# Patient Record
Sex: Female | Born: 1935 | Race: White | Hispanic: No | Marital: Married | Smoking: Former smoker
Health system: Southern US, Community
[De-identification: ages and names within clinical notes are randomized; demographics above are authoritative.]

## PROBLEM LIST (undated history)

## (undated) DIAGNOSIS — J9610 Chronic respiratory failure, unspecified whether with hypoxia or hypercapnia: Secondary | ICD-10-CM

## (undated) DIAGNOSIS — H539 Unspecified visual disturbance: Secondary | ICD-10-CM

## (undated) DIAGNOSIS — J449 Chronic obstructive pulmonary disease, unspecified: Secondary | ICD-10-CM

## (undated) DIAGNOSIS — I739 Peripheral vascular disease, unspecified: Secondary | ICD-10-CM

## (undated) DIAGNOSIS — C439 Malignant melanoma of skin, unspecified: Secondary | ICD-10-CM

## (undated) DIAGNOSIS — I10 Essential (primary) hypertension: Secondary | ICD-10-CM

## (undated) DIAGNOSIS — M19079 Primary osteoarthritis, unspecified ankle and foot: Secondary | ICD-10-CM

## (undated) DIAGNOSIS — G500 Trigeminal neuralgia: Secondary | ICD-10-CM

## (undated) HISTORY — PX: APPENDECTOMY: SHX54

## (undated) HISTORY — PX: MASTECTOMY: SHX3

## (undated) HISTORY — PX: CHOLECYSTECTOMY: SHX55

## (undated) HISTORY — DX: Essential (primary) hypertension: I10

## (undated) HISTORY — DX: Chronic obstructive pulmonary disease, unspecified: J44.9

## (undated) HISTORY — PX: TOTAL ABDOMINAL HYSTERECTOMY: SHX209

## (undated) HISTORY — DX: Unspecified visual disturbance: H53.9

## (undated) HISTORY — DX: Malignant melanoma of skin, unspecified: C43.9

## (undated) HISTORY — DX: Primary osteoarthritis, unspecified ankle and foot: M19.079

---

## 1999-06-11 ENCOUNTER — Ambulatory Visit (HOSPITAL_COMMUNITY): Admission: RE | Admit: 1999-06-11 | Discharge: 1999-06-11 | Payer: Self-pay | Admitting: Specialist

## 2004-12-04 ENCOUNTER — Ambulatory Visit: Payer: Self-pay | Admitting: Critical Care Medicine

## 2005-01-23 ENCOUNTER — Ambulatory Visit: Payer: Self-pay | Admitting: Critical Care Medicine

## 2005-01-28 ENCOUNTER — Ambulatory Visit: Payer: Self-pay | Admitting: Critical Care Medicine

## 2005-05-14 ENCOUNTER — Ambulatory Visit: Payer: Self-pay | Admitting: Critical Care Medicine

## 2005-05-28 ENCOUNTER — Ambulatory Visit: Payer: Self-pay | Admitting: Cardiology

## 2005-06-12 ENCOUNTER — Ambulatory Visit: Payer: Self-pay | Admitting: Critical Care Medicine

## 2005-09-30 ENCOUNTER — Ambulatory Visit: Payer: Self-pay | Admitting: Critical Care Medicine

## 2006-02-19 ENCOUNTER — Ambulatory Visit: Payer: Self-pay | Admitting: Critical Care Medicine

## 2006-04-09 ENCOUNTER — Ambulatory Visit: Payer: Self-pay | Admitting: Critical Care Medicine

## 2006-05-08 ENCOUNTER — Ambulatory Visit: Payer: Self-pay | Admitting: Critical Care Medicine

## 2006-05-23 ENCOUNTER — Ambulatory Visit: Payer: Self-pay | Admitting: Critical Care Medicine

## 2006-05-23 ENCOUNTER — Ambulatory Visit: Payer: Self-pay | Admitting: Internal Medicine

## 2006-06-03 ENCOUNTER — Ambulatory Visit: Payer: Self-pay | Admitting: Critical Care Medicine

## 2006-06-23 ENCOUNTER — Ambulatory Visit: Payer: Self-pay | Admitting: Critical Care Medicine

## 2006-08-08 ENCOUNTER — Ambulatory Visit: Payer: Self-pay | Admitting: Critical Care Medicine

## 2006-09-30 ENCOUNTER — Ambulatory Visit: Payer: Self-pay | Admitting: Critical Care Medicine

## 2007-05-13 DIAGNOSIS — J440 Chronic obstructive pulmonary disease with acute lower respiratory infection: Secondary | ICD-10-CM

## 2007-05-13 DIAGNOSIS — J209 Acute bronchitis, unspecified: Secondary | ICD-10-CM | POA: Insufficient documentation

## 2007-09-17 ENCOUNTER — Ambulatory Visit: Payer: Self-pay | Admitting: Critical Care Medicine

## 2007-09-17 DIAGNOSIS — I10 Essential (primary) hypertension: Secondary | ICD-10-CM

## 2007-09-17 DIAGNOSIS — C439 Malignant melanoma of skin, unspecified: Secondary | ICD-10-CM | POA: Insufficient documentation

## 2007-09-17 DIAGNOSIS — J309 Allergic rhinitis, unspecified: Secondary | ICD-10-CM | POA: Insufficient documentation

## 2007-09-19 ENCOUNTER — Encounter: Payer: Self-pay | Admitting: Critical Care Medicine

## 2007-11-02 ENCOUNTER — Encounter: Payer: Self-pay | Admitting: Critical Care Medicine

## 2007-11-02 ENCOUNTER — Ambulatory Visit: Payer: Self-pay | Admitting: Critical Care Medicine

## 2008-03-24 ENCOUNTER — Ambulatory Visit: Payer: Self-pay | Admitting: Critical Care Medicine

## 2008-03-24 DIAGNOSIS — R5381 Other malaise: Secondary | ICD-10-CM | POA: Insufficient documentation

## 2008-03-28 ENCOUNTER — Encounter: Payer: Self-pay | Admitting: Critical Care Medicine

## 2008-04-11 ENCOUNTER — Encounter: Payer: Self-pay | Admitting: Critical Care Medicine

## 2008-05-16 ENCOUNTER — Ambulatory Visit: Payer: Self-pay | Admitting: Critical Care Medicine

## 2008-08-15 ENCOUNTER — Ambulatory Visit: Payer: Self-pay | Admitting: Critical Care Medicine

## 2008-09-01 ENCOUNTER — Encounter: Payer: Self-pay | Admitting: Critical Care Medicine

## 2008-11-17 ENCOUNTER — Ambulatory Visit: Payer: Self-pay | Admitting: Critical Care Medicine

## 2009-01-18 ENCOUNTER — Encounter: Payer: Self-pay | Admitting: Critical Care Medicine

## 2009-01-19 ENCOUNTER — Encounter: Payer: Self-pay | Admitting: Critical Care Medicine

## 2009-03-02 ENCOUNTER — Ambulatory Visit: Payer: Self-pay | Admitting: Critical Care Medicine

## 2009-06-01 ENCOUNTER — Ambulatory Visit: Payer: Self-pay | Admitting: Critical Care Medicine

## 2009-10-12 ENCOUNTER — Ambulatory Visit: Payer: Self-pay | Admitting: Critical Care Medicine

## 2009-10-12 ENCOUNTER — Ambulatory Visit: Payer: Self-pay | Admitting: Radiology

## 2009-10-12 ENCOUNTER — Ambulatory Visit (HOSPITAL_BASED_OUTPATIENT_CLINIC_OR_DEPARTMENT_OTHER): Admission: RE | Admit: 2009-10-12 | Discharge: 2009-10-12 | Payer: Self-pay | Admitting: Critical Care Medicine

## 2009-10-12 DIAGNOSIS — R519 Headache, unspecified: Secondary | ICD-10-CM | POA: Insufficient documentation

## 2009-10-12 DIAGNOSIS — R51 Headache: Secondary | ICD-10-CM

## 2009-12-26 ENCOUNTER — Ambulatory Visit: Payer: Self-pay | Admitting: Critical Care Medicine

## 2010-01-02 ENCOUNTER — Ambulatory Visit: Payer: Self-pay | Admitting: Critical Care Medicine

## 2010-01-15 ENCOUNTER — Telehealth (INDEPENDENT_AMBULATORY_CARE_PROVIDER_SITE_OTHER): Payer: Self-pay | Admitting: *Deleted

## 2010-01-25 ENCOUNTER — Ambulatory Visit: Payer: Self-pay | Admitting: Critical Care Medicine

## 2010-04-18 ENCOUNTER — Telehealth (INDEPENDENT_AMBULATORY_CARE_PROVIDER_SITE_OTHER): Payer: Self-pay | Admitting: *Deleted

## 2010-05-10 ENCOUNTER — Ambulatory Visit: Payer: Self-pay | Admitting: Critical Care Medicine

## 2010-05-10 DIAGNOSIS — G500 Trigeminal neuralgia: Secondary | ICD-10-CM | POA: Insufficient documentation

## 2010-07-02 ENCOUNTER — Ambulatory Visit: Payer: Self-pay | Admitting: Pulmonary Disease

## 2010-07-02 ENCOUNTER — Telehealth (INDEPENDENT_AMBULATORY_CARE_PROVIDER_SITE_OTHER): Payer: Self-pay | Admitting: *Deleted

## 2010-07-02 DIAGNOSIS — J441 Chronic obstructive pulmonary disease with (acute) exacerbation: Secondary | ICD-10-CM

## 2010-07-04 LAB — CONVERTED CEMR LAB
BUN: 20 mg/dL (ref 6–23)
CO2: 29 meq/L (ref 19–32)
Calcium: 9.4 mg/dL (ref 8.4–10.5)
Chloride: 96 meq/L (ref 96–112)
Creatinine, Ser: 0.7 mg/dL (ref 0.4–1.2)
GFR calc non Af Amer: 85.37 mL/min (ref >60.00)
Glucose, Bld: 106 mg/dL — ABNORMAL HIGH (ref 70–99)
Potassium: 4.2 meq/L (ref 3.5–5.1)
Sodium: 135 meq/L (ref 135–145)

## 2010-07-17 ENCOUNTER — Ambulatory Visit: Payer: Self-pay | Admitting: Pulmonary Disease

## 2010-08-30 NOTE — Assessment & Plan Note (Signed)
Summary: 2 wk follow up in [place] / [facility]   Visit Type:  NP follow up Primary Otho Michalik/Referring Kashawna Manzer:  Ronald Lobo in [place]  CC:  2 week NP follow up. Pt c/o wheezing, chest tightness, sore throat, SOB and right foot swelling. Needs refills on ProAir, and and Nasonex .  History of Present Illness: 77 -year-old, white female with history of chronic obstructive lung disease with asthmatic bronchitis and emphysematous component.    Gold Stage III  October 12, 2009 12:21 PM since last ov .  doing ok,  fell over christmas and busted head open.  had a coughing virus.  had to take two rounds of abx  zpak then a fluoroquinolone.   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  No fever.   Was dx with trigeminal neuralgia.   Tx w/ avelox and steroid taper.   July 02, 2010 5:13 PM  - Acute OV head cold x 3 ds with cough, dry, wheezing, increased dyspnea, feels dizzy Bp low , taking extra potassium, h/o falll in the past CXR no infx, reviewed - allergic to PCN & doxy  July 17, 2010--Returns today for follow up. Recent COPD flare , tx zithromax and steroid taper 2 weeks ago. CXR was w/ chronic changes, BMET unrevealing. Today she is feeling better not back to baseline yet. Has some post nasal drip at times w/ am sore throat. B/P had been on low side, b/p meds were held for 3 days, b/p improved and she restarted her meds without trouble.  Denies chest pain,  orthopnea, hemoptysis, fever, n/v/d, edema, headache.   Current Medications (verified): 1)  Nexium 40 Mg  Cpdr (Esomeprazole Magnesium) .... One By Mouth Once Daily 2)  Requip 2 Mg  Tabs (Ropinirole Hcl) .... One By Mouth At Bedtime 3)  Klor-Con 10 10 Meq  Tbcr (Potassium Chloride) .... 2 Tabs Once Daily 4)  Spiriva Handihaler 18 Mcg  Caps (Tiotropium Bromide Monohydrate) .... One Capsule in  Handihaler Once Daily 5)  Symbicort 160-4.5 Mcg/act  Aero (Budesonide-Formoterol Fumarate) .... Two Puffs Twice Daily 6)  Nasonex 50 Mcg/act  Susp (Mometasone Furoate) .... Two Puff Ea Nostril Once Daily 7)  Foltx 2.5-25-2 Mg  Tabs (Fa-Pyridoxine-Cyancobalamin) .... Once Daily 8)  Vivelle-Dot 0.1 Mg/24hr  Pttw (Estradiol) .... Apply Twice A Week 9)  Lipitor 10 Mg Tabs (Atorvastatin Calcium) .... Take 1 Tablet By Mouth Once A Day 10)  Lasix 20 Mg  Tabs (Furosemide) .... Take 1 Tablet By Mouth Once A Day 11)  Losartan Potassium-Hctz 100-25 Mg Tabs (Losartan Potassium-Hctz) .... One By Mouth Daily 12)  Hyoscyamine Sulfate 0.125 Mg Tabs (Hyoscyamine Sulfate) .... Take 1 Tab By Mouth Once Daily As Needed For Abdominal Cramps 13)  Meclizine Hcl 25 Mg Tabs (Meclizine Hcl) .... As Needed 14)  Lidoderm 5 % Ptch (Lidocaine) .... Use As Needed 15)  Oxycodone-Acetaminophen 5-325 Mg Tabs (Oxycodone-Acetaminophen) .... As Needed  Allergies (verified): 1)  ! Penicillin 2)  ! Doxycycline  Past History:  Past Medical History: Last updated: 03/24/2008 Current Problems:  MALIGNANT MELANOMA, SKIN (ICD-172.9) no recurrence on leg  HYPERTENSION (ICD-401.9) COPD (ICD-496)   -FeV1 51% 4/09 Allergic Rhinitis  Past Surgical History: Last updated: 09/17/2007 Appendectomy Breast Surgery: bilateral mastectomy 1980s Cholecystectomy Total Abdominal Hysterectomy  Family History: Last updated: 09/17/2007 Diabetes  Alzheimers disease  Social History: Last updated: 09/17/2007 Patient states former smoker.   Risk Factors: Smoking Status: quit > 6 months (07/02/2010)  Review of Systems      See HPI  Vital Signs:  Patient profile:   77 year old female Height:      62 inches Weight:      123 pounds BMI:     4.22 O2 Sat:      99 % on Room air Temp:     98.4 degrees F oral Pulse rate:   98 / minute BP sitting:   110 / 70  (left arm) Cuff size:   regular  Vitals Entered By: Zackery Barefoot CMA  (July 17, 2010 11:15 AM)  O2 Flow:  Room air CC: 2 week NP follow up. Pt c/o wheezing, chest tightness, sore throat, SOB and right foot swelling. Needs refills on ProAir, and Nasonex  Comments Medications reviewed with patient Verified contact number and pharmacy with patient Zackery Barefoot CMA  July 17, 2010 11:15 AM    Physical Exam  Additional Exam:  Gen: Pleasant, well nourished, in no distress ENT: no lesions,pale nasal mucosa, post pharynx clear.  Neck: No JVD, no TMG, no carotid bruits Lungs: no use of accessory muscles, no dullness to percussion, diminshed BS in bases  Cardiovascular: RRR, heart sounds normal, no murmurs or gallops, no peripheral edema Musculoskeletal: No deformities, no cyanosis or clubbing     Impression & Recommendations:  Problem # 1:  C O P D WITH ACUTE EXACERBATION (ICD-491.21)  Recent flare now resolved.  cont on same meds.   Orders: Est. Patient Level II (60454)  Problem # 2:  HYPERTENSION (ICD-401.9)  controlled on rx.  Her updated medication list for this problem includes:    Lasix 20 Mg Tabs (Furosemide) .Marland Kitchen... Take 1 tablet by mouth once a day    Losartan Potassium-hctz 100-25 Mg Tabs (Losartan potassium-hctz) ..... One by mouth daily  BP today: 110/70 Prior BP: 90/70 (07/02/2010)  Labs Reviewed: K+: 4.2 (07/02/2010) Creat: : 0.7 (07/02/2010)     Orders: Est. Patient Level II (09811)  Medications Added to Medication List This Visit: 1)  Proair Hfa 108 (90 Base) Mcg/act Aers (Albuterol sulfate) .... 2 puffs every 4-6 hrs as needed wheezing  Complete Medication List: 1)  Nexium 40 Mg Cpdr (Esomeprazole magnesium) .... One by mouth once daily 2)  Requip 2 Mg Tabs (Ropinirole hcl) .... One by mouth at bedtime 3)  Klor-con 10 10 Meq Tbcr (Potassium chloride) .... 2 tabs once daily 4)  Spiriva Handihaler 18 Mcg Caps (Tiotropium bromide monohydrate) .... One capsule in handihaler once daily 5)  Symbicort 160-4.5 Mcg/act  Aero (Budesonide-formoterol fumarate) .... Two puffs twice daily 6)  Nasonex 50 Mcg/act Susp (Mometasone furoate) .... Two puff ea nostril once daily 7)  Foltx 2.5-25-2 Mg Tabs (Fa-pyridoxine-cyancobalamin) .... Once daily 8)  Vivelle-dot 0.1 Mg/24hr Pttw (Estradiol) .... Apply twice a week 9)  Lipitor 10 Mg Tabs (Atorvastatin calcium) .... Take 1 tablet by mouth once a day 10)  Lasix 20 Mg Tabs (Furosemide) .... Take 1 tablet by mouth once a day 11)  Losartan Potassium-hctz 100-25 Mg Tabs (Losartan potassium-hctz) .... One by mouth daily 12)  Hyoscyamine Sulfate 0.125 Mg Tabs (Hyoscyamine sulfate) .... Take 1 tab by mouth once daily as needed for abdominal cramps 13)  Meclizine Hcl 25 Mg Tabs (Meclizine hcl) .... As needed 14)  Lidoderm 5 % Ptch (Lidocaine) .... Use as needed 15)  Oxycodone-acetaminophen 5-325 Mg  Tabs (Oxycodone-acetaminophen) .... As needed 16)  Proair Hfa 108 (90 Base) Mcg/act Aers (Albuterol sulfate) .... 2 puffs every 4-6 hrs as needed wheezing  Patient Instructions: 1)  Continue on same meds.  2)  follow up Dr. Vassie Loll in 3 months  and as needed -in [facility]  Prescriptions: PROAIR HFA 108 (90 BASE) MCG/ACT AERS (ALBUTEROL SULFATE) 2 puffs every 4-6 hrs as needed wheezing  #3 x 1   Entered and Authorized by:   Rubye Oaks NP   Signed by:   Rubye Oaks NP on 07/17/2010   Method used:   Electronically to        [facility] [phone]* (retail)       [address] [place], [place]  [address]       Ph: [phone] or [phone]       Fax: [phone]   RxID:   [id number] NASONEX 50 MCG/ACT  SUSP (MOMETASONE FUROATE) two puff ea nostril once daily  #3 x 3   Entered and Authorized by:   Rubye Oaks NP   Signed by:   Rubye Oaks NP on 07/17/2010   Method used:   Electronically to        [facility] [address] (retail)       [address] [place], [place]  [address]       Ph: [phone] or [phone]       Fax: [phone]   RxID:    [id number]

## 2010-08-30 NOTE — Assessment & Plan Note (Signed)
Summary: NP follow up - bronchitis   Primary Provider/Referring Provider:  Ronald Lobo in [place]  CC:  1 week follow up - states is better but still having nasal/sinus congestion and prod cough with light yellow mucus.  History of Present Illness: 77 -year-old, white female with history of chronic obstructive lung disease with asthmatic bronchitis and emphysematous component.     October 12, 2009 12:21 PM since last ov .  doing ok,  fell over christmas and busted head open.  had a coughing virus.  had to take two rounds of abx  zpak then a fluoroquinolone.   Ivin Booty was the surgeon  had a large laceration on the forehead,   passed out and hit the stool not much cough now.  no real wheeze,  no congestion now has pain in facial in R maxillary bone   Dec 26, 2009 11:11 AM This pt is more dyspneic for one week.  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   Now notes more cough, and not able to move, in bed for 3 days Note pt is on an  ace inhibitor,  notes yellow and clear mucus.  No fever.   Was dx with trigeminal neuralgia.    January 02, 2010--Presents for 1 week follow up - states is better but still having nasal/sinus congestion, prod cough with light yellow mucus.  Last vist ace inhibitor stopped. Tx w/ avelox and steroid taper. She is feeling much better but has not fully recoverd yet. Changed to losartan 50mg  -tolerating well w/ good b/p control. Denies chest pain, dyspnea, orthopnea, hemoptysis, fever, n/v/d, edema, headache. October 12, 2009 12:21 PM since last ov .  doing ok,  fell over christmas and busted head open.  had a coughing virus.  had to take two rounds of abx  zpak then a fluoroquinolone.   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  no real wheeze,  no congestion now  has pain in facial in R maxillary bone    Medications Prior to  Update: 1)  Nexium 40 Mg  Cpdr (Esomeprazole Magnesium) .... One By Mouth Once Daily 2)  Requip 2 Mg  Tabs (Ropinirole Hcl) .... One By Mouth At Bedtime 3)  Klor-Con 10 10 Meq  Tbcr (Potassium Chloride) .... 2 Tabs Once Daily 4)  Spiriva Handihaler 18 Mcg  Caps (Tiotropium Bromide Monohydrate) .... One Capsule in Handihaler Once Daily 5)  Symbicort 160-4.5 Mcg/act  Aero (Budesonide-Formoterol Fumarate) .... Two Puffs Twice Daily 6)  Nasonex 50 Mcg/act  Susp (Mometasone Furoate) .... Two Puff Ea Nostril Once Daily 7)  Meloxicam 7.5 Mg  Tabs (Meloxicam) .... Daily As Needed 8)  Foltx 2.5-25-2 Mg  Tabs (Fa-Pyridoxine-Cyancobalamin) .... Once Daily 9)  Vivelle-Dot 0.1 Mg/24hr  Pttw (Estradiol) .... Apply Twice A Week 10)  Lipitor 10 Mg Tabs (Atorvastatin Calcium) .... Take 1 Tablet By Mouth Once A Day 11)  Hydrochlorothiazide 25 Mg  Tabs (Hydrochlorothiazide) .... Once Daily 12)  Meclizine Hcl 25 Mg Tabs (Meclizine Hcl) .... As Needed 13)  Losartan Potassium 50 Mg Tabs (Losartan Potassium) .... One By Mouth Daily 14)  Oxycodone-Acetaminophen 5-325 Mg Tabs (Oxycodone-Acetaminophen) .... As Needed 15)  Lidoderm 5 % Ptch (Lidocaine) .... Use As Needed 16)  Prednisone 10 Mg  Tabs (Prednisone) .... Take As Directed 4 Each  Am X 4 Days, 3 X 4 Days, 2 X 4 Days, 1 X 4 Days Then Stop  Current Medications (verified): 1)  Nexium 40 Mg  Cpdr (Esomeprazole Magnesium) .... One By Mouth Once Daily 2)  Requip 2 Mg  Tabs (Ropinirole Hcl) .... One By Mouth At Bedtime 3)  Klor-Con 10 10 Meq  Tbcr (Potassium Chloride) .... 2 Tabs Once Daily 4)  Spiriva Handihaler 18 Mcg  Caps (Tiotropium Bromide Monohydrate) .... One Capsule in Handihaler Once Daily 5)  Symbicort 160-4.5 Mcg/act  Aero (Budesonide-Formoterol Fumarate) .... Two Puffs Twice Daily 6)  Nasonex 50 Mcg/act  Susp (Mometasone Furoate) .... Two Puff Ea Nostril Once Daily 7)  Meloxicam 7.5 Mg  Tabs (Meloxicam) .... Daily As Needed 8)  Foltx 2.5-25-2 Mg  Tabs  (Fa-Pyridoxine-Cyancobalamin) .... Once Daily 9)  Vivelle-Dot 0.1 Mg/24hr  Pttw (Estradiol) .... Apply Twice A Week 10)  Lipitor 10 Mg Tabs (Atorvastatin Calcium) .... Take 1 Tablet By Mouth Once A Day 11)  Hydrochlorothiazide 25 Mg  Tabs (Hydrochlorothiazide) .... Once Daily 12)  Meclizine Hcl 25 Mg Tabs (Meclizine Hcl) .... As Needed 13)  Losartan Potassium 50 Mg Tabs (Losartan Potassium) .... One By Mouth Daily 14)  Oxycodone-Acetaminophen 5-325 Mg Tabs (Oxycodone-Acetaminophen) .... As Needed 15)  Lidoderm 5 % Ptch (Lidocaine) .... Use As Needed 16)  Prednisone 10 Mg  Tabs (Prednisone) .... Take As Directed 4 Each Am X 4 Days, 3 X 4 Days, 2 X 4 Days, 1 X 4 Days Then Stop  Allergies (verified): 1)  ! Penicillin 2)  ! Doxycycline  Past History:  Past Medical History: Last updated: 03/24/2008 Current Problems:  MALIGNANT MELANOMA, SKIN (ICD-172.9) no recurrence on leg  HYPERTENSION (ICD-401.9) COPD (ICD-496)   -FeV1 51% 4/09 Allergic Rhinitis  Past Surgical History: Last updated: 09/17/2007 Appendectomy Breast Surgery: bilateral mastectomy 1980s Cholecystectomy Total Abdominal Hysterectomy  Family History: Last updated: 09/17/2007 Diabetes Alzheimers disease  Social History: Last updated: 09/17/2007 Patient states former smoker.   Risk Factors: Smoking Status: quit > 6 months (12/26/2009)  Review of Systems      See HPI  Vital Signs:  Patient profile:   77 year old female Height:      62 inches Weight:      119 pounds BMI:     21.84 O2 Sat:      95 % on Room air Temp:     97.0 degrees F oral Pulse rate:   69 / minute BP sitting:   122 / 74  (left arm) Cuff size:   regular  Vitals Entered By: Boone Master CNA/MA (January 02, 2010 10:04 AM)  O2 Flow:  Room air CC: 1 week follow up - states is better but still having nasal/sinus congestion, prod cough with light yellow mucus Is Patient Diabetic? No Comments Medications reviewed with patient Daytime contact  number verified with patient. Boone Master CNA/MA  January 02, 2010 10:04 AM    Physical Exam  Additional Exam:  Gen: Pleasant, well nourished, in no distress ENT: no lesions, no postnasal drip tender R lateral nares Neck: No JVD, no TMG, no carotid bruits Lungs: no use of accessory muscles, no dullness to percussion, no wheezing  Cardiovascular: RRR, heart sounds normal, no murmurs or gallops, no peripheral edema Musculoskeletal: No deformities, no cyanosis or clubbing     Impression & Recommendations:  Problem # 1:  COPD (ICD-496)  resolving exacerbation  REC:  Drinks lots of fluids.  Finsih prednisone taper  Mucinex DM  two times a day as needed cough/congestion  Saline nasal rinses as needed  Nasonex 2 puffs two times a day  Please contact office for sooner follow up if symptoms do not improve or worsen   Her updated medication list for this problem includes:    Spiriva Handihaler 18 Mcg Caps (Tiotropium bromide monohydrate) ..... One capsule in handihaler once daily    Symbicort 160-4.5 Mcg/act Aero (Budesonide-formoterol fumarate) .Marland Kitchen..Marland Kitchen Two puffs twice daily  Orders: Est. Patient Level II (16109)  Problem # 2:  HYPERTENSION (ICD-401.9)  controllled on new meds-losartan  would avoid ace inhibitors in future.  Her updated medication list for this problem includes:    Hydrochlorothiazide 25 Mg Tabs (Hydrochlorothiazide) ..... Once daily    Losartan Potassium 50 Mg Tabs (Losartan potassium) ..... One by mouth daily  Orders: Est. Patient Level II (60454)  Complete Medication List: 1)  Nexium 40 Mg Cpdr (Esomeprazole magnesium) .... One by mouth once daily 2)  Requip 2 Mg Tabs (Ropinirole hcl) .... One by mouth at bedtime 3)  Klor-con 10 10 Meq Tbcr (Potassium chloride) .... 2 tabs once daily 4)  Spiriva Handihaler 18 Mcg Caps (Tiotropium bromide monohydrate) .... One capsule in handihaler once daily 5)  Symbicort 160-4.5 Mcg/act Aero (Budesonide-formoterol fumarate)  .... Two puffs twice daily 6)  Nasonex 50 Mcg/act Susp (Mometasone furoate) .... Two puff ea nostril once daily 7)  Meloxicam 7.5 Mg Tabs (Meloxicam) .... Daily as needed 8)  Foltx 2.5-25-2 Mg Tabs (Fa-pyridoxine-cyancobalamin) .... Once daily 9)  Vivelle-dot 0.1 Mg/24hr Pttw (Estradiol) .... Apply twice a week 10)  Lipitor 10 Mg Tabs (Atorvastatin calcium) .... Take 1 tablet by mouth once a day 11)  Hydrochlorothiazide 25 Mg Tabs (Hydrochlorothiazide) .... Once daily 12)  Meclizine Hcl 25 Mg Tabs (Meclizine hcl) .... As needed 13)  Losartan Potassium 50 Mg Tabs (Losartan potassium) .... One by mouth daily 14)  Oxycodone-acetaminophen 5-325 Mg Tabs (Oxycodone-acetaminophen) .... As needed 15)  Lidoderm 5 % Ptch (Lidocaine) .... Use as needed 16)  Prednisone 10 Mg Tabs (Prednisone) .... Take as directed 4 each am x 4 days, 3 x 4 days, 2 x 4 days, 1 x 4 days then stop  Patient Instructions: 1)  Drinks lots of fluids.  2)  Finsih prednisone taper  3)  Mucinex DM two times a day as needed cough/congestion  4)  Saline nasal rinses as needed  5)  Nasonex 2 puffs two times a day  6)  Please contact office for sooner follow up if symptoms do not improve or worsen  Prescriptions: SYMBICORT 160-4.5 MCG/ACT  AERO (BUDESONIDE-FORMOTEROL FUMARATE) Two puffs twice daily  #3 x 3   Entered and Authorized by:   Rubye Oaks NP   Signed by:   Sally Reimers NP on 01/02/2010   Method used:   Print then Give to Patient   RxID:   [id number]

## 2010-08-30 NOTE — Assessment & Plan Note (Signed)
Summary: Pulmonary OV   Primary Provider/Referring Provider:  Ronald Lobo in [place]  CC:  COPD. The patient states she is having no breathing problems..  History of Present Illness: Pulmonary OV  This is a 77 year old, white female with history of chronic obstructive lung disease with asthmatic bronchitis and emphysematous component.     October 12, 2009 12:21 PM since last ov .  doing ok,  fell over christmas and busted head open.  had a coughing virus.  had to take two rounds of abx  zpak then a fluoroquinolone.   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  no real wheeze,  no congestion now  has pain in facial in R maxillary bone  October 12, 2009 12:21 PM since last ov .  doing ok,  fell over christmas and busted head open.  had a coughing virus.  had to take two rounds of abx  zpak then a fluoroquinolone.   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  no real wheeze,  no congestion now  has pain in facial in R maxillary bone    Preventive Screening-Counseling & Management  Alcohol-Tobacco     Smoking Status: quit > 6 months  Current Medications (verified): 1)  Nexium 40 Mg  Cpdr (Esomeprazole Magnesium) .... One By Mouth Once Daily 2)  Requip 2 Mg  Tabs (Ropinirole Hcl) .... One By Mouth At Bedtime 3)  Klor-Con 10 10 Meq  Tbcr (Potassium Chloride) .... 3 By Mouth Once Daily 4)  Spiriva Handihaler 18 Mcg  Caps (Tiotropium Bromide Monohydrate) .... One Capsule in Handihaler Once Daily 5)  Qvar 80 Mcg/act  Aers (Beclomethasone Dipropionate) .... Three Puff Two Times A Day 6)  Nasonex 50 Mcg/act  Susp (Mometasone Furoate) .... Two Puff Ea Nostril Once Daily 7)  Meloxicam 7.5 Mg  Tabs (Meloxicam) .... Daily As Needed 8)  Foltx 2.5-25-2 Mg  Tabs (Fa-Pyridoxine-Cyancobalamin) .... Once Daily 9)  Vivelle-Dot 0.1  Mg/24hr  Pttw (Estradiol) .... Apply Twice A Week 10)  Lipitor 10 Mg Tabs (Atorvastatin Calcium) .... Take 1 Tablet By Mouth Once A Day 11)  Hydrochlorothiazide 25 Mg  Tabs (Hydrochlorothiazide) .... Once Daily 12)  Meclizine Hcl 25 Mg Tabs (Meclizine Hcl) .... As Needed 13)  Quinapril Hcl 10 Mg Tabs (Quinapril Hcl) .... Take 1 Tablet By Mouth Once A Day 14)  Oxycodone-Acetaminophen 5-325 Mg Tabs (Oxycodone-Acetaminophen) .... As Needed 15)  Tramadol Hcl 50 Mg Tabs (Tramadol Hcl) .... Take One By Mouth Qid As Needed  Allergies (verified): 1)  ! Penicillin 2)  ! Doxycycline  Past History:  Past medical, surgical, family and social histories (including risk factors) reviewed, and no changes noted (except as noted below).  Past Medical History: Reviewed history from 03/24/2008 and no changes required. Current Problems:  MALIGNANT MELANOMA, SKIN (ICD-172.9) no recurrence on leg  HYPERTENSION (ICD-401.9) COPD (ICD-496)   -FeV1 51% 4/09 Allergic Rhinitis  Past Surgical History: Reviewed history from 09/17/2007 and no changes required. Appendectomy Breast Surgery: bilateral mastectomy 1980s Cholecystectomy Total Abdominal Hysterectomy  Family History: Reviewed history from 09/17/2007 and no changes required. Diabetes Alzheimers disease  Social History: Reviewed history from 09/17/2007 and no changes required. Patient states former smoker.   Review of Systems       The patient  complains of shortness of breath with activity.  The patient denies shortness of breath at rest, productive cough, non-productive cough, coughing up blood, chest pain, irregular heartbeats, acid heartburn, indigestion, loss of appetite, weight change, abdominal pain, difficulty swallowing, sore throat, tooth/dental problems, headaches, nasal congestion/difficulty breathing through nose, sneezing, itching, ear ache, anxiety, depression, hand/feet swelling, joint stiffness or pain, rash, change in color of  mucus, and fever.    Vital Signs:  Patient profile:   77 year old female Height:      62 inches (157.48 cm) Weight:      124 pounds (56.36 kg) BMI:     22.76 O2 Sat:      94 % on Room air Temp:     97.6 degrees F (36.44 degrees C) oral Pulse rate:   82 / minute BP standing:   120 / 80  (left arm) Cuff size:   regular  Vitals Entered By: Michel Bickers CMA (October 12, 2009 12:00 PM)  O2 Sat at Rest %:  94 O2 Flow:  Room air  Physical Exam  Additional Exam:  Gen: Pleasant, well nourished, in no distress ENT: no lesions, no postnasal drip tender R lateral nares Neck: No JVD, no TMG, no carotid bruits Lungs: no use of accessory muscles, no dullness to percussion, prolonged exp phase  Cardiovascular: RRR, heart sounds normal, no murmurs or gallops, no peripheral edema Musculoskeletal: No deformities, no cyanosis or clubbing     CT Scan  Procedure date:  10/12/2009  Findings:      Findings: No fracture.  Specifically, no fracture is seen along the second division of the right fifth cranial nerve. Vascular calcifications.  Visualized intracranial structures otherwise unremarkable.   IMPRESSION: No fracture  .Exam Type: CT of maxillary/facial bones    Impression & Recommendations:  Problem # 1:  FACIAL PAIN (ICD-784.0) Assessment Unchanged Poss fx of R maxillary bone was ruled out wiht CT of maxillofacial bones today plan as needed nsaids/tylenol The following medications were removed from the medication list:    Tramadol Hcl 50 Mg Tabs (Tramadol hcl) .Marland Kitchen... Take one by mouth qid as needed Her updated medication list for this problem includes:    Meloxicam 7.5 Mg Tabs (Meloxicam) .Marland Kitchen... Daily as needed    Oxycodone-acetaminophen 5-325 Mg Tabs (Oxycodone-acetaminophen) .Marland Kitchen... As needed  Orders: Est. Patient Level III (45409) Radiology Referral (Radiology)  Problem # 2:  COPD (ICD-496) Assessment: Unchanged   chronic obstructive lung disease with primary emphysematous  component stable at this time. Golds stage II  plan  maintain inhaled medications at current dose level.  Medications Added to Medication List This Visit: 1)  Lidoderm 5 % Ptch (Lidocaine) .... Use as needed  Complete Medication List: 1)  Nexium 40 Mg Cpdr (Esomeprazole magnesium) .... One by mouth once daily 2)  Requip 2 Mg Tabs (Ropinirole hcl) .... One by mouth at bedtime 3)  Klor-con 10 10 Meq Tbcr (Potassium chloride) .... 3 by mouth once daily 4)  Spiriva Handihaler 18 Mcg Caps (Tiotropium bromide monohydrate) .... One capsule in handihaler once daily 5)  Qvar 80 Mcg/act Aers (Beclomethasone dipropionate) .... Three puff two times a day 6)  Nasonex 50 Mcg/act Susp (Mometasone furoate) .... Two puff ea nostril once daily 7)  Meloxicam 7.5 Mg Tabs (Meloxicam) .... Daily as needed 8)  Foltx 2.5-25-2 Mg Tabs (Fa-pyridoxine-cyancobalamin) .... Once daily 9)  Vivelle-dot 0.1 Mg/24hr Pttw (Estradiol) .... Apply twice a week 10)  Lipitor 10 Mg Tabs (Atorvastatin calcium) .... Take 1  tablet by mouth once a day 11)  Hydrochlorothiazide 25 Mg Tabs (Hydrochlorothiazide) .... Once daily 12)  Meclizine Hcl 25 Mg Tabs (Meclizine hcl) .... As needed 13)  Quinapril Hcl 10 Mg Tabs (Quinapril hcl) .... Take 1 tablet by mouth once a day 14)  Oxycodone-acetaminophen 5-325 Mg Tabs (Oxycodone-acetaminophen) .... As needed 15)  Lidoderm 5 % Ptch (Lidocaine) .... Use as needed  Patient Instructions: 1)  No change in medications 2)  A CT scan of the face will be obtained   Appended Document: Pulmonary OV fax [facility]

## 2010-08-30 NOTE — Assessment & Plan Note (Signed)
Summary: Pulmonary OV   Primary Provider/Referring Provider:  Ronald Lobo in [place]  CC:  4 month followup, sob in am when waking up, and exertion, denies coughing, and pt had flu vaccine 2 weeks ago at [facility] in [place].  History of Present Illness: 77 -year-old, white female with history of chronic obstructive lung disease with asthmatic bronchitis and emphysematous component.    Golds Stage III   October 12, 2009 12:21 PM since last ov .  doing ok,  fell over christmas and busted head open.  had a coughing virus.  had to take two rounds of abx  zpak then a fluoroquinolone.   Ivin Booty was the surgeon  had a large laceration on the forehead,   passed out and hit the stool not much cough now.  no real wheeze,  no congestion now has pain in facial in R maxillary bone   Dec 26, 2009 11:11 AM This pt is more dyspneic for one week.  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   Now notes more cough, and not able to move, in bed for 3 days Note pt is on an  ace inhibitor,  notes yellow and clear mucus.  No fever.   Was dx with trigeminal neuralgia.    January 02, 2010--Presents for 1 week follow up - states is better but still having nasal/sinus congestion, prod cough with light yellow mucus.  Last vist ace inhibitor stopped. Tx w/ avelox and steroid taper. She is feeling much better but has not fully recoverd yet. Changed to losartan 50mg  -tolerating well w/ good b/p control. Denies chest pain, dyspnea, orthopnea, hemoptysis, fever, n/v/d, edema, headache. January 25, 2010 10:47 AM Better vs last ov.   No sinus now.  Now cough.  Better with dyspnea.   off meloxicam. had nausea and vomiting with meloxicam Pt denies any significant sore throat, nasal congestion or excess secretions, fever, chills, sweats, unintended weight loss, pleurtic or exertional chest pain, orthopnea PND, or leg swelling Pt denies any increase in rescue therapy over baseline,  denies waking up needing it or having any early am or nocturnal exacerbations of coughing/wheezing/or dyspnea.   May 10, 2010 12:20 PM Overall doing well.   Pt denies any significant sore throat, nasal congestion or excess secretions, fever, chills, sweats, unintended weight loss, pleurtic or exertional chest pain, orthopnea PND, or leg swelling Pt denies any increase in rescue therapy over baseline, denies waking up needing it or having any early am or nocturnal exacerbations of coughing/wheezing/or dyspnea. Pt also denies any obvious fluctuation in symptoms wiht weather or environmental change or other alleviating or aggravating factors   Preventive Screening-Counseling & Management  Alcohol-Tobacco     Smoking Status: quit > 6 months  Pulmonary Function Test Date: 05/10/2010 Height (in.): 62 Gender: Female  Pre-Spirometry FVC    Value: 2.13 L/min   Pred: 2.50 L/min     % Pred: 85 % FEV1    Value: 1.04 L     Pred: 1.74 L     % Pred: 59 % FEV1/FVC  Value: 49 %     Pred: 71 %     % Pred: 69 % FEF 25-75  Value: 0.42 L/min   Pred: 2.05 L/min     % Pred: 20 %  Current Medications (verified): 1)  Nexium 40 Mg  Cpdr (Esomeprazole Magnesium) .... One By Mouth Once Daily 2)  Requip 2 Mg  Tabs (Ropinirole Hcl) .... One By Mouth At Bedtime 3)  Klor-Con 10 10 Meq  Tbcr (Potassium Chloride) .... 2 Tabs Once Daily 4)  Spiriva Handihaler 18 Mcg  Caps (Tiotropium Bromide Monohydrate) .... One Capsule in Handihaler Once Daily 5)  Symbicort 160-4.5 Mcg/act  Aero (Budesonide-Formoterol Fumarate) .... Two Puffs Twice Daily 6)  Nasonex 50 Mcg/act  Susp (Mometasone Furoate) .... Two Puff Ea Nostril Once Daily 7)  Foltx 2.5-25-2 Mg  Tabs (Fa-Pyridoxine-Cyancobalamin) .... Once Daily 8)  Vivelle-Dot 0.1 Mg/24hr  Pttw (Estradiol) .... Apply Twice A Week 9)  Lipitor 10 Mg Tabs (Atorvastatin Calcium) .... Take 1 Tablet By Mouth Once A Day 10)  Meclizine Hcl 25 Mg Tabs (Meclizine Hcl) .... As  Needed 11)  Oxycodone-Acetaminophen 5-325 Mg Tabs (Oxycodone-Acetaminophen) .... As Needed 12)  Lidoderm 5 % Ptch (Lidocaine) .... Use As Needed 13)  Lasix 20 Mg  Tabs (Furosemide) .... By Mouth 14)  Losartan Potassium-Hctz 100-25 Mg Tabs (Losartan Potassium-Hctz) .... One By Mouth Daily  Allergies: 1)  ! Penicillin 2)  ! Doxycycline  Past History:  Past medical, surgical, family and social histories (including risk factors) reviewed, and no changes noted (except as noted below).  Past Medical History: Reviewed history from 03/24/2008 and no changes required. Current Problems:  MALIGNANT MELANOMA, SKIN (ICD-172.9) no recurrence on leg  HYPERTENSION (ICD-401.9) COPD (ICD-496)   -FeV1 51% 4/09 Allergic Rhinitis  Past Surgical History: Reviewed history from 09/17/2007 and no changes required. Appendectomy Breast Surgery: bilateral mastectomy 1980s Cholecystectomy Total Abdominal Hysterectomy  Family History: Reviewed history from 09/17/2007 and no changes required. Diabetes Alzheimers disease  Social History: Reviewed history from 09/17/2007 and no changes required. Patient states former smoker.   Review of Systems       The patient complains of shortness of breath with activity.  The patient denies shortness of breath at rest, productive cough, non-productive cough, coughing up blood, chest pain, irregular heartbeats, acid heartburn, indigestion, loss of appetite, weight change, abdominal pain, difficulty swallowing, sore throat, tooth/dental problems, headaches, nasal congestion/difficulty breathing through nose, sneezing, itching, ear ache, anxiety, depression, hand/feet swelling, joint stiffness or pain, rash, change in color of mucus, and fever.    Vital Signs:  Patient profile:   77 year old female Height:      62 inches Weight:      122 pounds BMI:     22.39 O2 Sat:      99 % on Room air Temp:     98.5 degrees F oral Pulse rate:   76 / minute BP sitting:   142  / 78  (left arm) Cuff size:   regular  Vitals Entered By: Kandice Hams CMA (May 10, 2010 12:01 PM)  O2 Flow:  Room air  Clinical Reports Reviewed:  PFT's:  11/02/2007: FEF 25/75 %Predicted:  13 FEV1 %Predicted:  51 FEV1/FVC %Predicted:  79 FVC %Predicted:  97  09/19/2007: FEF 25/75 %Predicted:  17 FEV1 %Predicted:  62 FEV1/FVC %Predicted:  67 FVC %Predicted:  91   Immunization History:  Influenza Immunization History:    Influenza:  fluvax 3+ (04/30/2010)  CC: 4 month followup, sob in am when waking up, and exertion, denies coughing, pt [facility] [place] reviewed with patient pharmacy verified with patient   Physical Exam  Additional Exam:  Gen: Pleasant, well nourished, in no distress ENT: no lesions, no postnasal drip tender R lateral nares Neck: No JVD, no TMG, no carotid bruits Lungs:  no use of accessory muscles, no dullness to percussion, no wheezing  Cardiovascular: RRR, heart sounds normal, no murmurs or gallops, no peripheral edema Musculoskeletal: No deformities, no cyanosis or clubbing     Pulmonary Function Test Date: 05/10/2010 Height (in.): 62 Gender: Female  Pre-Spirometry FVC    Value: 2.13 L/min   Pred: 2.50 L/min     % Pred: 85 % FEV1    Value: 1.04 L     Pred: 1.74 L     % Pred: 59 % FEV1/FVC  Value: 49 %     Pred: 71 %     % Pred: 69 % FEF 25-75  Value: 0.42 L/min   Pred: 2.05 L/min     % Pred: 20 %  Impression & Recommendations:  Problem # 1:  COPD (ICD-496) Assessment Improved Golds stage II Copd with improve FeV1 since 2009,  now up to 59% predicted plan No change in inhaled medications.   Maintain treatment program as currently prescribed.  Medications Added to Medication List This Visit: 1)  Hyoscyamine Sulfate 0.125 Mg Tabs (Hyoscyamine sulfate) .... Take 1 tab by mouth once daily as needed for abdominal cramps  Complete Medication List: 1)  Nexium 40 Mg Cpdr  (Esomeprazole magnesium) .... One by mouth once daily 2)  Requip 2 Mg Tabs (Ropinirole hcl) .... One by mouth at bedtime 3)  Klor-con 10 10 Meq Tbcr (Potassium chloride) .... 2 tabs once daily 4)  Spiriva Handihaler 18 Mcg Caps (Tiotropium bromide monohydrate) .... One capsule in handihaler once daily 5)  Symbicort 160-4.5 Mcg/act Aero (Budesonide-formoterol fumarate) .... Two puffs twice daily 6)  Nasonex 50 Mcg/act Susp (Mometasone furoate) .... Two puff ea nostril once daily 7)  Foltx 2.5-25-2 Mg Tabs (Fa-pyridoxine-cyancobalamin) .... Once daily 8)  Vivelle-dot 0.1 Mg/24hr Pttw (Estradiol) .... Apply twice a week 9)  Lipitor 10 Mg Tabs (Atorvastatin calcium) .... Take 1 tablet by mouth once a day 10)  Lasix 20 Mg Tabs (Furosemide) .... By mouth 11)  Losartan Potassium-hctz 100-25 Mg Tabs (Losartan potassium-hctz) .... One by mouth daily 12)  Hyoscyamine Sulfate 0.125 Mg Tabs (Hyoscyamine sulfate) .... Take 1 tab by mouth once daily as needed for abdominal cramps 13)  Meclizine Hcl 25 Mg Tabs (Meclizine hcl) .... As needed 14)  Lidoderm 5 % Ptch (Lidocaine) .... Use as needed 15)  Oxycodone-acetaminophen 5-325 Mg Tabs (Oxycodone-acetaminophen) .... As needed  Other Orders: Est. Patient Level III (91478) Spirometry w/Graph (94010)  Patient Instructions: 1)  No change in medications 2)  Return in      4    months  Prevention & Chronic Care Immunizations   Influenza vaccine: Fluvax 3+  (04/30/2010)    Tetanus booster: Not documented    Pneumococcal vaccine: Not documented    H. zoster vaccine: Not documented  Colorectal Screening   Hemoccult: Not documented    Colonoscopy: Not documented  Other Screening   Pap smear: Not documented    Mammogram: Not documented    DXA bone density scan: Not documented   Smoking status: quit > 6 months  (05/10/2010)  Lipids   Total Cholesterol: Not documented   LDL: Not documented   LDL Direct: Not documented   HDL: Not documented    Triglycerides: Not documented  Hypertension   Last Blood Pressure: 142 / 78  (05/10/2010)   Serum creatinine: Not documented   Serum potassium Not documented  Self-Management Support :    Hypertension self-management support: Not documented  Appended Document: Pulmonary OV fax  [employer]

## 2010-08-30 NOTE — Assessment & Plan Note (Signed)
Summary: Pulmonary OV   Primary Provider/Referring Provider:  Ronald Lobo in [place]  CC:  1 month follow up.  Pt states breathing is "ok."  States she is having chest tightness "right much."  States sinus/nasal congestion and cough have resolved.  Marland Kitchen  History of Present Illness: 77 -year-old, white female with history of chronic obstructive lung disease with asthmatic bronchitis and emphysematous component.     October 12, 2009 12:21 PM since last ov .  doing ok,  fell over christmas and busted head open.  had a coughing virus.  had to take two rounds of abx  zpak then a fluoroquinolone.   Ivin Booty was the surgeon  had a large laceration on the forehead,   passed out and hit the stool not much cough now.  no real wheeze,  no congestion now has pain in facial in R maxillary bone   Dec 26, 2009 11:11 AM This pt is more dyspneic for one week.  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   Now notes more cough, and not able to move, in bed for 3 days Note pt is on an  ace inhibitor,  notes yellow and clear mucus.  No fever.   Was dx with trigeminal neuralgia.    January 02, 2010--Presents for 1 week follow up - states is better but still having nasal/sinus congestion, prod cough with light yellow mucus.  Last vist ace inhibitor stopped. Tx w/ avelox and steroid taper. She is feeling much better but has not fully recoverd yet. Changed to losartan 50mg  -tolerating well w/ good b/p control. Denies chest pain, dyspnea, orthopnea, hemoptysis, fever, n/v/d, edema, headache. January 25, 2010 10:47 AM Better vs last ov.   No sinus now.  Now cough.  Better with dyspnea.   off meloxicam. had nausea and vomiting with meloxicam Pt denies any significant sore throat, nasal congestion or excess secretions, fever, chills, sweats, unintended weight loss, pleurtic or exertional chest pain, orthopnea PND, or leg swelling Pt denies any increase in rescue therapy over  baseline, denies waking up needing it or having any early am or nocturnal exacerbations of coughing/wheezing/or dyspnea. October 12, 2009 12:21 PM since last ov .  doing ok,  fell over christmas and busted head open.  had a coughing virus.  had to take two rounds of abx  zpak then a fluoroquinolone.   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  no real wheeze,  no congestion now  has pain in facial in R maxillary bone    Preventive Screening-Counseling & Management  Alcohol-Tobacco     Smoking Status: quit > 6 months     Year Quit: 1996     Pack years: 65  Current Medications (verified): 1)  Nexium 40 Mg  Cpdr (Esomeprazole Magnesium) .... One By Mouth Once Daily 2)  Requip 2 Mg  Tabs (Ropinirole Hcl) .... One By Mouth At Bedtime 3)  Klor-Con 10 10 Meq  Tbcr (Potassium Chloride) .... 2 Tabs Once Daily 4)  Spiriva Handihaler 18 Mcg  Caps (Tiotropium Bromide Monohydrate) .... One Capsule in Handihaler Once Daily 5)  Symbicort 160-4.5 Mcg/act  Aero (Budesonide-Formoterol Fumarate) .... Two Puffs Twice Daily 6)  Nasonex 50 Mcg/act  Susp (Mometasone Furoate) .... Two Puff Ea Nostril Once Daily 7)  Foltx 2.5-25-2 Mg  Tabs (Fa-Pyridoxine-Cyancobalamin) .... Once Daily 8)  Vivelle-Dot 0.1 Mg/24hr  Pttw (Estradiol) .... Apply Twice A Week 9)  Lipitor 10 Mg Tabs (Atorvastatin Calcium) .... Take 1 Tablet By Mouth Once A Day 10)  Hydrochlorothiazide 25 Mg  Tabs (Hydrochlorothiazide) .... Once Daily 11)  Meclizine Hcl 25 Mg Tabs (Meclizine Hcl) .... As Needed 12)  Oxycodone-Acetaminophen 5-325 Mg Tabs (Oxycodone-Acetaminophen) .... As Needed 13)  Lidoderm 5 % Ptch (Lidocaine) .... Use As Needed  Allergies (verified): 1)  ! Penicillin 2)  ! Doxycycline  Past History:  Past medical, surgical, family and social histories (including risk factors) reviewed, and no changes noted (except as noted below).  Past  Medical History: Reviewed history from 03/24/2008 and no changes required. Current Problems:  MALIGNANT MELANOMA, SKIN (ICD-172.9) no recurrence on leg  HYPERTENSION (ICD-401.9) COPD (ICD-496)   -FeV1 51% 4/09 Allergic Rhinitis  Past Surgical History: Reviewed history from 09/17/2007 and no changes required. Appendectomy Breast Surgery: bilateral mastectomy 1980s Cholecystectomy Total Abdominal Hysterectomy  Family History: Reviewed history from 09/17/2007 and no changes required. Diabetes Alzheimers disease  Social History: Reviewed history from 09/17/2007 and no changes required. Patient states former smoker.   Review of Systems       The patient complains of shortness of breath with activity.  The patient denies shortness of breath at rest, productive cough, non-productive cough, coughing up blood, chest pain, irregular heartbeats, acid heartburn, indigestion, loss of appetite, weight change, abdominal pain, difficulty swallowing, sore throat, tooth/dental problems, headaches, nasal congestion/difficulty breathing through nose, sneezing, itching, ear ache, anxiety, depression, hand/feet swelling, joint stiffness or pain, rash, change in color of mucus, and fever.    Vital Signs:  Patient profile:   77 year old female Height:      62 inches Weight:      119 pounds BMI:     21.84 O2 Sat:      93 % on Room air Temp:     97.8 degrees F oral Pulse rate:   78 / minute BP sitting:   144 / 80  (left arm) Cuff size:   regular  Vitals Entered By: Gweneth Dimitri RN (January 25, 2010 10:36 AM)  O2 Flow:  Room air CC: 1 month follow up.  Pt states breathing is "ok."  States she is having chest tightness "right much."  States sinus/nasal congestion and cough have resolved.   Comments Medications reviewed with patient Daytime contact number verified with patient. Gweneth Dimitri RN  January 25, 2010 10:36 AM    Physical Exam  Additional Exam:  Gen: Pleasant, well nourished, in no  distress ENT: no lesions, no postnasal drip tender R lateral nares Neck: No JVD, no TMG, no carotid bruits Lungs: no use of accessory muscles, no dullness to percussion, no wheezing  Cardiovascular: RRR, heart sounds normal, no murmurs or gallops, no peripheral edema Musculoskeletal: No deformities, no cyanosis or clubbing     Impression & Recommendations:  Problem # 1:  COPD (ICD-496) Assessment Improved stable copd golds stage III plan No change in inhaled medications.   Maintain treatment program as currently prescribed.  Problem # 2:  HYPERTENSION (ICD-401.9) Assessment: Unchanged pt desires refill on ARB.  cannot afford diovan plan change diovan to losartan 100mg  and put HCTZ in the same tab 25mg  and d/c hctz stand alone rx  The following medications were removed from the medication list:    Hydrochlorothiazide 25 Mg Tabs (Hydrochlorothiazide) ..... Once daily    Losartan Potassium 50 Mg Tabs (Losartan potassium) .Marland KitchenMarland KitchenMarland KitchenMarland Kitchen  One by mouth daily Her updated medication list for this problem includes:    Lasix 20 Mg Tabs (Furosemide) ..... By mouth    Losartan Potassium-hctz 100-25 Mg Tabs (Losartan potassium-hctz) ..... One by mouth daily  Orders: Est. Patient Level III (84696)  Medications Added to Medication List This Visit: 1)  Lasix 20 Mg Tabs (Furosemide) .... By mouth 2)  Losartan Potassium-hctz 100-25 Mg Tabs (Losartan potassium-hctz) .... One by mouth daily  Complete Medication List: 1)  Nexium 40 Mg Cpdr (Esomeprazole magnesium) .... One by mouth once daily 2)  Requip 2 Mg Tabs (Ropinirole hcl) .... One by mouth at bedtime 3)  Klor-con 10 10 Meq Tbcr (Potassium chloride) .... 2 tabs once daily 4)  Spiriva Handihaler 18 Mcg Caps (Tiotropium bromide monohydrate) .... One capsule in handihaler once daily 5)  Symbicort 160-4.5 Mcg/act Aero (Budesonide-formoterol fumarate) .... Two puffs twice daily 6)  Nasonex 50 Mcg/act Susp (Mometasone furoate) .... Two puff ea nostril  once daily 7)  Foltx 2.5-25-2 Mg Tabs (Fa-pyridoxine-cyancobalamin) .... Once daily 8)  Vivelle-dot 0.1 Mg/24hr Pttw (Estradiol) .... Apply twice a week 9)  Lipitor 10 Mg Tabs (Atorvastatin calcium) .... Take 1 tablet by mouth once a day 10)  Meclizine Hcl 25 Mg Tabs (Meclizine hcl) .... As needed 11)  Oxycodone-acetaminophen 5-325 Mg Tabs (Oxycodone-acetaminophen) .... As needed 12)  Lidoderm 5 % Ptch (Lidocaine) .... Use as needed 13)  Lasix 20 Mg Tabs (Furosemide) .... By mouth 14)  Losartan Potassium-hctz 100-25 Mg Tabs (Losartan potassium-hctz) .... One by mouth daily  Patient Instructions: 1)  When you refill losartan, it will be losartan 100mg  and 25mg  HCTZ combined , so stop HCTZ when you start new losartan. 2)  No other medication changes 3)  Return 4 months [place] Prescriptions: LOSARTAN POTASSIUM-HCTZ 100-25 MG TABS (LOSARTAN POTASSIUM-HCTZ) one by mouth daily  #90 x 6   Entered and Authorized by:   Storm Frisk MD   Signed by:   Storm Frisk MD on 01/25/2010   Method used:   [facility]        [facility] [phone][facility] (retail)       [address] [place], [place]  [address]       Ph: [phone] or [phone]       Fax: [phone]   RxID:   [id number]   Appended Document: Pulmonary OV fax Ronald Lobo

## 2010-08-30 NOTE — Miscellaneous (Signed)
Summary: PFTs 2009  Clinical Lists Changes FEF 25/75 %Predicted:  17 FEV1 %Predicted:  62 FEV1/FVC %Predicted:  67 FVC %Predicted:  91

## 2010-08-30 NOTE — Assessment & Plan Note (Signed)
Summary: Pulmonary OV   Visit Type:  Sick visit Primary Anitria Andon/Referring Filomena Pokorney:  Ronald Lobo in [place]  CC:  Dr. Lynelle Doctor patient. Increased SOB starting Saturday, wheezing starting yesterday, chest tightness, hot flashes and chills, and nausea. Pt wants to discuss side effects of Nasonex .  History of Present Illness: 77 -year-old, white female with history of chronic obstructive lung disease with asthmatic bronchitis and emphysematous component.    Gold Stage III  October 12, 2009 12:21 PM since last ov .  doing ok,  fell over christmas and busted head open.  had a coughing virus.  had to take two rounds of abx  zpak then a fluoroquinolone.   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  No fever.   Was dx with trigeminal neuralgia.   Tx w/ avelox and steroid taper.   July 02, 2010 5:13 PM  - Acute OV head cold x 3 ds with cough, dry, wheezing, increased dyspnea, feels dizzy Bp low , taking extra potassium, h/o falll in the past CXR no infx, reviewed - allergic to PCN & doxy   Preventive Screening-Counseling & Management  Alcohol-Tobacco     Smoking Status: quit > 6 months     Year Quit: 1996     Pack years: 49  Current Medications (verified): 1)  Nexium 40 Mg  Cpdr (Esomeprazole Magnesium) .... One By Mouth Once Daily 2)  Requip 2 Mg  Tabs (Ropinirole Hcl) .... One By Mouth At Bedtime 3)  Klor-Con 10 10 Meq  Tbcr (Potassium Chloride) .... 2 Tabs Once Daily 4)  Spiriva Handihaler 18 Mcg  Caps (Tiotropium Bromide Monohydrate) .... One Capsule in Handihaler Once Daily 5)  Symbicort 160-4.5 Mcg/act  Aero (Budesonide-Formoterol Fumarate) .... Two Puffs Twice Daily 6)  Nasonex 50 Mcg/act  Susp (Mometasone Furoate) .... Two Puff Ea Nostril Once Daily 7)  Foltx 2.5-25-2 Mg  Tabs (Fa-Pyridoxine-Cyancobalamin) .... Once Daily 8)   Vivelle-Dot 0.1 Mg/24hr  Pttw (Estradiol) .... Apply Twice A Week 9)  Lipitor 10 Mg Tabs (Atorvastatin Calcium) .... Take 1 Tablet By Mouth Once A Day 10)  Lasix 20 Mg  Tabs (Furosemide) .... Take 1 Tablet By Mouth Once A Day 11)  Losartan Potassium-Hctz 100-25 Mg Tabs (Losartan Potassium-Hctz) .... One By Mouth Daily 12)  Hyoscyamine Sulfate 0.125 Mg Tabs (Hyoscyamine Sulfate) .... Take 1 Tab By Mouth Once Daily As Needed For Abdominal Cramps 13)  Meclizine Hcl 25 Mg Tabs (Meclizine Hcl) .... As Needed 14)  Lidoderm 5 % Ptch (Lidocaine) .... Use As Needed 15)  Oxycodone-Acetaminophen 5-325 Mg Tabs (Oxycodone-Acetaminophen) .... As Needed  Allergies (verified): 1)  ! Penicillin 2)  ! Doxycycline  Past History:  Past Medical History: Last updated: 03/24/2008 Current Problems:  MALIGNANT MELANOMA, SKIN (ICD-172.9) no recurrence on leg  HYPERTENSION (ICD-401.9) COPD (ICD-496)   -FeV1 51% 4/09 Allergic Rhinitis  Social History: Last updated: 09/17/2007 Patient states former smoker.   Review of Systems       The patient complains of dyspnea on exertion.  The patient denies anorexia, fever, weight loss, weight gain, vision loss, decreased hearing, hoarseness, chest pain, syncope, peripheral edema, prolonged cough, headaches, hemoptysis, abdominal pain, melena, hematochezia, severe indigestion/heartburn, hematuria, muscle weakness, suspicious skin lesions, difficulty walking, depression, unusual weight change, abnormal bleeding, enlarged lymph nodes, and angioedema.  Vital Signs:  Patient profile:   77 year old female Height:      62 inches Weight:      118.2 pounds BMI:     21.70 O2 Sat:      93 % on Room air Temp:     98.4 degrees F oral Pulse rate:   109 / minute BP sitting:   90 / 70  (left arm) Cuff size:   regular  Vitals Entered By: Zackery Barefoot CMA (July 02, 2010 4:20 PM)  O2 Flow:  Room air CC: Dr. Lynelle Doctor patient. Increased SOB starting Saturday, wheezing  starting yesterday, chest tightness, hot flashes and chills, nausea. Pt wants to discuss side effects of Nasonex  Comments Medications reviewed with patient Verified contact number and pharmacy with patient Zackery Barefoot CMA  July 02, 2010 4:21 PM    Physical Exam  Additional Exam:  Gen: Pleasant, well nourished, in no distress ENT: no lesions, no postnasal drip tender R lateral nares Neck: No JVD, no TMG, no carotid bruits Lungs: no use of accessory muscles, no dullness to percussion, faint exp rhonchi Cardiovascular: RRR, heart sounds normal, no murmurs or gallops, no peripheral edema Musculoskeletal: No deformities, no cyanosis or clubbing     Impression & Recommendations:  Problem # 1:  C O P D WITH ACUTE EXACERBATION (ICD-491.21) azithro x 5 ds Pred taper x 2 weeks, start at 40 mg CXR no infx Orders: Est. Patient Level IV (16109) Prescription Created Electronically (U0454) TLB-BMP (Basic Metabolic Panel-BMET) (80048-METABOL) T-2 View CXR (71020TC)  Problem # 2:  HYPERTENSION (ICD-401.9) Low Bp today, symptomatic Hold losartan & lasix x 3 days ,chk daily & restart if BP > 140 Chk BMET Her updated medication list for this problem includes:    Lasix 20 Mg Tabs (Furosemide) .Marland Kitchen... Take 1 tablet by mouth once a day    Losartan Potassium-hctz 100-25 Mg Tabs (Losartan potassium-hctz) ..... One by mouth daily  Medications Added to Medication List This Visit: 1)  Lasix 20 Mg Tabs (Furosemide) .... Take 1 tablet by mouth once a day 2)  Azithromycin 500 Mg Tabs (Azithromycin) .... Once daily 3)  Prednisone 10 Mg Tabs (Prednisone) .... Take 4 tabs  daily with food x 4 days, then 3 tabs daily x 4 days, then 2 tabs daily x 4 days, then 1 tab daily x4 days then stop. #40  Patient Instructions: 1)  Copy sent to: Dr Delford Field 2)  Please schedule a follow-up appointment in 2 weeks with TP 3)  A chest x-ray has been recommended.  Your imaging study may require preauthorization.  4)   Prednisone & ABx Rx will be sent 5)  Check potassium level 6)  DO NOT TAKE -lasix & BP pill x 3 days, chk BP daily , can start taking once BP 140 or higher Prescriptions: PREDNISONE 10 MG TABS (PREDNISONE) Take 4 tabs  daily with food x 4 days, then 3 tabs daily x 4 days, then 2 tabs daily x 4 days, then 1 tab daily x4 days then stop. #40  #40 x 0   Entered and Authorized by:   Comer Locket Vassie Loll MD   Signed by:   Comer Locket Vassie Loll MD on 07/02/2010   Method used:   [facility] to        [facility] [phone]* (retail)       [address] [place], [place]  [address]  Ph: [phone] or [phone]       Fax: [phone]   RxID:   [id number] AZITHROMYCIN 500 MG TABS (AZITHROMYCIN) once daily  #5 x 0   Entered and Authorized by:   Comer Locket Vassie Loll MD   Signed by:   Comer Locket Vassie Loll MD on 07/02/2010   Method used:   Electronically to        [facility] [address] (retail)       [address] [place], [place]  [address]       Ph: [phone] or [phone]       Fax: [phone]   RxID:   [id number]

## 2010-08-30 NOTE — Progress Notes (Signed)
Summary: increased SOB//jwr  Phone Note Call from Patient Call back at Home Phone [phone] Call back at [phone] cell   Caller: Patient Call For: [facility] Reason for Call: Talk to Nurse Summary of Call: Pt c/o increased SOB, chest tightness all the time, runny nose and eyes starting Saturday. Ok to leave a message on either phone rather it be on home phone. [facility] Initial call taken by: Zackery Barefoot CMA,  July 02, 2010 11:32 AM  Follow-up for Phone Call        Appt Scheduled Today Follow-up by: Vernie Murders,  July 02, 2010 11:37 AM

## 2010-08-30 NOTE — Assessment & Plan Note (Signed)
Summary: Pulmonary OV   Primary Provider/Referring Provider:  Ronald Lobo in [place]  CC:  Acute Visit.  c/o increased SOB with any activity, chest tightness, some wheezing, and prod cough with yellow mucus x 1wk.  Denies fever.  Marland Kitchen  History of Present Illness: Pulmonary OV  This is a 77 year old, white female with history of chronic obstructive lung disease with asthmatic bronchitis and emphysematous component.     October 12, 2009 12:21 PM since last ov .  doing ok,  fell over christmas and busted head open.  had a coughing virus.  had to take two rounds of abx  zpak then a fluoroquinolone.   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  no real wheeze,  no congestion now  has pain in facial in R maxillary bone    Dec 26, 2009 11:11 AM This pt is more dyspneic for one week.  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   Now notes more cough, and not able to move, in bed for 3 days Note pt is on an  ace inhibitor,  notes yellow and clear mucus.  No fever.   Was dx with trigeminal neuralgia.  October 12, 2009 12:21 PM since last ov .  doing ok,  fell over christmas and busted head open.  had a coughing virus.  had to take two rounds of abx  zpak then a fluoroquinolone.   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  no real wheeze,  no congestion now  has pain in facial in R maxillary bone    Preventive Screening-Counseling & Management  Alcohol-Tobacco     Smoking Status: quit > 6 months  Current Medications (verified): 1)  Nexium 40 Mg  Cpdr (Esomeprazole Magnesium) .... One By Mouth Once Daily 2)  Requip 2 Mg  Tabs (Ropinirole Hcl) .... One By Mouth At Bedtime 3)  Klor-Con 10 10 Meq  Tbcr (Potassium Chloride) .... 2 Tabs Once Daily 4)  Spiriva Handihaler 18 Mcg  Caps  (Tiotropium Bromide Monohydrate) .... One Capsule in Handihaler Once Daily 5)  Qvar 80 Mcg/act  Aers (Beclomethasone Dipropionate) .... Three Puff Two Times A Day 6)  Nasonex 50 Mcg/act  Susp (Mometasone Furoate) .... Two Puff Ea Nostril Once Daily 7)  Meloxicam 7.5 Mg  Tabs (Meloxicam) .... Daily As Needed 8)  Foltx 2.5-25-2 Mg  Tabs (Fa-Pyridoxine-Cyancobalamin) .... Once Daily 9)  Vivelle-Dot 0.1 Mg/24hr  Pttw (Estradiol) .... Apply Twice A Week 10)  Lipitor 10 Mg Tabs (Atorvastatin Calcium) .... Take 1 Tablet By Mouth Once A Day 11)  Hydrochlorothiazide 25 Mg  Tabs (Hydrochlorothiazide) .... Once Daily 12)  Meclizine Hcl 25 Mg Tabs (Meclizine Hcl) .... As Needed 13)  Quinapril Hcl 10 Mg Tabs (Quinapril Hcl) .... Take 1 Tablet By Mouth Once A Day 14)  Oxycodone-Acetaminophen 5-325 Mg Tabs (Oxycodone-Acetaminophen) .... As Needed 15)  Lidoderm 5 % Ptch (Lidocaine) .... Use As Needed  Allergies (verified): 1)  ! Penicillin 2)  ! Doxycycline  Past History:  Past medical, surgical, family and social histories (including risk factors) reviewed, and no changes noted (except as noted below).  Past Medical History: Reviewed history from 03/24/2008 and no changes required. Current Problems:  MALIGNANT MELANOMA, SKIN (ICD-172.9) no recurrence on leg  HYPERTENSION (ICD-401.9) COPD (ICD-496)   -FeV1 51% 4/09 Allergic Rhinitis  Past Surgical History: Reviewed history from 09/17/2007 and no changes required. Appendectomy Breast Surgery: bilateral mastectomy 1980s Cholecystectomy Total Abdominal Hysterectomy  Family History: Reviewed history from 09/17/2007 and no changes required. Diabetes Alzheimers disease  Social History: Reviewed history from 09/17/2007 and no changes required. Patient states former smoker.   Review of Systems       The patient complains of shortness of breath with activity, shortness of breath at rest, productive cough, non-productive cough, and loss of  appetite.  The patient denies coughing up blood, chest pain, irregular heartbeats, acid heartburn, indigestion, weight change, abdominal pain, difficulty swallowing, sore throat, tooth/dental problems, headaches, nasal congestion/difficulty breathing through nose, sneezing, itching, ear ache, anxiety, depression, hand/feet swelling, joint stiffness or pain, rash, change in color of mucus, and fever.         Notes nausea and is not eating  Vital Signs:  Patient profile:   77 year old female Height:      62 inches Weight:      116 pounds BMI:     21.29 O2 Sat:      97 % on Room air Temp:     98.1 degrees F oral Pulse rate:   85 / minute BP sitting:   152 / 80  (right arm) Cuff size:   regular  Vitals Entered By: Gweneth Dimitri RN (Dec 26, 2009 10:58 AM)  O2 Flow:  Room air CC: Acute Visit.  c/o increased SOB with any activity, chest tightness, some wheezing, prod cough with yellow mucus x 1wk.  Denies fever.   Comments Medications reviewed with patient Daytime contact number verified with patient. Gweneth Dimitri RN  Dec 26, 2009 10:58 AM    Physical Exam  Additional Exam:  Gen: Pleasant, well nourished, in no distress ENT: no lesions, no postnasal drip tender R lateral nares Neck: No JVD, no TMG, no carotid bruits Lungs: no use of accessory muscles, no dullness to percussion, prolonged exp phase , insp /exp wheezes Cardiovascular: RRR, heart sounds normal, no murmurs or gallops, no peripheral edema Musculoskeletal: No deformities, no cyanosis or clubbing     Impression & Recommendations:  Problem # 1:  ACUTE BRONCHITIS (ICD-466.0) Assessment Deteriorated acute tracheobronchitis with flare. Copd exacerbation  plan avelox x 5 days d/c ace inhibitor start ARB  pulse prednisone stop qvar start symbicort cont spiriva note CXR today 5/31: neg for acute process Her updated medication list for this problem includes:    Spiriva Handihaler 18 Mcg Caps (Tiotropium bromide  monohydrate) ..... One capsule in handihaler once daily    Symbicort 160-4.5 Mcg/act Aero (Budesonide-formoterol fumarate) .Marland Kitchen..Marland Kitchen Two puffs twice daily    Avelox 400 Mg Tabs (Moxifloxacin hcl) ..... By mouth daily  Orders: Est. Patient Level V (16109) Prescription Created Electronically [phone]) T-2 View CXR (71020TC)  Medications Added to Medication List This Visit: 1)  Klor-con 10 10 Meq Tbcr (Potassium chloride) .... 2 tabs once daily 2)  Symbicort 160-4.5 Mcg/act Aero (Budesonide-formoterol fumarate) .... Two puffs twice daily 3)  Losartan Potassium 50 Mg Tabs (Losartan potassium) .... One by mouth daily 4)  Prednisone 10 Mg Tabs (Prednisone) .... Take as directed 4 each am x 4 days, 3 x 4 days, 2 x 4 days, 1 x 4 days then stop 5)  Avelox 400 Mg Tabs (Moxifloxacin hcl) .... By mouth daily  Complete Medication List: 1)  Nexium 40  Mg Cpdr (Esomeprazole magnesium) .... One by mouth once daily 2)  Requip 2 Mg Tabs (Ropinirole hcl) .... One by mouth at bedtime 3)  Klor-con 10 10 Meq Tbcr (Potassium chloride) .... 2 tabs once daily 4)  Spiriva Handihaler 18 Mcg Caps (Tiotropium bromide monohydrate) .... One capsule in handihaler once daily 5)  Symbicort 160-4.5 Mcg/act Aero (Budesonide-formoterol fumarate) .... Two puffs twice daily 6)  Nasonex 50 Mcg/act Susp (Mometasone furoate) .... Two puff ea nostril once daily 7)  Meloxicam 7.5 Mg Tabs (Meloxicam) .... Daily as needed 8)  Foltx 2.5-25-2 Mg Tabs (Fa-pyridoxine-cyancobalamin) .... Once daily 9)  Vivelle-dot 0.1 Mg/24hr Pttw (Estradiol) .... Apply twice a week 10)  Lipitor 10 Mg Tabs (Atorvastatin calcium) .... Take 1 tablet by mouth once a day 11)  Hydrochlorothiazide 25 Mg Tabs (Hydrochlorothiazide) .... Once daily 12)  Meclizine Hcl 25 Mg Tabs (Meclizine hcl) .... As needed 13)  Losartan Potassium 50 Mg Tabs (Losartan potassium) .... One by mouth daily 14)  Oxycodone-acetaminophen 5-325 Mg Tabs (Oxycodone-acetaminophen) .... As  needed 15)  Lidoderm 5 % Ptch (Lidocaine) .... Use as needed 16)  Prednisone 10 Mg Tabs (Prednisone) .... Take as directed 4 each am x 4 days, 3 x 4 days, 2 x 4 days, 1 x 4 days then stop 17)  Avelox 400 Mg Tabs (Moxifloxacin hcl) .... By mouth daily  Patient Instructions: 1)  Stop qunipril 2)  Start Losartan one daily 50mg  3)  Prednisone 10mg  4 each am x 4 days, 3 x 4 days, 2 x 4 days, 1 x 4 days then stop 4)  Avelox one daily for 5 days 5)  Stop Qvar 6)  Start Symbicort two puff twice daily 7)  Stay on Spiriva  8)  Return one week for recheck with Tammy Parrett NP 9)  Chest xray today on way out 10)  Return [place] one month with Dr Delford Field Prescriptions: AVELOX 400 MG  TABS (MOXIFLOXACIN HCL) By mouth daily Brand medically necessary #4 x 0   Entered and Authorized by:   Storm Frisk MD   Signed by:   Storm Frisk MD on 12/26/2009   Method used:   Electronically to        [facility] [phone]* (retail)       [address] [place], [place]  [address]       Ph: [phone] or [phone]       Fax: [phone]   RxID:   [id number] LOSARTAN POTASSIUM 50 MG TABS (LOSARTAN POTASSIUM) One by mouth daily  #30 x 6   Entered and Authorized by:   Storm Frisk MD   Signed by:   Storm Frisk MD on 12/26/2009   Method used:   Electronically to        [facility] [address]* (retail)       [address] [place], [place]  [address]       Ph: [phone] or [phone]       Fax: [phone]   RxID:   [id number] PREDNISONE 10 MG  TABS (PREDNISONE) Take as directed 4 each am x 4 days, 3 x 4 days, 2 x 4 days, 1 x 4 days then stop  #40 x 0   Entered and Authorized by:   Storm Frisk MD   Signed by:   Storm Frisk MD on 12/26/2009   Method used:  Electronically to        [facility] [phone]* (retail)       [address] [place], [place]  [address]       Ph: [phone] or [phone]       Fax: [phone]   RxID:   [id number] SYMBICORT  160-4.5 MCG/ACT  AERO (BUDESONIDE-FORMOTEROL FUMARATE) Two puffs twice daily  #1 x 6   Entered and Authorized by:   Storm Frisk MD   Signed by:   Storm Frisk MD on 12/26/2009   Method used:   Electronically to        [facility] [phone]* (retail)       [address] [place], [place]  [address]       Ph: [phone] or [phone]       Fax: [phone]   RxID:   [id number]   Appended Document: Pulmonary OV fax Ronald Lobo

## 2010-08-30 NOTE — Progress Notes (Signed)
Summary: med change  Phone Note Call from Patient   Caller: Patient Call For: wright Summary of Call: pt have questions about medication change Initial call taken by: Rickard Patience,  April 18, 2010 1:17 PM  Follow-up for Phone Call        LMTCB-need to know what medication pt is referring to.Reynaldo Minium CMA  April 18, 2010 3:30 PM   pt states she saw dr Delford Field in june and was given losarten potassium-hctz 100/25 1 by mouth once daily, pt was already on klor con 2 once daily, pt is now questioning if she should be on all of this, she has been taking both since june but wants to know if that is what you wanted--pls advise Follow-up by: Philipp Deputy CMA,  April 19, 2010 3:49 PM  Additional Follow-up for Phone Call Additional follow up Details #1::        the potassium in the losartan is not therapeuic   no changes Additional Follow-up by: Storm Frisk MD,  April 19, 2010 4:34 PM    Additional Follow-up for Phone Call Additional follow up Details #2::    Called, spoke with pt.  Informed of her above per PW.  She verbalized understanding and is aware no change -- cont Klor con and losartan potassium-hctz.  Gweneth Dimitri RN  April 19, 2010 4:46 PM  Follow-up by: Gweneth Dimitri RN,  April 19, 2010 4:46 PM

## 2010-08-30 NOTE — Progress Notes (Signed)
Summary: meds  Phone Note Call from Patient Call back at Home Phone [phone] Call back at cell [phone]   Caller: Patient Call For: wright Reason for Call: Talk to Nurse, Talk to Doctor, Lab or Test Results Summary of Call: has been using Brovana, you have recently made changes to pt's meds. So, pt wants to know if she is to continue on the [place]? Initial call taken by: Eugene Gavia,  January 15, 2010 9:48 AM  Follow-up for Phone Call        Spoke with pt.  She states that she took her self off of brovana "a long time ago" and started back on this med about 3 months ago. She states "I can't tell if it's really helping, but I think it might be"- she takes med two times a day.  Not currently having any problems with breathing, just wants to know of PW wants her to stay on this med.  Please advise, thanks! Follow-up by: Vernie Murders,  January 15, 2010 2:05 PM  Additional Follow-up for Phone Call Additional follow up Details #1::        she should not be on brovana and symbicort she only should be on symbicort stop the brovana Additional Follow-up by: Storm Frisk MD,  January 15, 2010 5:42 PM    Additional Follow-up for Phone Call Additional follow up Details #2::    pt aware and will stop brovana at this point and see pw next week Follow-up by: Philipp Deputy CMA,  January 16, 2010 8:33 AM

## 2010-09-06 ENCOUNTER — Ambulatory Visit (INDEPENDENT_AMBULATORY_CARE_PROVIDER_SITE_OTHER): Payer: Medicare Other | Admitting: Critical Care Medicine

## 2010-09-06 ENCOUNTER — Encounter: Payer: Self-pay | Admitting: Critical Care Medicine

## 2010-09-06 DIAGNOSIS — J449 Chronic obstructive pulmonary disease, unspecified: Secondary | ICD-10-CM

## 2010-09-19 NOTE — Assessment & Plan Note (Signed)
Summary: Pulmonary OV   Primary Provider/Referring Provider:  Ronald Lobo in [place]  CC:  followup; notie sob when lying down and a little cough no production.  History of Present Illness: 77 -year-old, white female with history of chronic obstructive lung disease with asthmatic bronchitis and emphysematous component.    Gold Stage III  September 06, 2010 12:04 PM Pt will get dyspneic at times.  no cough ,  no real wheeze no chest pain, no edema in the feet.   overall stable at this time Pt denies any significant sore throat, nasal congestion or excess secretions, fever, chills, sweats, unintended weight loss, pleurtic or exertional chest pain, orthopnea PND, or leg swelling Pt also denies any obvious fluctuation in symptoms wiht weather or environmental change or other alleviating or aggravating factors Pt denies any increase in rescue therapy over baseline, denies waking up needing it or having any early am or nocturnal exacerbations of coughing/wheezing/or dyspnea.    Current Medications (verified): 1)  Nexium 40 Mg  Cpdr (Esomeprazole Magnesium) .... One By Mouth Once Daily 2)  Requip 2 Mg  Tabs (Ropinirole Hcl) .... One By Mouth At Bedtime 3)  Klor-Con 10 10 Meq  Tbcr (Potassium Chloride) .... 2 Tabs Once Daily 4)  Spiriva Handihaler 18 Mcg  Caps (Tiotropium Bromide Monohydrate) .... One Capsule in Handihaler Once Daily 5)  Symbicort 160-4.5 Mcg/act  Aero (Budesonide-Formoterol Fumarate) .... Two Puffs Twice Daily 6)  Nasonex 50 Mcg/act  Susp (Mometasone Furoate) .... Two Puff Ea Nostril Once Daily 7)  Foltx 2.5-25-2 Mg  Tabs (Fa-Pyridoxine-Cyancobalamin) .... Once Daily 8)  Vivelle-Dot 0.1 Mg/24hr  Pttw (Estradiol) .... Apply Twice A Week 9)  Lipitor 10 Mg Tabs (Atorvastatin Calcium) .... Take 1 Tablet By Mouth Once A Day 10)  Lasix 20 Mg  Tabs (Furosemide) .... Take 1 Tablet By Mouth Once A Day 11)  Losartan Potassium-Hctz 100-25 Mg Tabs (Losartan Potassium-Hctz) .... One By  Mouth Daily 12)  Hyoscyamine Sulfate 0.125 Mg Tabs (Hyoscyamine Sulfate) .... Take 1 Tab By Mouth Once Daily As Needed For Abdominal Cramps 13)  Meclizine Hcl 25 Mg Tabs (Meclizine Hcl) .... As Needed 14)  Lidoderm 5 % Ptch (Lidocaine) .... Use As Needed 15)  Oxycodone-Acetaminophen 5-325 Mg Tabs (Oxycodone-Acetaminophen) .... As Needed 16)  Proair Hfa 108 (90 Base) Mcg/act Aers (Albuterol Sulfate) .... 2 Puffs Every 4-6 Hrs As Needed Wheezing  Allergies: 1)  ! Penicillin 2)  ! Doxycycline  Past History:  Past medical, surgical, family and social histories (including risk factors) reviewed, and no changes noted (except as noted below).  Past Medical History: Reviewed history from 03/24/2008 and no changes required. Current Problems:  MALIGNANT MELANOMA, SKIN (ICD-172.9) no recurrence on leg  HYPERTENSION (ICD-401.9) COPD (ICD-496)   -FeV1 51% 4/09 Allergic Rhinitis  Past Surgical History: Reviewed history from 09/17/2007 and no changes required. Appendectomy Breast Surgery: bilateral mastectomy 1980s Cholecystectomy Total Abdominal Hysterectomy  Family History: Reviewed history from 09/17/2007 and no changes required. Diabetes Alzheimers disease  Social History: Reviewed history from 09/17/2007 and no changes required. Patient states former smoker.   Review of Systems       The patient complains of shortness of breath with activity and non-productive cough.  The patient denies shortness of breath at rest, productive cough, coughing up blood, chest pain, irregular heartbeats, acid heartburn, indigestion, loss of appetite, weight change, abdominal pain, difficulty swallowing, sore throat, tooth/dental problems, headaches, nasal congestion/difficulty breathing through nose, sneezing, itching, ear ache, anxiety, depression, hand/feet swelling,  joint stiffness or pain, rash, change in color of mucus, and fever.    Vital Signs:  Patient profile:   77 year old female Height:       62 inches Weight:      123 pounds BMI:     22.58 O2 Sat:      90 % on Room air Temp:     97.8 degrees F oral Pulse rate:   78 / minute BP sitting:   130 / 70  (right arm) Cuff size:   regular  Vitals Entered By: Kandice Hams CMA (September 06, 2010 12:04 PM)  O2 Flow:  Room air  Physical Exam  Additional Exam:  Gen: Pleasant, well nourished, in no distress ENT: no lesions,pale nasal mucosa, post pharynx clear.  Neck: No JVD, no TMG, no carotid bruits Lungs: no use of accessory muscles, no dullness to percussion, diminshed BS in bases  Cardiovascular: RRR, heart sounds normal, no murmurs or gallops, no peripheral edema Musculoskeletal: No deformities, no cyanosis or clubbing     Impression & Recommendations:  Problem # 1:  COPD (ICD-496) Assessment Unchanged  Golds stage III Copd  plan No change in inhaled medications.   Maintain treatment program as currently prescribed.  Medications Added to Medication List This Visit: 1)  Oxycodone-acetaminophen 5-325 Mg Tabs (Oxycodone-acetaminophen) .... As needed  Complete Medication List: 1)  Nexium 40 Mg Cpdr (Esomeprazole magnesium) .... One by mouth once daily 2)  Requip 2 Mg Tabs (Ropinirole hcl) .... One by mouth at bedtime 3)  Klor-con 10 10 Meq Tbcr (Potassium chloride) .... 2 tabs once daily 4)  Spiriva Handihaler 18 Mcg Caps (Tiotropium bromide monohydrate) .... One capsule in handihaler once daily 5)  Symbicort 160-4.5 Mcg/act Aero (Budesonide-formoterol fumarate) .... Two puffs twice daily 6)  Nasonex 50 Mcg/act Susp (Mometasone furoate) .... Two puff ea nostril once daily 7)  Foltx 2.5-25-2 Mg Tabs (Fa-pyridoxine-cyancobalamin) .... Once daily 8)  Vivelle-dot 0.1 Mg/24hr Pttw (Estradiol) .... Apply twice a week 9)  Lipitor 10 Mg Tabs (Atorvastatin calcium) .... Take 1 tablet by mouth once a day 10)  Lasix 20 Mg Tabs (Furosemide) .... Take 1 tablet by mouth once a day 11)  Losartan Potassium-hctz 100-25 Mg Tabs  (Losartan potassium-hctz) .... One by mouth daily 12)  Hyoscyamine Sulfate 0.125 Mg Tabs (Hyoscyamine sulfate) .... Take 1 tab by mouth once daily as needed for abdominal cramps 13)  Meclizine Hcl 25 Mg Tabs (Meclizine hcl) .... As needed 14)  Lidoderm 5 % Ptch (Lidocaine) .... Use as needed 15)  Oxycodone-acetaminophen 5-325 Mg Tabs (Oxycodone-acetaminophen) .... As needed 16)  Proair Hfa 108 (90 Base) Mcg/act Aers (Albuterol sulfate) .... 2 puffs every 4-6 hrs as needed wheezing  Other Orders: Est. Patient Level III (16109)  Patient Instructions: 1)  No change in medications 2)  Return in       4   months Prescriptions: PROAIR HFA 108 (90 BASE) MCG/ACT AERS (ALBUTEROL SULFATE) 2 puffs every 4-6 hrs as needed wheezing  #3 x 4   Entered and Authorized by:   Storm Frisk MD   Signed by:   Storm Frisk MD on 09/06/2010   Method used:   Print then Give to Patient   RxID:   [id number] NASONEX 50 MCG/ACT  SUSP (MOMETASONE FUROATE) two puff ea nostril once daily  #3 x 4   Entered and Authorized by:   Storm Frisk MD   Signed by:   Storm Frisk MD on  09/06/2010   Method used:   Print then Give to Patient   RxID:   [id number] SYMBICORT 160-4.5 MCG/ACT  AERO (BUDESONIDE-FORMOTEROL FUMARATE) Two puffs twice daily  #3 x 4   Entered and Authorized by:   Storm Frisk MD   Signed by:   Storm Frisk MD on 09/06/2010   Method used:   Print then Give to Patient   RxID:   [id number] NEXIUM 40 MG  CPDR (ESOMEPRAZOLE MAGNESIUM) one by mouth once daily  #90 x 4   Entered and Authorized by:   Storm Frisk MD   Signed by:   Storm Frisk MD on 09/06/2010   Method used:   Print then Give to Patient   RxID:   [id number] SPIRIVA HANDIHALER 18 MCG  CAPS (TIOTROPIUM BROMIDE MONOHYDRATE) one capsule in handihaler once daily  #90 x 4   Entered and Authorized by:   Storm Frisk MD   Signed by:   Storm Frisk MD on 09/06/2010   Method used:   Print  then Give to Patient   RxID:   [id number]

## 2010-11-05 ENCOUNTER — Telehealth: Payer: Self-pay | Admitting: Critical Care Medicine

## 2010-11-05 MED ORDER — TIOTROPIUM BROMIDE MONOHYDRATE 18 MCG IN CAPS: ORAL_CAPSULE | RESPIRATORY_TRACT | Status: DC

## 2010-11-05 MED ORDER — BUDESONIDE-FORMOTEROL FUMARATE 160-4.5 MCG/ACT IN AERO: INHALATION_SPRAY | RESPIRATORY_TRACT | Status: DC

## 2010-11-05 NOTE — Telephone Encounter (Signed)
Spoke w/ pt and she states she will not received her spiriva nor her symbicort until 2 weeks. Pt has ran out of her meds and needed something called in locally until her meds here from [facility]. Advised pt rx was sent

## 2010-12-14 NOTE — Assessment & Plan Note (Signed)
[facility]                               PULMONARY OFFICE NOTE   NAME:Tina [name], Tina [name]                         MRN:          [id number]  DATE:05/08/2006                            DOB:          16-Oct-1933    Tina [name] returns today in followup.  She is a 77 year old white female with  advanced chronic obstructive lung disease still quite sure __________with  any activity, has a dry cough, is maintained on Advair 500/50 one spray  b.i.d., Spiriva daily, Prozac 20 mg daily, Glucosamine b.i.d., folic acid  daily, Toprol-XL 25 mg daily, potassium 10 mEq daily, Requip 2 mg daily,  Nexium 40 mg daily.   PHYSICAL EXAMINATION:  VITAL SIGNS:  Temp 97, blood pressure 130/70, pulse  74, saturation 97% on room air.  CHEST:  Diminished breath sounds with prolonged expiratory phase.  No wheeze  or rhonchi noted.  CARDIAC:  Regular rate and rhythm without S3, normal S1 and S2.  ABDOMEN:  Soft and nontender.  EXTREMITIES:  No edema or clubbing.  SKIN:  Clear.   IMPRESSION:  Chronic obstructive lung disease with primary emphysematous  component and failure to respond to Advair therapy.   PLAN:  To discontinue the Advair and to begin Brovana by nebulization  b.i.d., continue Spiriva daily, discontinue further prednisone, begin Qvar  80 mcg strength 3 sprays b.i.d., and return the patient in followup in 2  months.  The patient did receive a flu vaccine today.       Charlcie Cradle Delford Field, MD, FCCP      PEW/MedQ  DD:  05/08/2006  DT:  05/10/2006  Job #:  [id number]

## 2010-12-14 NOTE — Assessment & Plan Note (Signed)
[facility]                               PULMONARY OFFICE NOTE   NAME:[name], Tina RUNK                         MRN:          [id number]  DATE:06/03/2006                            DOB:          11-13-1933    Tina [name] returns today in followup and has severe chronic obstructive lung  disease with obstructive defect and chronic airflow trapping.  She is back  for followup. She did not tolerate the Brovana well because of cramping and  tachycardia.  She is maintained on the Brovana twice daily.  She is also on  the QVAR  3 sprays b.i.d. 80 mcg strength, Spiriva daily, Nexium 40 mg  daily.   EXAMINATION:  Temperature 97.  Blood pressure 130/80.  Pulse 64.  Saturation  98% room air.  CHEST:  Showed distant breath sounds with prolonged expiratory phase.  No  wheeze or rhonchi.  CARDIAC:  Showed a regular rate and rhythm without S3.  Normal S1, S2.  ABDOMEN:  Was soft, nontender.  EXTREMITIES:  Show no edema or clubbing.  SKIN:  Was clear.   IMPRESSION:  Is that of chronic obstructive lung disease with chronic  airflow obstruction.  Note is made of recent pulmonary function showing an  FEV1 now down to 58% of predicted, FVC of 100% predicted. Total lung  capacity at 108% predicted, diffusion capacity low at 60% predicted.  These  values are largely unchanged compared to March 2006 with the exception that  the FEV1 is slightly down.   PLAN:  1. To reduce Brovana to 1/2 ampule b.i.d. to see if she will tolerate the      medicine better, maintain QVAR  3 sprays b.i.d. and maintain Spiriva      daily.  2. She will return to this office for recheck in 6 weeks.     Charlcie Cradle Delford Field, MD, [facility]  Electronically Signed    PEW/MedQ  DD: 06/03/2006  DT: 06/04/2006  Job #: [id number]   cc:   Karin Golden, M.D. Chales Abrahams

## 2010-12-14 NOTE — Assessment & Plan Note (Signed)
[facility]                             PULMONARY OFFICE NOTE   NAME:Tina [name], Tina [name]                         MRN:          [id number]  DATE:08/08/2006                            DOB:          January 05, 1934    [name] is a 77 year old white female with primary emphysema and  asthmatic bronchitis.   She is doing markedly better on:  1. Qvar 3 sprays b.i.d. 80 mcg strength  2. 1/2 Brovana by nebulization b.i.d.  3. Spiriva daily.   She is no longer having cramping in the hands or difficulty with  tachycardia on the 1/2 Brovana dosing.   She maintains:  1. Nexium 40 mg daily.   She does complain of headaches and sinus pressure.   PHYSICAL EXAMINATION:  VITAL SIGNS: Temperature 97, blood pressure  120/70, pulse 63, saturation 98% on room air.  HEENT: Reveals nares to be quite dry with caked mucus seen in the sinus  areas. There is evidence for findings compatible with sinusitis.  CHEST: Distant breath sounds without evidence of wheeze, rales, or  rhonchi.  CARDIAC: Regular rate and rhythm without S3, normal S1, S2.  ABDOMEN: Soft, nontender.  EXTREMITIES: No edema or clubbing.  SKIN: Clear.  NEUROLOGIC: Intact.  HEENT: Showed no jugular venous distension, no lymphadenopathy, oral  pharynx clear.  NECK: Supple.   IMPRESSION:  Chronic obstructive lung disease with primary emphysematous  component; positive response to bronchodilator therapy.   PLAN:  Maintain inhaled medicines as currently dosed. She will receive  Avelox 400 mg for 5 days and Nasonex 2 sprays each nostril b.i.d. for  sinusitis. She will continue saline lavage. We will see this patient  back in return followup in 1 month.     Charlcie Cradle Delford Field, MD, [facility]  Electronically Signed    PEW/MedQ  DD: 08/09/2006  DT: 08/10/2006  Job #: [id number]

## 2010-12-14 NOTE — Assessment & Plan Note (Signed)
[facility]                             PULMONARY OFFICE NOTE   NAME:[name], Tina JARED                         MRN:          [id number]  DATE:09/30/2006                            DOB:          08/08/1933    Ms. Show is a 77 year old white female with a history of chronic  obstructive lung disease, primary emphysema, asthmatic bronchitis.  The  patient is noting increased chest tightness and dyspnea recently.  She  is now back to 1 Brovana twice daily and is not having any hand cramping  or tachycardia that she had before.  She maintains:  1. Qvar 3 sprays b.i.d.  2. Nasonex 2 sprays b.i.d. each nostril.  3. Nexium 40 mg daily.  4. Spiriva daily.  5. She had been using the Nexium after meals and is noting increased      reflux symptoms.   EXAM:  Temperature 97.8, blood pressure 130/74, pulse 60, saturation 94%  on room air.  CHEST:  Distant breath sounds with prolonged expiratory phase.  No  wheeze or rhonchi noted.  CARDIAC:  A regular rate and rhythm without S3.  Normal S1, S2.  ABDOMEN:  Soft and nontender.  EXTREMITIES:  No edema or clubbing.  SKIN:  Clear.   IMPRESSION:  Chronic obstructive lung disease with primary emphysematous  component.   PLAN:  This patient is to reduce Nasonex to 2 sprays daily, utilize  Nexium 1/2 hour before meals, then have a meal.  The patient is to  increase Brovana to 1 full ampule twice daily, and we will see the  patient back in return followup in 2 months.     Charlcie Cradle Delford Field, MD, [facility]  Electronically Signed    PEW/MedQ  DD: 09/30/2006  DT: 09/30/2006  Job #: [id number]   cc:   Dr. Constance Haw

## 2010-12-14 NOTE — Assessment & Plan Note (Signed)
[facility]                               PULMONARY OFFICE NOTE   NAME:Beutler, FARHA DANO                         MRN:          [id number]  DATE:04/09/2006                            DOB:          10-18-1933    Ms. Gabbard is a 77 year old white female with a history of chronic  obstructive lung disease, asthmatic bronchitis, primary emphysematous  component, still having quite a bit of difficulty with breathing, having  cough productive of thick, dark green mucus, feeling some tightness in the  chest.  Maintains Advair 500/50, 1 spray b.i.d..  Spiriva daily.  She is on  no systemic steroids.  No recent antibiotics.  Maintains Nexium 40 mg daily.   PHYSICAL EXAM:  VITAL SIGNS:  Temperature 98, blood pressure 106/68, pulse  70, saturation 97% on room air.  CHEST:  Shows diminished breath sounds with prolonged expiratory phase.  No  wheeze or rhonchi noted.  CARDIAC:  Exam shows a regular rate and rhythm without S3.  Normal S1 and  S2.  ABDOMEN:  Soft and nontender.  EXTREMITIES:  Showed no edema or clubbing.  SKIN:  Clear.  NEUROLOGIC:  Exam was intact.  HEENT:  Exam showed no jugular venous distension, lymphadenopathy.  Oropharynx clear.  NECK:  Supple.   IMPRESSION:  Chronic obstructive lung disease with acute asthmatic  bronchitic component.   PLAN:  The plan is for the patient to receive prednisone 40 mg a day  tapering down by 10 mg every three days until off.  She will also receive  Avalox 400 mg a day for a five day course.  Mucinex 2 b.i.d. times 40  tablets.  She is to return to this office in follow-up in one month.                                   Charlcie Cradle Delford Field, MD, FCCP   PEW/MedQ  DD:  04/09/2006  DT:  04/09/2006  Job #:  [id number]   cc:   Dr. Constance Haw

## 2010-12-14 NOTE — Assessment & Plan Note (Signed)
[facility]                             PULMONARY OFFICE NOTE   NAME:Fischbach, LARKYN GREENBERGER                         MRN:          [id number]  DATE:06/23/2006                            DOB:          1934/05/20    Ms. Tina [name] is a 77 year old white female with history of chronic  obstructive lung disease, asthmatic bronchitis, primary emphysematous  component. The patient notes still having continued dry cough, nasal  congestion. Just finished a course of Zithromax. Symptoms are getting  now worsened with increased cough, increased post nasal drainage, a  thick yellow mucus, sinus pressure as well. She is doing well on 1/2  Brovana b.i.d. by nebulization, Qvar 3 sprays b.i.d. 80 mcg strength.  Maintain Spiriva daily. Nexium 40 mg daily. Other maintenance medicines  are listed on the chart and correct as reviewed on exam.   PHYSICAL EXAMINATION:  VITAL SIGNS:  Temp 98, blood pressure 130/80,  pulse 67, saturation 97% on room air.  CHEST:  Showed diminished breath sounds with prolonged expiratory phase.  No wheeze or rhonchi are noted.  CARDIAC:  Showed regular rate and rhythm without S3, normal S1, S2.  ABDOMEN:  Soft, nontender.  EXTREMITIES:  Showed no edema or clubbing.  SKIN:  Clear.  NEUROLOGIC:  Intact.  HEAD AND NECK:  Showed no jugular venous extension. No lymphadenopathy.  Oropharynx clear. Neck supple.   IMPRESSION:  1. Chronic obstructive lung disease, asthmatic bronchitic component.  2. Acute sinusitis.   PLAN:  For this patient is for the patient to receive Avelox 400 mg a  day for five day course. The patient will also receive Tussionex 5 cc  b.i.d. p.r.n. cough and NeoMed sinus wash to be used twice daily for at  least a week. Nasonex 2 sprays each nostril daily x1, sample only. She  is to maintain inhaled medicines as dosed and will see the patient back  in followup in six weeks.     Charlcie Cradle Delford Field, MD, [facility]  Electronically  Signed    PEW/MedQ  DD: 06/23/2006  DT: 06/23/2006  Job #: [id number]

## 2010-12-14 NOTE — Assessment & Plan Note (Signed)
[facility]                               PULMONARY OFFICE NOTE   NAME:Tina [name], Tina [name]                         MRN:          [id number]  DATE:05/23/2006                            DOB:          Oct 05, 1933    HISTORY OF PRESENT ILLNESS:  The patient is a 77 year old, white female  patient of Dr. Lynelle Doctor who has a known history of emphysematous COPD who  was recently seen by Dr. Delford Field 2 weeks ago.  At that time, the patient had  been discontinued off of Advair and changed to QVAR and Brovana.  The  patient reports that she has not been able to tolerate the [facility] and has  felt that it is causing her to be quite shaky with hand tremors and  hoarseness.  She also complains that she has restless leg syndrome and this  seems to be worse on the Brovana.  The patient also complains she has been  extremely nervous and has been having increased shortness of breath at  times.  The patient denies any purulent sputum, chest pain, orthopnea, PND,  or leg swelling.   PAST MEDICAL HISTORY:  Reviewed.   CURRENT MEDICATIONS:  Reviewed.   PHYSICAL EXAM:  The patient is a very anxious female in no acute distress.  She is afebrile with stable vital signs. O2 saturation is 96% on room air.  HEENT:  Unremarkable.  NECK:  Supple without adenopathy.  LUNGS:  Sounds are slightly diminished at the bases, otherwise clear.  CARDIAC:  Regular rate.  ABDOMEN:  Soft and benign.  EXTREMITIES:  Warm without any calf tenderness, clubbing, or edema.  The  patient does have a slight hand tremor bilaterally.  However to note that  the patient recently did have albuterol.   IMPRESSION AND PLAN:  Emphysematous chronic obstructive pulmonary disease.  The patient appears to not be tolerating Brovana very well.  At this time,  we will stop Brovana.  She will continue on QVAR and Spiriva regimen.  And  follow back up with Dr. Delford Field as scheduled in 2 weeks.  The patient also  was given  Xanax 0.25 mg #20, 1/2 to 1 tablet to use daily at bedtime if  needed.  No refills were given.  The patient will return here as scheduled.     ______________________________  Rubye Oaks, NP    ______________________________  Charlcie Cradle. Delford Field, MD, FCCP   TP/MedQ  DD: 05/26/2006  DT: 05/26/2006  Job #: [id number]

## 2010-12-14 NOTE — Assessment & Plan Note (Signed)
[facility]                               PULMONARY OFFICE NOTE   NAME:Tina [name], Tina [name]                         MRN:          [id number]  DATE:02/19/2006                            DOB:          12/25/1933    Ms Terwilliger returns today in followup.  This is a 77 year old female with a  history of chronic obstructive lung disease, asthmatic bronchitis, and  primary emphysema.  She has had two weeks of increasing chest tightness,  shortness of breath, and cough.  She is maintained on Advair 100/50, 1 spray  b.i.d. and Spiriva daily.   ON EXAM:  Temperature 97.  Blood pressure 160/78.  Pulse 62.  Saturation 95%  on room air.  GENERAL:  This is a middle-aged female in no distress.  CHEST:  Showed distant breath sounds with prolonged expiratory phase.  No  wheeze or rhonchi noted.  CARDIAC EXAM:  Showed a regular rate and rhythm without S3.  Normal S1, S2.  ABDOMEN:  Soft, nontender.  EXTREMITIES:  Showed no edema or clubbing.  SKIN:  Clear.  NEUROLOGIC EXAM:  Intact.  Spirometry is obtained, shows an FEV1 of 0.99, FVC of 2.8, FEV1/FVC ratio of  57% of predicted.  These values are compatible with severe obstructive  defect and are worse compared to values obtained in June 2006.   IMPRESSION:  Chronic obstructive lung disease with asthmatic bronchitic  component with acute flare.   PLAN:  The patient is to receive Advair, increase to 500/50, 1 spray b.i.d.;  maintain Spiriva as is; and return the patient for visit in one month.                                   Charlcie Cradle Delford Field, MD, [facility]   PEW/MedQ  DD:  02/19/2006  DT:  02/19/2006  Job #:  [id number]   cc:   Constance Haw, MD

## 2011-02-01 ENCOUNTER — Encounter: Payer: Self-pay | Admitting: Critical Care Medicine

## 2011-02-04 ENCOUNTER — Encounter: Payer: Self-pay | Admitting: Critical Care Medicine

## 2011-02-04 ENCOUNTER — Ambulatory Visit (INDEPENDENT_AMBULATORY_CARE_PROVIDER_SITE_OTHER): Payer: Medicare Other | Admitting: Critical Care Medicine

## 2011-02-04 DIAGNOSIS — J449 Chronic obstructive pulmonary disease, unspecified: Secondary | ICD-10-CM

## 2011-02-04 DIAGNOSIS — R609 Edema, unspecified: Secondary | ICD-10-CM

## 2011-02-04 DIAGNOSIS — R600 Localized edema: Secondary | ICD-10-CM

## 2011-02-04 NOTE — Assessment & Plan Note (Signed)
Stable Copd III Golds Plan No change in inhaled or maintenance medications. Return in 4 months

## 2011-02-04 NOTE — Patient Instructions (Signed)
I would suggest Azzie Roup of [facility] for Rheumatology Evaluations No change in medications. Return in        4 months,  Remember to get a flu vaccine this fall

## 2011-02-04 NOTE — Progress Notes (Signed)
Subjective:    Patient ID: Tina [name], female    DOB: October 13, 1933, 77 y.o.   MRN: [id number]  HPI History of Present Illness:  PCP [facility]  77 y.o.   white female with history of chronic obstructive lung disease with asthmatic bronchitis and emphysematous component. Gold Stage III     02/04/2011 No cough or mucus.  Dyspnea ok except in the heat.  Notes edema in feet.  Had a rash.  ?autoimmune issue. Rash went away. No changes pulm wise since 2/12   Past Medical History  Diagnosis Date  . Malignant melanoma     no recurrence on leg  . HTN (hypertension)   . COPD (chronic obstructive pulmonary disease)     FeV1 51% 4/09  . Allergic rhinitis      Family History  Problem Relation Age of Onset  . Diabetes    . Alzheimer's disease       History   Social History  . Marital Status: Married    Spouse Name: N/A    Number of Children: N/A  . Years of Education: N/A   Occupational History  . Not on file.   Social History Main Topics  . Smoking status: Former Smoker    Types: Cigarettes    Quit date: 07/29/1982  . Smokeless tobacco: Never Used  . Alcohol Use: Not on file  . Drug Use: Not on file  . Sexually Active: Not on file   Other Topics Concern  . Not on file   Social History Narrative   patient signed designated party release granting access to [facility] to husband [name]  detailled message may be left on home phone [name]  May 10, 2010 12:06 PM     Allergies  Allergen Reactions  . Doxycycline     REACTION: insomnia  . Penicillins     REACTION: shock     Outpatient Prescriptions Prior to Visit  Medication Sig Dispense Refill  . albuterol (PROAIR HFA) 108 (90 BASE) MCG/ACT inhaler Inhale 2 puffs into the lungs every 6 (six) hours as needed.        Marland Kitchen atorvastatin (LIPITOR) 10 MG tablet Take 10 mg by mouth daily.        . budesonide-formoterol (SYMBICORT) 160-4.5 MCG/ACT inhaler 2 puffs twice a day  1 Inhaler  0  . esomeprazole  (NEXIUM) 40 MG capsule Take 40 mg by mouth daily before breakfast.        . estradiol (VIVELLE-DOT) 0.1 MG/24HR Place 1 patch onto the skin 2 (two) times a week.        . folic acid-pyridoxine-cyancobalamin (FOLTX) 2.5-25-2 MG TABS Take 1 tablet by mouth daily.        . furosemide (LASIX) 20 MG tablet Take 20 mg by mouth daily.        Marland Kitchen lidocaine (LIDODERM) 5 % Place 1 patch onto the skin daily. Remove & Discard patch within 12 hours or as directed by MD       . losartan-hydrochlorothiazide (HYZAAR) 100-25 MG per tablet Take 1 tablet by mouth daily.        . meclizine (ANTIVERT) 25 MG tablet Take 25 mg by mouth 3 (three) times daily as needed.        . mometasone (NASONEX) 50 MCG/ACT nasal spray Place 2 sprays into the nose daily.        Marland Kitchen oxyCODONE-acetaminophen (PERCOCET) 5-325 MG per tablet Take 1 tablet by mouth every 4 (four) hours as needed.        Marland Kitchen  potassium chloride (KLOR-CON) 10 MEQ CR tablet Take 20 mEq by mouth daily.        Marland Kitchen rOPINIRole (REQUIP) 2 MG tablet Take 2 mg by mouth at bedtime.        Marland Kitchen tiotropium (SPIRIVA) 18 MCG inhalation capsule Place 18 mcg into inhaler and inhale daily.        . hyoscyamine (ANASPAZ) 0.125 MG TBDP Place 0.125 mg under the tongue daily as needed. For abdominal cramps           Review of Systems Constitutional:   No  weight loss, night sweats,  Fevers, chills, fatigue, lassitude. HEENT:   No headaches,  Difficulty swallowing,  Tooth/dental problems,  Sore throat,                No sneezing, itching, ear ache, nasal congestion, post nasal drip,   CV:  No chest pain,  Orthopnea, PND, swelling in lower extremities, anasarca, dizziness, palpitations  GI  No heartburn, indigestion, abdominal pain, nausea, vomiting, diarrhea, change in bowel habits, loss of appetite  Resp: Notes  shortness of breath with exertion not  at rest.  No excess mucus, no productive cough,  No non-productive cough,  No coughing up of blood.  No change in color of mucus.  No  wheezing.  No chest wall deformity  Skin: no rash or lesions.  GU: no dysuria, change in color of urine, no urgency or frequency.  No flank pain.  MS:  No joint pain or swelling.  No decreased range of motion.  No back pain.  Psych:  No change in mood or affect. No depression or anxiety.  No memory loss.     Objective:   Physical Exam Filed Vitals:   02/04/11 0950  BP: 156/76  Pulse: 61  Temp: 98 F (36.7 C)  TempSrc: Oral  Height: 5' 2.5" (1.588 m)  Weight: 125 lb 9.6 oz (56.972 kg)  SpO2: 99%    Gen: Pleasant, well-nourished, in no distress,  normal affect  ENT: No lesions,  mouth clear,  oropharynx clear, no postnasal drip  Neck: No JVD, no TMG, no carotid bruits  Lungs: No use of accessory muscles, no dullness to percussion,distant BS  Cardiovascular: RRR, heart sounds normal, no murmur or gallops, no peripheral edema  Abdomen: soft and NT, no HSM,  BS normal  Musculoskeletal: No deformities, no cyanosis or clubbing  Neuro: alert, non focal  Skin: Warm, no lesions or rashes      PFT Conversion 05/10/2010  FVC 2.13  FVC PREDICT 2.50  FVC  % Predicted 85  FEV1 1.04  FEV1 PREDICT 1.74  FEV % Predicted 59  FEV1/FVC 48.8  FEV1/FVC PRE 71  FEV1/FVC%EXP 69  FeF 25-75 0.42  FeF 25-75 % Predicted 2.05  FEF % EXPEC 20    Assessment & Plan:   COPD Stable Copd III Golds Plan No change in inhaled or maintenance medications. Return in 4 months    Updated Medication List Outpatient Encounter Prescriptions as of 02/04/2011  Medication Sig Dispense Refill  . albuterol (PROAIR HFA) 108 (90 BASE) MCG/ACT inhaler Inhale 2 puffs into the lungs every 6 (six) hours as needed.        Marland Kitchen atorvastatin (LIPITOR) 10 MG tablet Take 10 mg by mouth daily.        . budesonide-formoterol (SYMBICORT) 160-4.5 MCG/ACT inhaler 2 puffs twice a day  1 Inhaler  0  . esomeprazole (NEXIUM) 40 MG capsule Take 40 mg by mouth daily before breakfast.        .  estradiol (VIVELLE-DOT)  0.1 MG/24HR Place 1 patch onto the skin 2 (two) times a week.        . folic acid-pyridoxine-cyancobalamin (FOLTX) 2.5-25-2 MG TABS Take 1 tablet by mouth daily.        . furosemide (LASIX) 20 MG tablet Take 20 mg by mouth daily.        Marland Kitchen lidocaine (LIDODERM) 5 % Place 1 patch onto the skin daily. Remove & Discard patch within 12 hours or as directed by MD       . losartan-hydrochlorothiazide (HYZAAR) 100-25 MG per tablet Take 1 tablet by mouth daily.        . meclizine (ANTIVERT) 25 MG tablet Take 25 mg by mouth 3 (three) times daily as needed.        . mometasone (NASONEX) 50 MCG/ACT nasal spray Place 2 sprays into the nose daily.        Marland Kitchen oxyCODONE-acetaminophen (PERCOCET) 5-325 MG per tablet Take 1 tablet by mouth every 4 (four) hours as needed.        . potassium chloride (KLOR-CON) 10 MEQ CR tablet Take 20 mEq by mouth daily.        Marland Kitchen rOPINIRole (REQUIP) 2 MG tablet Take 2 mg by mouth at bedtime.        Marland Kitchen tiotropium (SPIRIVA) 18 MCG inhalation capsule Place 18 mcg into inhaler and inhale daily.        Marland Kitchen DISCONTD: hyoscyamine (ANASPAZ) 0.125 MG TBDP Place 0.125 mg under the tongue daily as needed. For abdominal cramps

## 2011-04-16 ENCOUNTER — Telehealth: Payer: Self-pay | Admitting: Critical Care Medicine

## 2011-04-16 NOTE — Telephone Encounter (Signed)
Pt returned call and can be reached [phone].  Tina [name]

## 2011-04-16 NOTE — Telephone Encounter (Signed)
lmomtcb to verify what medication was needed to be sent

## 2011-04-17 MED ORDER — ALBUTEROL SULFATE HFA 108 (90 BASE) MCG/ACT IN AERS: 2.0000 | INHALATION_SPRAY | Freq: Four times a day (QID) | RESPIRATORY_TRACT | Status: DC | PRN

## 2011-04-17 NOTE — Telephone Encounter (Signed)
Patient calling back.   °

## 2011-04-17 NOTE — Telephone Encounter (Signed)
Spoke with pt. I advised we will refill, but she is almost due for rov and needs appt sched. I have sched her for rov with PW in [place] 04/22/11 at 10:30 am

## 2011-04-22 ENCOUNTER — Encounter: Payer: Self-pay | Admitting: Critical Care Medicine

## 2011-04-22 ENCOUNTER — Ambulatory Visit (INDEPENDENT_AMBULATORY_CARE_PROVIDER_SITE_OTHER): Payer: Medicare Other | Admitting: Critical Care Medicine

## 2011-04-22 VITALS — BP 148/76 | HR 74 | Temp 98.2°F | Ht 62.5 in | Wt 121.0 lb

## 2011-04-22 DIAGNOSIS — R609 Edema, unspecified: Secondary | ICD-10-CM

## 2011-04-22 DIAGNOSIS — R600 Localized edema: Secondary | ICD-10-CM

## 2011-04-22 DIAGNOSIS — J449 Chronic obstructive pulmonary disease, unspecified: Secondary | ICD-10-CM

## 2011-04-22 DIAGNOSIS — Z23 Encounter for immunization: Secondary | ICD-10-CM

## 2011-04-22 MED ORDER — TIOTROPIUM BROMIDE MONOHYDRATE 18 MCG IN CAPS: 18.0000 ug | ORAL_CAPSULE | Freq: Every day | RESPIRATORY_TRACT | Status: DC

## 2011-04-22 MED ORDER — ALBUTEROL SULFATE HFA 108 (90 BASE) MCG/ACT IN AERS: 2.0000 | INHALATION_SPRAY | Freq: Four times a day (QID) | RESPIRATORY_TRACT | Status: DC | PRN

## 2011-04-22 MED ORDER — FUROSEMIDE 20 MG PO TABS: 20.0000 mg | ORAL_TABLET | Freq: Every day | ORAL | Status: DC | PRN

## 2011-04-22 MED ORDER — BUDESONIDE-FORMOTEROL FUMARATE 160-4.5 MCG/ACT IN AERO: INHALATION_SPRAY | RESPIRATORY_TRACT | Status: DC

## 2011-04-22 NOTE — Progress Notes (Signed)
Subjective:    Patient ID: Tina [name], female    DOB: July 22, 1934, 77 y.o.   MRN: [id number]  HPI    PCP [facility]  77 y.o.   white female with history of chronic obstructive lung disease with asthmatic bronchitis and emphysematous component. Gold Stage III     04/22/2011 Likes Rheum MD.  Now noting more dyspnea even at rest.  No more cough.  Feels more stopped up in the head the last few days.  No real chest pain.   No edema in feet .    Rheum w/u was neg.  Foot bone issues. Pt denies any significant sore throat, nasal congestion or excess secretions, fever, chills, sweats, unintended weight loss, pleurtic or exertional chest pain, orthopnea PND, or leg swelling Pt denies any increase in rescue therapy over baseline, denies waking up needing it or having any early am or nocturnal exacerbations of coughing/wheezing/or dyspnea.   Past Medical History  Diagnosis Date  . Malignant melanoma     no recurrence on leg  . HTN (hypertension)   . COPD (chronic obstructive pulmonary disease)     FeV1 51% 4/09  . Allergic rhinitis   . Degenerative arthritis of foot      Family History  Problem Relation Age of Onset  . Diabetes    . Alzheimer's disease       History   Social History  . Marital Status: Married    Spouse Name: N/A    Number of Children: N/A  . Years of Education: N/A   Occupational History  . Not on file.   Social History Main Topics  . Smoking status: Former Smoker    Types: Cigarettes    Quit date: 07/29/1982  . Smokeless tobacco: Never Used  . Alcohol Use: Not on file  . Drug Use: Not on file  . Sexually Active: Not on file   Other Topics Concern  . Not on file   Social History Narrative   patient signed designated party release granting access to [facility] to husband [name]  detailled message may be left on home phone [name]  May 10, 2010 12:06 PM     Allergies  Allergen Reactions  . Doxycycline     REACTION: insomnia  .  Penicillins     REACTION: shock     Outpatient Prescriptions Prior to Visit  Medication Sig Dispense Refill  . atorvastatin (LIPITOR) 10 MG tablet Take 10 mg by mouth daily.        Marland Kitchen esomeprazole (NEXIUM) 40 MG capsule Take 40 mg by mouth daily before breakfast.        . estradiol (VIVELLE-DOT) 0.1 MG/24HR Place 1 patch onto the skin 2 (two) times a week.        . folic acid-pyridoxine-cyancobalamin (FOLTX) 2.5-25-2 MG TABS Take 1 tablet by mouth daily.        Marland Kitchen lidocaine (LIDODERM) 5 % Place 1 patch onto the skin daily. Remove & Discard patch within 12 hours or as directed by MD       . losartan-hydrochlorothiazide (HYZAAR) 100-25 MG per tablet Take 1 tablet by mouth daily.        . mometasone (NASONEX) 50 MCG/ACT nasal spray Place 2 sprays into the nose daily.        . potassium chloride (KLOR-CON) 10 MEQ CR tablet Take 20 mEq by mouth daily.        Marland Kitchen rOPINIRole (REQUIP) 2 MG tablet Take 1 mg by mouth  at bedtime.       Marland Kitchen albuterol (PROAIR HFA) 108 (90 BASE) MCG/ACT inhaler Inhale 2 puffs into the lungs every 6 (six) hours as needed.  1 Inhaler  0  . budesonide-formoterol (SYMBICORT) 160-4.5 MCG/ACT inhaler 2 puffs twice a day  1 Inhaler  0  . furosemide (LASIX) 20 MG tablet Take 20 mg by mouth daily as needed.       . tiotropium (SPIRIVA) 18 MCG inhalation capsule Place 18 mcg into inhaler and inhale daily.        . meclizine (ANTIVERT) 25 MG tablet Take 25 mg by mouth 3 (three) times daily as needed.        Marland Kitchen oxyCODONE-acetaminophen (PERCOCET) 5-325 MG per tablet Take 1 tablet by mouth every 4 (four) hours as needed.            Review of Systems  Constitutional:   No  weight loss, night sweats,  Fevers, chills, fatigue, lassitude. HEENT:   No headaches,  Difficulty swallowing,  Tooth/dental problems,  Sore throat,                No sneezing, itching, ear ache, nasal congestion, post nasal drip,   CV:  No chest pain,  Orthopnea, PND, swelling in lower extremities, anasarca,  dizziness, palpitations  GI  No heartburn, indigestion, abdominal pain, nausea, vomiting, diarrhea, change in bowel habits, loss of appetite  Resp: Notes  shortness of breath with exertion not  at rest.  No excess mucus, no productive cough,  No non-productive cough,  No coughing up of blood.  No change in color of mucus.  No wheezing.  No chest wall deformity  Skin: no rash or lesions.  GU: no dysuria, change in color of urine, no urgency or frequency.  No flank pain.  MS:  No joint pain or swelling.  No decreased range of motion.  No back pain.  Psych:  No change in mood or affect. No depression or anxiety.  No memory loss.     Objective:   Physical Exam  Filed Vitals:   04/22/11 1042  BP: 148/76  Pulse: 74  Temp: 98.2 F (36.8 C)  TempSrc: Oral  Height: 5' 2.5" (1.588 m)  Weight: 121 lb (54.885 kg)  SpO2: 98%    Gen: Pleasant, well-nourished, in no distress,  normal affect  ENT: No lesions,  mouth clear,  oropharynx clear, no postnasal drip  Neck: No JVD, no TMG, no carotid bruits  Lungs: No use of accessory muscles, no dullness to percussion,distant BS  Cardiovascular: RRR, heart sounds normal, no murmur or gallops, no peripheral edema  Abdomen: soft and NT, no HSM,  BS normal  Musculoskeletal: No deformities, no cyanosis or clubbing  Neuro: alert, non focal  Skin: Warm, no lesions or rashes      PFT Conversion 05/10/2010  FVC 2.13  FVC PREDICT 2.50  FVC  % Predicted 85  FEV1 1.04  FEV1 PREDICT 1.74  FEV % Predicted 59  FEV1/FVC 48.8  FEV1/FVC PRE 71  FEV1/FVC%EXP 69  FeF 25-75 0.42  FeF 25-75 % Predicted 2.05  FEF % EXPEC 20    Assessment & Plan:   COPD Moderate Copd with poor HFA technique Plan I reviewed HFA technique with the patient and she improved from 40%>>>70% with retraining Cont HFA inhalers for now and spiriva Flu vaccine  Refills given      Updated Medication List Outpatient Encounter Prescriptions as of 04/22/2011    Medication Sig Dispense Refill  .  albuterol (PROAIR HFA) 108 (90 BASE) MCG/ACT inhaler Inhale 2 puffs into the lungs every 6 (six) hours as needed for shortness of breath.  3 Inhaler  4  . atorvastatin (LIPITOR) 10 MG tablet Take 10 mg by mouth daily.        . budesonide-formoterol (SYMBICORT) 160-4.5 MCG/ACT inhaler 2 puffs twice a day  3 Inhaler  4  . calcium carbonate (TUMS - DOSED IN MG ELEMENTAL CALCIUM) 500 MG chewable tablet Chew 2 tablets by mouth daily.        Marland Kitchen esomeprazole (NEXIUM) 40 MG capsule Take 40 mg by mouth daily before breakfast.        . estradiol (VIVELLE-DOT) 0.1 MG/24HR Place 1 patch onto the skin 2 (two) times a week.        . folic acid-pyridoxine-cyancobalamin (FOLTX) 2.5-25-2 MG TABS Take 1 tablet by mouth daily.        . furosemide (LASIX) 20 MG tablet Take 1 tablet (20 mg total) by mouth daily as needed.  90 tablet  4  . lidocaine (LIDODERM) 5 % Place 1 patch onto the skin daily. Remove & Discard patch within 12 hours or as directed by MD       . losartan-hydrochlorothiazide (HYZAAR) 100-25 MG per tablet Take 1 tablet by mouth daily.        . mometasone (NASONEX) 50 MCG/ACT nasal spray Place 2 sprays into the nose daily.        Marland Kitchen oxyCODONE-acetaminophen (PERCOCET) 7.5-325 MG per tablet Take 1 tablet by mouth 4 (four) times daily as needed.        . potassium chloride (KLOR-CON) 10 MEQ CR tablet Take 20 mEq by mouth daily.        Marland Kitchen rOPINIRole (REQUIP) 2 MG tablet Take 1 mg by mouth at bedtime.       Marland Kitchen tiotropium (SPIRIVA) 18 MCG inhalation capsule Place 1 capsule (18 mcg total) into inhaler and inhale daily.  90 capsule  4  . vitamin E 400 UNIT capsule Take 400 Units by mouth daily.        Marland Kitchen DISCONTD: albuterol (PROAIR HFA) 108 (90 BASE) MCG/ACT inhaler Inhale 2 puffs into the lungs every 6 (six) hours as needed.  1 Inhaler  0  . DISCONTD: budesonide-formoterol (SYMBICORT) 160-4.5 MCG/ACT inhaler 2 puffs twice a day  1 Inhaler  0  . DISCONTD: furosemide (LASIX) 20 MG  tablet Take 20 mg by mouth daily as needed.       Marland Kitchen DISCONTD: tiotropium (SPIRIVA) 18 MCG inhalation capsule Place 18 mcg into inhaler and inhale daily.        Marland Kitchen DISCONTD: meclizine (ANTIVERT) 25 MG tablet Take 25 mg by mouth 3 (three) times daily as needed.        Marland Kitchen DISCONTD: oxyCODONE-acetaminophen (PERCOCET) 5-325 MG per tablet Take 1 tablet by mouth every 4 (four) hours as needed.

## 2011-04-22 NOTE — Patient Instructions (Addendum)
Refills for 90 day supply prepared No change in medications. Return in              4 months Flu vaccine today Work on inhaler technique

## 2011-04-22 NOTE — Assessment & Plan Note (Addendum)
Moderate Copd with poor HFA technique Plan I reviewed HFA technique with the patient and she improved from 40%>>>70% with retraining Cont HFA inhalers for now and spiriva Flu vaccine  Refills given

## 2011-06-18 ENCOUNTER — Telehealth: Payer: Self-pay | Admitting: Critical Care Medicine

## 2011-06-18 DIAGNOSIS — J449 Chronic obstructive pulmonary disease, unspecified: Secondary | ICD-10-CM

## 2011-06-18 MED ORDER — ALBUTEROL SULFATE HFA 108 (90 BASE) MCG/ACT IN AERS: 2.0000 | INHALATION_SPRAY | Freq: Four times a day (QID) | RESPIRATORY_TRACT | Status: DC | PRN

## 2011-06-18 NOTE — Telephone Encounter (Signed)
Returning call can be reached at [phone].Tina [name]

## 2011-06-18 NOTE — Telephone Encounter (Signed)
Spoke with pt. She states that [facility] never received the fax we sent them for proair. I have reprinted rx and mailed to her per her request.

## 2011-06-18 NOTE — Telephone Encounter (Signed)
[id number] x 1 

## 2011-07-02 ENCOUNTER — Ambulatory Visit (INDEPENDENT_AMBULATORY_CARE_PROVIDER_SITE_OTHER): Payer: Medicare Other | Admitting: Adult Health

## 2011-07-02 ENCOUNTER — Encounter: Payer: Self-pay | Admitting: Adult Health

## 2011-07-02 ENCOUNTER — Telehealth: Payer: Self-pay | Admitting: Critical Care Medicine

## 2011-07-02 VITALS — BP 118/76 | HR 82 | Temp 98.1°F | Ht 62.5 in | Wt 121.0 lb

## 2011-07-02 DIAGNOSIS — J449 Chronic obstructive pulmonary disease, unspecified: Secondary | ICD-10-CM

## 2011-07-02 DIAGNOSIS — J4489 Other specified chronic obstructive pulmonary disease: Secondary | ICD-10-CM

## 2011-07-02 MED ORDER — AZITHROMYCIN 250 MG PO TABS: ORAL_TABLET | ORAL | Status: AC

## 2011-07-02 MED ORDER — ALBUTEROL SULFATE (2.5 MG/3ML) 0.083% IN NEBU
2.5000 mg | INHALATION_SOLUTION | RESPIRATORY_TRACT | Status: AC
  Administered 2011-07-02: 2.5 mg via RESPIRATORY_TRACT

## 2011-07-02 NOTE — Telephone Encounter (Signed)
PT RETURNED CALL . Tina [name]  °

## 2011-07-02 NOTE — Telephone Encounter (Signed)
I spoke with pt and she states she is spitting up green phlem and is losing her voice. Pt is going to see TP in [place] today at 2:30 to be evaluated

## 2011-07-02 NOTE — Progress Notes (Signed)
Subjective:    Patient ID: Tina [name], female    DOB: Dec 20, 1933, 77 y.o.   MRN: [id number]  HPI    PCP [facility]  77 y.o.   white female with history of chronic obstructive lung disease with asthmatic bronchitis and emphysematous component. Gold Stage III     04/22/11  Likes Rheum MD.  Now noting more dyspnea even at rest.  No more cough.  Feels more stopped up in the head the last few days.  No real chest pain.   No edema in feet .    Rheum w/u was neg.  Foot bone issues. >>no changes   07/02/2011 Acute OV  Complains of  productive cough with thick green mucus starting this AM, losing voice, sore throat . Very worried this willl turn into severe attack as in past. She denies any recent travel or abx use.  Cough and hoarseness are getitng bad today .  No hemopytsis or fever  No otc used.   Past Medical History  Diagnosis Date  . Malignant melanoma     no recurrence on leg  . HTN (hypertension)   . COPD (chronic obstructive pulmonary disease)     FeV1 51% 4/09  . Allergic rhinitis   . Degenerative arthritis of foot      Family History  Problem Relation Age of Onset  . Diabetes    . Alzheimer's disease       History   Social History  . Marital Status: Married    Spouse Name: N/A    Number of Children: N/A  . Years of Education: N/A   Occupational History  . Not on file.   Social History Main Topics  . Smoking status: Former Smoker -- 0.3 packs/day    Types: Cigarettes    Quit date: 07/29/1982  . Smokeless tobacco: Never Used  . Alcohol Use: Not on file  . Drug Use: Not on file  . Sexually Active: Not on file   Other Topics Concern  . Not on file   Social History Narrative   patient signed designated party release granting access to [facility] to husband [name]  detailled message may be left on home phone [name]  May 10, 2010 12:06 PM     Allergies  Allergen Reactions  . Doxycycline     REACTION: insomnia  . Penicillins    REACTION: shock     Outpatient Prescriptions Prior to Visit  Medication Sig Dispense Refill  . albuterol (PROAIR HFA) 108 (90 BASE) MCG/ACT inhaler Inhale 2 puffs into the lungs every 6 (six) hours as needed for shortness of breath.  3 Inhaler  3  . atorvastatin (LIPITOR) 10 MG tablet Take 10 mg by mouth daily.        . budesonide-formoterol (SYMBICORT) 160-4.5 MCG/ACT inhaler 2 puffs twice a day  3 Inhaler  4  . calcium carbonate (TUMS - DOSED IN MG ELEMENTAL CALCIUM) 500 MG chewable tablet Chew 2 tablets by mouth daily.        Marland Kitchen esomeprazole (NEXIUM) 40 MG capsule Take 40 mg by mouth daily before breakfast.        . estradiol (VIVELLE-DOT) 0.1 MG/24HR Place 1 patch onto the skin 2 (two) times a week.        . folic acid-pyridoxine-cyancobalamin (FOLTX) 2.5-25-2 MG TABS Take 1 tablet by mouth daily.        . furosemide (LASIX) 20 MG tablet Take 1 tablet (20 mg total) by mouth daily as  needed.  90 tablet  4  . lidocaine (LIDODERM) 5 % Place 1 patch onto the skin daily. Remove & Discard patch within 12 hours or as directed by MD       . losartan-hydrochlorothiazide (HYZAAR) 100-25 MG per tablet Take 1 tablet by mouth daily.        . mometasone (NASONEX) 50 MCG/ACT nasal spray Place 2 sprays into the nose daily.        Marland Kitchen oxyCODONE-acetaminophen (PERCOCET) 7.5-325 MG per tablet Take 1 tablet by mouth 4 (four) times daily as needed.        . potassium chloride (KLOR-CON) 10 MEQ CR tablet Take 20 mEq by mouth daily.        Marland Kitchen rOPINIRole (REQUIP) 2 MG tablet Take 1 mg by mouth at bedtime.       Marland Kitchen tiotropium (SPIRIVA) 18 MCG inhalation capsule Place 1 capsule (18 mcg total) into inhaler and inhale daily.  90 capsule  4  . vitamin E 400 UNIT capsule Take 400 Units by mouth daily.            Review of Systems  Constitutional:   No  weight loss, night sweats,  Fevers, chills,  ++fatigue, lassitude. HEENT:   No headaches,  Difficulty swallowing,  Tooth/dental problems,  Sore throat,                 No sneezing, itching, ear ache, + nasal congestion, post nasal drip, , hoarseness   CV:  No chest pain,  Orthopnea, PND, swelling in lower extremities, anasarca, dizziness, palpitations  GI  No heartburn, indigestion, abdominal pain, nausea, vomiting, diarrhea, change in bowel habits, loss of appetite  Resp:  ,  No coughing up of blood.   No chest wall deformity  Skin: no rash or lesions.  GU: no dysuria, change in color of urine, no urgency or frequency.  No flank pain.  MS:  No joint pain or swelling.  No decreased range of motion.     Psych:  No change in mood or affect. No depression or anxiety.  No memory loss.     Objective:   Physical Exam  Filed Vitals:   07/02/11 1407  BP: 118/76  Pulse: 82  Temp: 98.1 F (36.7 C)  TempSrc: Oral  Height: 5' 2.5" (1.588 m)  Weight: 121 lb (54.885 kg)  SpO2: 98%    Gen: Pleasant, thin elderly female , in no distress,  normal affect  ENT: No lesions,  mouth clear,  oropharynx clear, faint clear postnasal drip  Neck: No JVD, no TMG, no carotid bruits  Lungs: Coarse BS, no wheezing  no dullness to percussion,distant BS  Cardiovascular: RRR, heart sounds normal, no murmur or gallops, no peripheral edema  Abdomen: soft and NT, no HSM,  BS normal  Musculoskeletal: No deformities, no cyanosis or clubbing  Neuro: alert, non focal  Skin: Warm, no lesions or rashes      PFT Conversion 05/10/2010  FVC 2.13  FVC PREDICT 2.50  FVC  % Predicted 85  FEV1 1.04  FEV1 PREDICT 1.74  FEV % Predicted 59  FEV1/FVC 48.8  FEV1/FVC PRE 71  FEV1/FVC%EXP 69  FeF 25-75 0.42  FeF 25-75 % Predicted 2.05  FEF % EXPEC 20    Assessment & Plan:   COPD Very early flare  abx talk  xopenex neb in office    Plan  Astepro 2 puffs At bedtime  -until sample is gone.  Zpack as directed.  Mucinex DM Twice  daily  As needed  Cough/congestion.  Salt water gargles as needed.  Fluids and rest  Please contact office for sooner follow up if  symptoms do not improve or worsen or seek emergency care  follow up Dr. Delford Field  In 2 months and As needed            Updated Medication List Outpatient Encounter Prescriptions as of 07/02/2011  Medication Sig Dispense Refill  . albuterol (PROAIR HFA) 108 (90 BASE) MCG/ACT inhaler Inhale 2 puffs into the lungs every 6 (six) hours as needed for shortness of breath.  3 Inhaler  3  . atorvastatin (LIPITOR) 10 MG tablet Take 10 mg by mouth daily.        . budesonide-formoterol (SYMBICORT) 160-4.5 MCG/ACT inhaler 2 puffs twice a day  3 Inhaler  4  . calcium carbonate (TUMS - DOSED IN MG ELEMENTAL CALCIUM) 500 MG chewable tablet Chew 2 tablets by mouth daily.        Marland Kitchen esomeprazole (NEXIUM) 40 MG capsule Take 40 mg by mouth daily before breakfast.        . estradiol (VIVELLE-DOT) 0.1 MG/24HR Place 1 patch onto the skin 2 (two) times a week.        . folic acid-pyridoxine-cyancobalamin (FOLTX) 2.5-25-2 MG TABS Take 1 tablet by mouth daily.        . furosemide (LASIX) 20 MG tablet Take 1 tablet (20 mg total) by mouth daily as needed.  90 tablet  4  . lidocaine (LIDODERM) 5 % Place 1 patch onto the skin daily. Remove & Discard patch within 12 hours or as directed by MD       . losartan-hydrochlorothiazide (HYZAAR) 100-25 MG per tablet Take 1 tablet by mouth daily.        . mometasone (NASONEX) 50 MCG/ACT nasal spray Place 2 sprays into the nose daily.        Marland Kitchen oxyCODONE-acetaminophen (PERCOCET) 7.5-325 MG per tablet Take 1 tablet by mouth 4 (four) times daily as needed.        . potassium chloride (KLOR-CON) 10 MEQ CR tablet Take 20 mEq by mouth daily.        Marland Kitchen rOPINIRole (REQUIP) 2 MG tablet Take 1 mg by mouth at bedtime.       Marland Kitchen tiotropium (SPIRIVA) 18 MCG inhalation capsule Place 1 capsule (18 mcg total) into inhaler and inhale daily.  90 capsule  4  . vitamin E 400 UNIT capsule Take 400 Units by mouth daily.        Marland Kitchen azithromycin (ZITHROMAX Z-PAK) 250 MG tablet Take 2 tablets (500 mg) on  Day  1,  followed by 1 tablet (250 mg) once daily on Days 2 through 5.  6 each  0

## 2011-07-02 NOTE — Telephone Encounter (Signed)
[id number] x1 

## 2011-07-02 NOTE — Patient Instructions (Addendum)
Astepro 2 puffs At bedtime  -until sample is gone.  Zpack as directed.  Mucinex DM Twice daily  As needed  Cough/congestion.  Salt water gargles as needed.  Fluids and rest  Please contact office for sooner follow up if symptoms do not improve or worsen or seek emergency care  follow up Dr. Delford Field  In 2 months and As needed     Align is the probiotic that we were talking about . -this is over the counter-can ask pharmacist if cheaper alternative.  DanActive is the probiotic yogurt drink

## 2011-07-02 NOTE — Assessment & Plan Note (Signed)
Very early flare  abx talk  xopenex neb in office    Plan  Astepro 2 puffs At bedtime  -until sample is gone.  Zpack as directed.  Mucinex DM Twice daily  As needed  Cough/congestion.  Salt water gargles as needed.  Fluids and rest  Please contact office for sooner follow up if symptoms do not improve or worsen or seek emergency care  follow up Dr. Delford Field  In 2 months and As needed

## 2011-07-02 NOTE — Progress Notes (Signed)
Addended by: Julaine Hua on: 07/02/2011 03:21 PM   Modules accepted: Orders

## 2011-08-22 ENCOUNTER — Other Ambulatory Visit: Payer: Self-pay | Admitting: *Deleted

## 2011-08-22 DIAGNOSIS — J449 Chronic obstructive pulmonary disease, unspecified: Secondary | ICD-10-CM

## 2011-08-22 MED ORDER — BUDESONIDE-FORMOTEROL FUMARATE 160-4.5 MCG/ACT IN AERO: INHALATION_SPRAY | RESPIRATORY_TRACT | Status: DC

## 2011-08-23 ENCOUNTER — Other Ambulatory Visit: Payer: Self-pay | Admitting: *Deleted

## 2011-08-23 DIAGNOSIS — J449 Chronic obstructive pulmonary disease, unspecified: Secondary | ICD-10-CM

## 2011-09-04 ENCOUNTER — Telehealth: Payer: Self-pay | Admitting: Critical Care Medicine

## 2011-09-04 DIAGNOSIS — J449 Chronic obstructive pulmonary disease, unspecified: Secondary | ICD-10-CM

## 2011-09-04 MED ORDER — ALBUTEROL SULFATE HFA 108 (90 BASE) MCG/ACT IN AERS: 2.0000 | INHALATION_SPRAY | Freq: Four times a day (QID) | RESPIRATORY_TRACT | Status: DC | PRN

## 2011-09-04 MED ORDER — TIOTROPIUM BROMIDE MONOHYDRATE 18 MCG IN CAPS: 18.0000 ug | ORAL_CAPSULE | Freq: Every day | RESPIRATORY_TRACT | Status: DC

## 2011-09-04 NOTE — Telephone Encounter (Signed)
I spoke with pt and she stated she needed her proair, spiriva and symbicort sent to [facility]. I advised pt her symbicort was already sent on 08/22/11 to them. She voiced her understanding and the other 2 rx's have been sent. Nothing further needed

## 2011-12-03 ENCOUNTER — Encounter: Payer: Self-pay | Admitting: Adult Health

## 2011-12-03 ENCOUNTER — Telehealth: Payer: Self-pay | Admitting: Critical Care Medicine

## 2011-12-03 ENCOUNTER — Ambulatory Visit (INDEPENDENT_AMBULATORY_CARE_PROVIDER_SITE_OTHER): Payer: Medicare Other | Admitting: Adult Health

## 2011-12-03 VITALS — BP 104/66 | HR 94 | Temp 98.0°F | Ht 62.0 in | Wt 122.0 lb

## 2011-12-03 DIAGNOSIS — J449 Chronic obstructive pulmonary disease, unspecified: Secondary | ICD-10-CM

## 2011-12-03 MED ORDER — PREDNISONE 10 MG PO TABS: ORAL_TABLET | ORAL | Status: DC

## 2011-12-03 MED ORDER — AZITHROMYCIN 250 MG PO TABS: ORAL_TABLET | ORAL | Status: AC

## 2011-12-03 NOTE — Patient Instructions (Signed)
Zpack as directed.  Mucinex DM Twice daily  As needed  Cough/congestion.  Fluids and rest  Prednisone taper over next week.  Please contact office for sooner follow up if symptoms do not improve or worsen or seek emergency care  follow up Dr. Delford Field  In 2 months and As needed

## 2011-12-03 NOTE — Assessment & Plan Note (Signed)
Flare   Plan:  Zpack as directed.  Mucinex DM Twice daily  As needed  Cough/congestion.  Fluids and rest  Prednisone taper over next week.  Please contact office for sooner follow up if symptoms do not improve or worsen or seek emergency care  follow up Dr. Delford Field  In 2 months and As needed

## 2011-12-03 NOTE — Telephone Encounter (Signed)
Spoke with pt. She is c/o increased SOB x 2 days and also increased wheezing. OV with TP at 3 pm today.

## 2011-12-03 NOTE — Telephone Encounter (Signed)
LMTCB

## 2011-12-03 NOTE — Progress Notes (Signed)
Subjective:    Patient ID: Tina [name], female    DOB: 1934-04-28, 78 y.o.   MRN: [id number]  HPI    PCP [facility]  78 y.o.   white female with history of chronic obstructive lung disease with asthmatic bronchitis and emphysematous component. Gold Stage III     04/22/11  Likes Rheum MD.  Now noting more dyspnea even at rest.  No more cough.  Feels more stopped up in the head the last few days.  No real chest pain.   No edema in feet .    Rheum w/u was neg.  Foot bone issues. >>no changes   07/01/12  Acute OV  Complains of  productive cough with thick green mucus starting this AM, losing voice, sore throat . Very worried this willl turn into severe attack as in past. She denies any recent travel or abx use.  Cough and hoarseness are getitng bad today .  No hemopytsis or fever  No otc used.  >>zpack rx   12/03/2011 Acute OV  Complains of increased SOB, wheezing, tightness in chest, orange-colored mucus, sore throat  X 2 days.  Leaving for [facility] in few days .  No hemoptysis or chest pain. No edema.  OTC not working.  No change in meds-remains on spiriva and symbicort.    Past Medical History  Diagnosis Date  . Malignant melanoma     no recurrence on leg  . HTN (hypertension)   . COPD (chronic obstructive pulmonary disease)     FeV1 51% 4/09  . Allergic rhinitis   . Degenerative arthritis of foot      Family History  Problem Relation Age of Onset  . Diabetes    . Alzheimer's disease       History   Social History  . Marital Status: Married    Spouse Name: N/A    Number of Children: N/A  . Years of Education: N/A   Occupational History  . Not on file.   Social History Main Topics  . Smoking status: Former Smoker -- 0.3 packs/day    Types: Cigarettes    Quit date: 07/29/1982  . Smokeless tobacco: Never Used  . Alcohol Use: Not on file  . Drug Use: Not on file  . Sexually Active: Not on file   Other Topics Concern  . Not on file   Social History  Narrative   patient signed designated party release granting access to [facility] to husband [name]  detailled message may be left on home phone [name]  May 10, 2010 12:06 PM     Allergies  Allergen Reactions  . Doxycycline     REACTION: insomnia  . Penicillins     REACTION: shock     Outpatient Prescriptions Prior to Visit  Medication Sig Dispense Refill  . albuterol (PROAIR HFA) 108 (90 BASE) MCG/ACT inhaler Inhale 2 puffs into the lungs every 6 (six) hours as needed for shortness of breath.  3 Inhaler  3  . atorvastatin (LIPITOR) 10 MG tablet Take 10 mg by mouth daily.        . budesonide-formoterol (SYMBICORT) 160-4.5 MCG/ACT inhaler 2 puffs twice a day  3 Inhaler  4  . calcium carbonate (TUMS - DOSED IN MG ELEMENTAL CALCIUM) 500 MG chewable tablet Chew 2 tablets by mouth daily.        Marland Kitchen esomeprazole (NEXIUM) 40 MG capsule Take 40 mg by mouth daily before breakfast.        .  estradiol (VIVELLE-DOT) 0.1 MG/24HR Place 1 patch onto the skin 2 (two) times a week.        . folic acid-pyridoxine-cyancobalamin (FOLTX) 2.5-25-2 MG TABS Take 1 tablet by mouth daily.        . furosemide (LASIX) 20 MG tablet Take 1 tablet (20 mg total) by mouth daily as needed.  90 tablet  4  . lidocaine (LIDODERM) 5 % Place 1 patch onto the skin daily. Remove & Discard patch within 12 hours or as directed by MD       . losartan-hydrochlorothiazide (HYZAAR) 100-25 MG per tablet Take 1 tablet by mouth daily.        . mometasone (NASONEX) 50 MCG/ACT nasal spray Place 2 sprays into the nose daily.        . potassium chloride (KLOR-CON) 10 MEQ CR tablet Take 20 mEq by mouth daily.        Marland Kitchen rOPINIRole (REQUIP) 2 MG tablet Take 1 mg by mouth at bedtime.       Marland Kitchen tiotropium (SPIRIVA) 18 MCG inhalation capsule Place 1 capsule (18 mcg total) into inhaler and inhale daily.  90 capsule  4  . vitamin E 400 UNIT capsule Take 400 Units by mouth daily.        Marland Kitchen oxyCODONE-acetaminophen (PERCOCET) 7.5-325 MG per tablet  Take 1 tablet by mouth 4 (four) times daily as needed.        . tiotropium (SPIRIVA HANDIHALER) 18 MCG inhalation capsule One capsule in handihaler once a day  30 capsule  0      Review of Systems  Constitutional:   No  weight loss, night sweats,  Fevers, chills,  ++fatigue, lassitude. HEENT:   No headaches,  Difficulty swallowing,  Tooth/dental problems,  Sore throat,                No sneezing, itching, ear ache, + nasal congestion, post nasal drip, , hoarseness   CV:  No chest pain,  Orthopnea, PND, swelling in lower extremities, anasarca, dizziness, palpitations  GI  No heartburn, indigestion, abdominal pain, nausea, vomiting, diarrhea, change in bowel habits, loss of appetite  Resp:  ,  No coughing up of blood.   No chest wall deformity  Skin: no rash or lesions.  GU: no dysuria, change in color of urine, no urgency or frequency.  No flank pain.  MS:  No joint pain or swelling.  No decreased range of motion.     Psych:  No change in mood or affect. No depression or anxiety.  No memory loss.     Objective:   Physical Exam  Filed Vitals:   12/03/11 1516  BP: 104/66  Pulse: 94  Temp: 98 F (36.7 C)  TempSrc: Oral  Height: 5\' 2"  (1.575 m)  Weight: 122 lb (55.339 kg)  SpO2: 97%    Gen: Pleasant, thin elderly female , in no distress,  normal affect  ENT: No lesions,  mouth clear,  oropharynx clear, faint clear postnasal drip  Neck: No JVD, no TMG, no carotid bruits  Lungs: Coarse BS, w/ faint exp wheezing  no dullness to percussion,distant BS  Cardiovascular: RRR, heart sounds normal, no murmur or gallops, no peripheral edema  Abdomen: soft and NT, no HSM,  BS normal  Musculoskeletal: No deformities, no cyanosis or clubbing  Neuro: alert, non focal  Skin: Warm, no lesions or rashes      PFT Conversion 05/10/2010  FVC 2.13  FVC PREDICT 2.50  FVC  % Predicted  85  FEV1 1.04  FEV1 PREDICT 1.74  FEV % Predicted 59  FEV1/FVC 48.8  FEV1/FVC PRE 71    FEV1/FVC%EXP 69  FeF 25-75 0.42  FeF 25-75 % Predicted 2.05  FEF % EXPEC 20    Assessment & Plan:   No problem-specific assessment & plan notes found for this encounter.      Updated Medication List Outpatient Encounter Prescriptions as of 12/03/2011  Medication Sig Dispense Refill  . albuterol (PROAIR HFA) 108 (90 BASE) MCG/ACT inhaler Inhale 2 puffs into the lungs every 6 (six) hours as needed for shortness of breath.  3 Inhaler  3  . atorvastatin (LIPITOR) 10 MG tablet Take 10 mg by mouth daily.        . budesonide-formoterol (SYMBICORT) 160-4.5 MCG/ACT inhaler 2 puffs twice a day  3 Inhaler  4  . calcium carbonate (TUMS - DOSED IN MG ELEMENTAL CALCIUM) 500 MG chewable tablet Chew 2 tablets by mouth daily.        Marland Kitchen esomeprazole (NEXIUM) 40 MG capsule Take 40 mg by mouth daily before breakfast.        . estradiol (VIVELLE-DOT) 0.1 MG/24HR Place 1 patch onto the skin 2 (two) times a week.        . folic acid-pyridoxine-cyancobalamin (FOLTX) 2.5-25-2 MG TABS Take 1 tablet by mouth daily.        . furosemide (LASIX) 20 MG tablet Take 1 tablet (20 mg total) by mouth daily as needed.  90 tablet  4  . lidocaine (LIDODERM) 5 % Place 1 patch onto the skin daily. Remove & Discard patch within 12 hours or as directed by MD       . losartan-hydrochlorothiazide (HYZAAR) 100-25 MG per tablet Take 1 tablet by mouth daily.        . mometasone (NASONEX) 50 MCG/ACT nasal spray Place 2 sprays into the nose daily.        Marland Kitchen oxyCODONE-acetaminophen (PERCOCET) 10-325 MG per tablet Take 1 tablet by mouth every 4 (four) hours as needed.      . potassium chloride (KLOR-CON) 10 MEQ CR tablet Take 20 mEq by mouth daily.        Marland Kitchen rOPINIRole (REQUIP) 2 MG tablet Take 1 mg by mouth at bedtime.       Marland Kitchen tiotropium (SPIRIVA) 18 MCG inhalation capsule Place 1 capsule (18 mcg total) into inhaler and inhale daily.  90 capsule  4  . vitamin E 400 UNIT capsule Take 400 Units by mouth daily.        Marland Kitchen DISCONTD:  oxyCODONE-acetaminophen (PERCOCET) 7.5-325 MG per tablet Take 1 tablet by mouth 4 (four) times daily as needed.        Marland Kitchen DISCONTD: tiotropium (SPIRIVA HANDIHALER) 18 MCG inhalation capsule One capsule in handihaler once a day  30 capsule  0

## 2011-12-19 ENCOUNTER — Ambulatory Visit (INDEPENDENT_AMBULATORY_CARE_PROVIDER_SITE_OTHER): Payer: Medicare Other | Admitting: Critical Care Medicine

## 2011-12-19 ENCOUNTER — Encounter: Payer: Self-pay | Admitting: Critical Care Medicine

## 2011-12-19 VITALS — BP 140/78 | HR 86 | Temp 98.3°F | Ht 62.0 in | Wt 121.0 lb

## 2011-12-19 DIAGNOSIS — J449 Chronic obstructive pulmonary disease, unspecified: Secondary | ICD-10-CM

## 2011-12-19 NOTE — Patient Instructions (Signed)
No change in medications. Return in   3 months 

## 2011-12-19 NOTE — Progress Notes (Signed)
Subjective:    Patient ID: Tina [name], female    DOB: September 17, 1933, 78 y.o.   MRN: [id number]  HPI    PCP [facility]  78 y.o.   white female with history of chronic obstructive lung disease with asthmatic bronchitis and emphysematous component. Gold Stage III     12/03/11 Acute OV  Complains of increased SOB, wheezing, tightness in chest, orange-colored mucus, sore throat  X 2 days.  Leaving for [facility] in few days .  No hemoptysis or chest pain. No edema.  OTC not working.  No change in meds-remains on spiriva and symbicort.    12/19/2011 Pt seen 5/7 per TP and rx zpak and pred.  Pt went to beach, seemed better then came home and worse and more low back pain .  Now more wheezing and cough   Notes more dyspnea as well. Pt denies any significant sore throat, nasal congestion or excess secretions, fever, chills, sweats, unintended weight loss, pleurtic or exertional chest pain, orthopnea PND, or leg swelling Pt denies any increase in rescue therapy over baseline, denies waking up needing it or having any early am or nocturnal exacerbations of coughing/wheezing/or dyspnea. Pt also denies any obvious fluctuation in symptoms with  weather or environmental change or other alleviating or aggravating factors     Past Medical History  Diagnosis Date  . Malignant melanoma     no recurrence on leg  . HTN (hypertension)   . COPD (chronic obstructive pulmonary disease)     FeV1 51% 4/09  . Allergic rhinitis   . Degenerative arthritis of foot      Family History  Problem Relation Age of Onset  . Diabetes    . Alzheimer's disease       History   Social History  . Marital Status: Married    Spouse Name: N/A    Number of Children: N/A  . Years of Education: N/A   Occupational History  . Not on file.   Social History Main Topics  . Smoking status: Former Smoker -- 0.3 packs/day for 30 years    Types: Cigarettes    Quit date: 07/29/1982  . Smokeless tobacco: Never Used    . Alcohol Use: Not on file  . Drug Use: Not on file  . Sexually Active: Not on file   Other Topics Concern  . Not on file   Social History Narrative   patient signed designated party release granting access to [facility] to husband [name]  detailled message may be left on home phone [name]  May 10, 2010 12:06 PM     Allergies  Allergen Reactions  . Doxycycline     REACTION: insomnia  . Penicillins     REACTION: shock     Outpatient Prescriptions Prior to Visit  Medication Sig Dispense Refill  . albuterol (PROAIR HFA) 108 (90 BASE) MCG/ACT inhaler Inhale 2 puffs into the lungs every 6 (six) hours as needed for shortness of breath.  3 Inhaler  3  . atorvastatin (LIPITOR) 10 MG tablet Take 10 mg by mouth daily.        . budesonide-formoterol (SYMBICORT) 160-4.5 MCG/ACT inhaler 2 puffs twice a day  3 Inhaler  4  . calcium carbonate (TUMS - DOSED IN MG ELEMENTAL CALCIUM) 500 MG chewable tablet Chew 2 tablets by mouth daily.        Marland Kitchen esomeprazole (NEXIUM) 40 MG capsule Take 40 mg by mouth daily before breakfast.        .  estradiol (VIVELLE-DOT) 0.1 MG/24HR Place 1 patch onto the skin 2 (two) times a week.        . folic acid-pyridoxine-cyancobalamin (FOLTX) 2.5-25-2 MG TABS Take 1 tablet by mouth daily.        . furosemide (LASIX) 20 MG tablet Take 1 tablet (20 mg total) by mouth daily as needed.  90 tablet  4  . lidocaine (LIDODERM) 5 % Place 2 patches onto the skin daily. Remove & Discard patch within 12 hours or as directed by MD      . losartan-hydrochlorothiazide (HYZAAR) 100-25 MG per tablet Take 1 tablet by mouth daily.        . mometasone (NASONEX) 50 MCG/ACT nasal spray Place 2 sprays into the nose daily.        Marland Kitchen oxyCODONE-acetaminophen (PERCOCET) 10-325 MG per tablet Take 1 tablet by mouth every 4 (four) hours as needed.      . potassium chloride (KLOR-CON) 10 MEQ CR tablet Take 10 mEq by mouth daily.       Marland Kitchen tiotropium (SPIRIVA) 18 MCG inhalation capsule Place 1  capsule (18 mcg total) into inhaler and inhale daily.  90 capsule  4  . vitamin E 400 UNIT capsule Take 400 Units by mouth daily.        . predniSONE (DELTASONE) 10 MG tablet 4 tabs for 2 days, then 3 tabs for 2 days, 2 tabs for 2 days, then 1 tab for 2 days, then stop  20 tablet  0  . rOPINIRole (REQUIP) 2 MG tablet Take 1 mg by mouth at bedtime.           Review of Systems  Constitutional:   No  weight loss, night sweats,  Fevers, chills,  +fatigue, lassitude. HEENT:   No headaches,  Difficulty swallowing,  Tooth/dental problems,  Sore throat,                No sneezing, itching, ear ache, + nasal congestion, post nasal drip, , hoarseness   CV:  No chest pain,  Orthopnea, PND, swelling in lower extremities, anasarca, dizziness, palpitations  GI  No heartburn, indigestion, abdominal pain, nausea, vomiting, diarrhea, change in bowel habits, loss of appetite  Resp:  ,  No coughing up of blood.   No chest wall deformity  Skin: no rash or lesions.  GU: no dysuria, change in color of urine, no urgency or frequency.  No flank pain.  MS:  No joint pain or swelling.  No decreased range of motion.     Psych:  No change in mood or affect. No depression or anxiety.  No memory loss.     Objective:   Physical Exam  Filed Vitals:   12/19/11 1447  BP: 140/78  Pulse: 86  Temp: 98.3 F (36.8 C)  TempSrc: Oral  Height: 5\' 2"  (1.575 m)  Weight: 121 lb (54.885 kg)  SpO2: 97%    Gen: Pleasant, thin elderly female , in no distress,  normal affect  ENT: No lesions,  mouth clear,  oropharynx clear, faint clear postnasal drip  Neck: No JVD, no TMG, no carotid bruits  Lungs: distant BS  dullness to percussion,distant BS  Cardiovascular: RRR, heart sounds normal, no murmur or gallops, no peripheral edema  Abdomen: soft and NT, no HSM,  BS normal  Musculoskeletal: No deformities, no cyanosis or clubbing  Neuro: alert, non focal  Skin: Warm, no lesions or rashes      PFT  Conversion 05/10/2010  FVC 2.13  FVC  PREDICT 2.50  FVC  % Predicted 85  FEV1 1.04  FEV1 PREDICT 1.74  FEV % Predicted 59  FEV1/FVC 48.8  FEV1/FVC PRE 71  FEV1/FVC%EXP 69  FeF 25-75 0.42  FeF 25-75 % Predicted 2.05  FEF % EXPEC 20    Assessment & Plan:   COPD Gold C. COPD with recent exacerbation now improved Plan Maintain inhaled medications as prescribed Return 3 months        Updated Medication List Outpatient Encounter Prescriptions as of 12/19/2011  Medication Sig Dispense Refill  . albuterol (PROAIR HFA) 108 (90 BASE) MCG/ACT inhaler Inhale 2 puffs into the lungs every 6 (six) hours as needed for shortness of breath.  3 Inhaler  3  . atorvastatin (LIPITOR) 10 MG tablet Take 10 mg by mouth daily.        . budesonide-formoterol (SYMBICORT) 160-4.5 MCG/ACT inhaler 2 puffs twice a day  3 Inhaler  4  . calcium carbonate (TUMS - DOSED IN MG ELEMENTAL CALCIUM) 500 MG chewable tablet Chew 2 tablets by mouth daily.        Marland Kitchen esomeprazole (NEXIUM) 40 MG capsule Take 40 mg by mouth daily before breakfast.        . estradiol (VIVELLE-DOT) 0.1 MG/24HR Place 1 patch onto the skin 2 (two) times a week.        . folic acid-pyridoxine-cyancobalamin (FOLTX) 2.5-25-2 MG TABS Take 1 tablet by mouth daily.        . furosemide (LASIX) 20 MG tablet Take 1 tablet (20 mg total) by mouth daily as needed.  90 tablet  4  . lidocaine (LIDODERM) 5 % Place 2 patches onto the skin daily. Remove & Discard patch within 12 hours or as directed by MD      . losartan-hydrochlorothiazide (HYZAAR) 100-25 MG per tablet Take 1 tablet by mouth daily.        . methylPREDNISolone (MEDROL DOSEPAK) 4 MG tablet Take by mouth. follow package directions      . mometasone (NASONEX) 50 MCG/ACT nasal spray Place 2 sprays into the nose daily.        Marland Kitchen oxyCODONE-acetaminophen (PERCOCET) 10-325 MG per tablet Take 1 tablet by mouth every 4 (four) hours as needed.      . potassium chloride (KLOR-CON) 10 MEQ CR tablet Take 10  mEq by mouth daily.       Marland Kitchen tiotropium (SPIRIVA) 18 MCG inhalation capsule Place 1 capsule (18 mcg total) into inhaler and inhale daily.  90 capsule  4  . vitamin E 400 UNIT capsule Take 400 Units by mouth daily.        Marland Kitchen DISCONTD: predniSONE (DELTASONE) 10 MG tablet 4 tabs for 2 days, then 3 tabs for 2 days, 2 tabs for 2 days, then 1 tab for 2 days, then stop  20 tablet  0  . DISCONTD: rOPINIRole (REQUIP) 2 MG tablet Take 1 mg by mouth at bedtime.

## 2011-12-19 NOTE — Assessment & Plan Note (Signed)
Gold C. COPD with recent exacerbation now improved Plan Maintain inhaled medications as prescribed Return 3 months

## 2012-01-02 ENCOUNTER — Ambulatory Visit: Payer: Medicare Other | Admitting: Critical Care Medicine

## 2012-01-13 IMAGING — CR DG CHEST 2V
2 series · 2 of 2 positions shown · non-contrast
Comparison: 12/26/2009

CLINICAL DATA: COPD with acute exacerbation

CHEST - 2 VIEW

[view not recorded (1 of 2)]
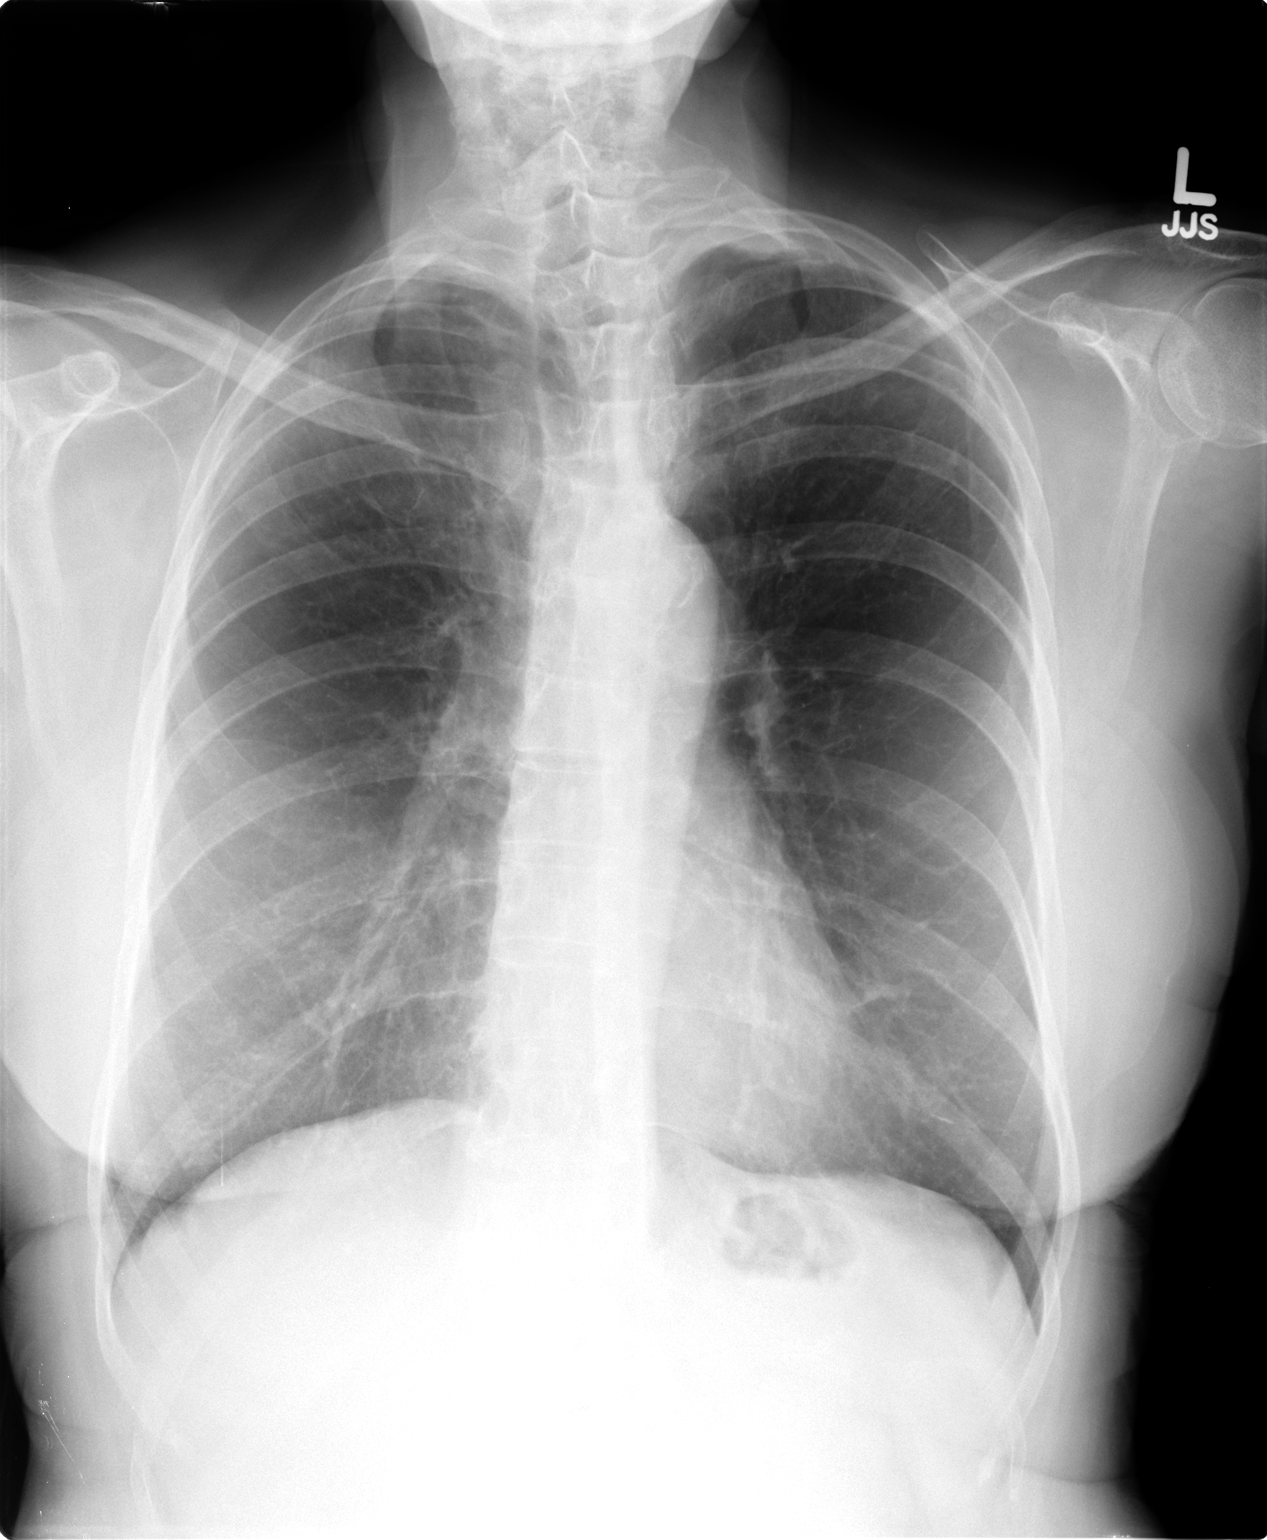

[view not recorded (2 of 2)]
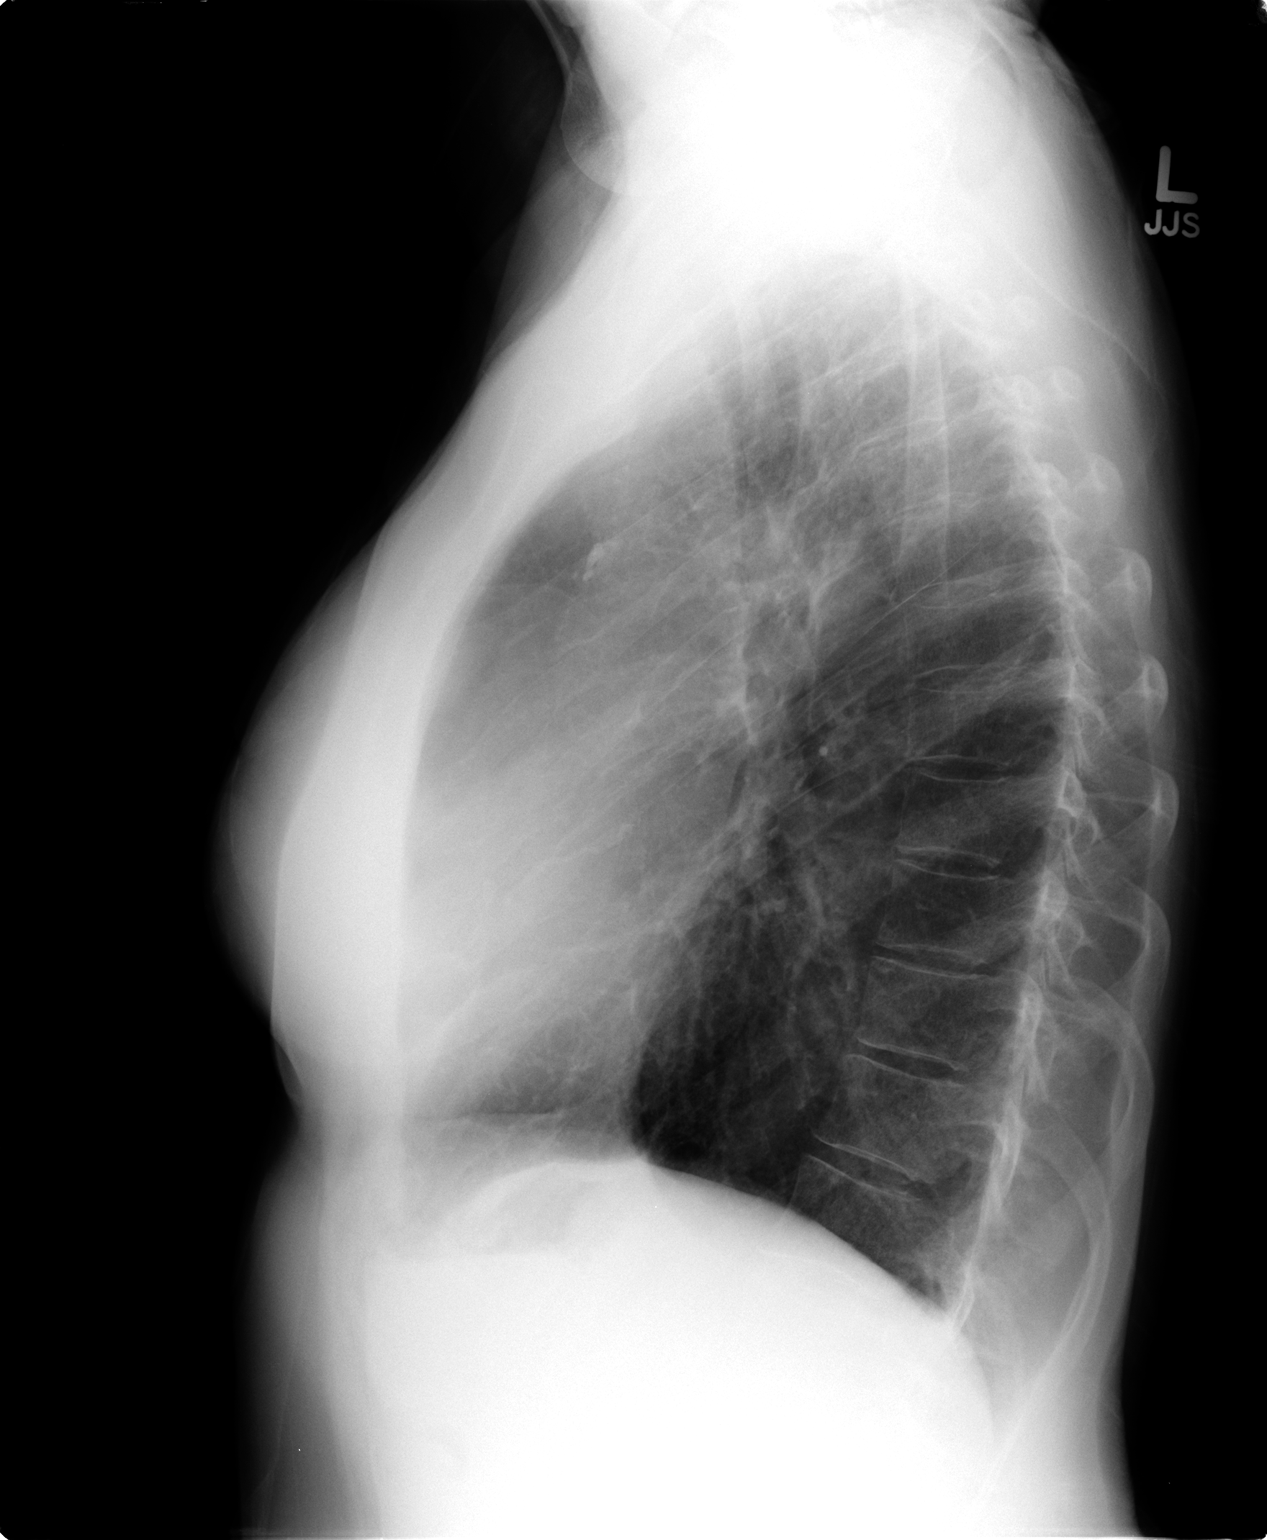

[2 of 2 positions shown; findings below may reference images not displayed]

FINDINGS: Heart size remains normal.  Lungs mildly hyperaerated but
clear.  No congestive heart failure or pleural fluid.  There is
some scarring in the left lower lobe or lingula.  This was present
previously.  Stable osteopenia.
IMPRESSION: COPD - no active disease or significant interval change.

## 2012-01-23 ENCOUNTER — Telehealth: Payer: Self-pay | Admitting: Critical Care Medicine

## 2012-01-23 NOTE — Telephone Encounter (Signed)
Called and spoke with pt to verify message below.  Pt states that her Neurologist, Dr. Despina Arias is wanting to start her on a new medication for her back pain- Oxycontin - pt unaware of dosage at this time. Pt has done some research on the medication and has some concerns regarding the medication and her health. She read that with her having COPD it might interact with her disease and cause complications and she was wanting your recommendations and advice before agreeing to take this medication first. She states that she has tolerated oxycodone and fentanyl patches in the past with no problems. 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 Pt aware the PW out of office until 01/27/12- pt okay to wait for response until then. Please advise.

## 2012-01-27 NOTE — Telephone Encounter (Signed)
I spoke with pt and is aware of PW recs. She states she is going to discuss this with her pcp. She needed nothing further

## 2012-01-27 NOTE — Telephone Encounter (Signed)
I am not sure this is the best choice for her d/t prior ETOH hx and also Copd severity She should discuss with her PCP MD

## 2012-03-19 ENCOUNTER — Ambulatory Visit (INDEPENDENT_AMBULATORY_CARE_PROVIDER_SITE_OTHER): Payer: Medicare Other | Admitting: Critical Care Medicine

## 2012-03-19 ENCOUNTER — Encounter: Payer: Self-pay | Admitting: Critical Care Medicine

## 2012-03-19 VITALS — BP 136/70 | HR 75 | Temp 98.1°F | Ht 61.0 in | Wt 117.5 lb

## 2012-03-19 DIAGNOSIS — J449 Chronic obstructive pulmonary disease, unspecified: Secondary | ICD-10-CM

## 2012-03-19 NOTE — Assessment & Plan Note (Signed)
Chronic obstructive lung disease with primary emphysematous component gold stage C. stable at this time Plan Maintain inhaled medications as prescribed Return 4 months

## 2012-03-19 NOTE — Progress Notes (Signed)
Subjective:    Patient ID: Tina [name], female    DOB: [birth year], 78 y.o.   MRN: [id number]  HPI    PCP [facility]  78 y.o.   white female with history of chronic obstructive lung disease with asthmatic bronchitis and emphysematous component. Gold Stage III     12/03/11 Acute OV  Complains of increased SOB, wheezing, tightness in chest, orange-colored mucus, sore throat  X 2 days.  Leaving for [facility] in few days .  No hemoptysis or chest pain. No edema.  OTC not working.  No change in meds-remains on spiriva and symbicort.    12/19/2011 Pt seen 5/7 per TP and rx zpak and pred.  Pt went to beach, seemed better then came home and worse and more low back pain .  Now more wheezing and cough   Notes more dyspnea as well. Pt denies any significant sore throat, nasal congestion or excess secretions, fever, chills, sweats, unintended weight loss, pleurtic or exertional chest pain, orthopnea PND, or leg swelling Pt denies any increase in rescue therapy over baseline, denies waking up needing it or having any early am or nocturnal exacerbations of coughing/wheezing/or dyspnea. Pt also denies any obvious fluctuation in symptoms with  weather or environmental change or other alleviating or aggravating factors   03/19/2012 ? If worse with dyspnea.  Issues with back pain and med adjustments.  Pt went to ED for severe dyspnea 4 weeks ago. ? If panic attack. All tests neg.  Now planning to see cohen of spine and scoliosis.  Now not much mucus.  Notes some edema in LE. Pt denies any significant sore throat, nasal congestion or excess secretions, fever, chills, sweats, unintended weight loss, pleurtic or exertional chest pain, orthopnea PND, or leg swelling Pt denies any increase in rescue therapy over baseline, denies waking up needing it or having any early am or nocturnal exacerbations of coughing/wheezing/or dyspnea. Pt also denies any obvious fluctuation in symptoms with  weather or  environmental change or other alleviating or aggravating factors     Past Medical History  Diagnosis Date  . Malignant melanoma     no recurrence on leg  . HTN (hypertension)   . COPD (chronic obstructive pulmonary disease)     FeV1 51% 4/09  . Allergic rhinitis   . Degenerative arthritis of foot      Family History  Problem Relation Age of Onset  . Diabetes    . Alzheimer's disease       History   Social History  . Marital Status: Married    Spouse Name: N/A    Number of Children: N/A  . Years of Education: N/A   Occupational History  . Not on file.   Social History Main Topics  . Smoking status: Former Smoker -- 0.3 packs/day for 30 years    Types: Cigarettes    Quit date: 07/29/1982  . Smokeless tobacco: Never Used  . Alcohol Use: Not on file  . Drug Use: Not on file  . Sexually Active: Not on file   Other Topics Concern  . Not on file   Social History Narrative   patient signed designated party release granting access to [facility] to husband [name]  detailled message may be left on home phone [name]  May 10, 2010 12:06 PM     Allergies  Allergen Reactions  . Doxycycline     REACTION: insomnia  . Penicillins     REACTION: shock  Outpatient Prescriptions Prior to Visit  Medication Sig Dispense Refill  . albuterol (PROAIR HFA) 108 (90 BASE) MCG/ACT inhaler Inhale 2 puffs into the lungs every 6 (six) hours as needed for shortness of breath.  3 Inhaler  3  . atorvastatin (LIPITOR) 10 MG tablet Take 10 mg by mouth daily.        . budesonide-formoterol (SYMBICORT) 160-4.5 MCG/ACT inhaler 2 puffs twice a day  3 Inhaler  4  . calcium carbonate (TUMS - DOSED IN MG ELEMENTAL CALCIUM) 500 MG chewable tablet Chew 2 tablets by mouth daily.        Marland Kitchen esomeprazole (NEXIUM) 40 MG capsule Take 40 mg by mouth daily before breakfast.        . estradiol (VIVELLE-DOT) 0.1 MG/24HR Place 1 patch onto the skin 2 (two) times a week.        . folic  acid-pyridoxine-cyancobalamin (FOLTX) 2.5-25-2 MG TABS Take 1 tablet by mouth daily.        . furosemide (LASIX) 20 MG tablet Take 1 tablet (20 mg total) by mouth daily as needed.  90 tablet  4  . lidocaine (LIDODERM) 5 % Place 2 patches onto the skin daily. Remove & Discard patch within 12 hours or as directed by MD      . losartan-hydrochlorothiazide (HYZAAR) 100-25 MG per tablet Take 1 tablet by mouth daily.        . mometasone (NASONEX) 50 MCG/ACT nasal spray Place 2 sprays into the nose daily.        Marland Kitchen oxyCODONE-acetaminophen (PERCOCET) 10-325 MG per tablet Take 1 tablet by mouth. Five times daily as needed      . potassium chloride (KLOR-CON) 10 MEQ CR tablet Take 20 mEq by mouth daily.       Marland Kitchen tiotropium (SPIRIVA) 18 MCG inhalation capsule Place 1 capsule (18 mcg total) into inhaler and inhale daily.  90 capsule  4  . vitamin E 400 UNIT capsule Take 400 Units by mouth daily.        . methylPREDNISolone (MEDROL DOSEPAK) 4 MG tablet Take by mouth. follow package directions          Review of Systems  Constitutional:   No  weight loss, night sweats,  Fevers, chills,  +fatigue, lassitude. HEENT:   No headaches,  Difficulty swallowing,  Tooth/dental problems,  Sore throat,                No sneezing, itching, ear ache, + nasal congestion, post nasal drip, , hoarseness   CV:  No chest pain,  Orthopnea, PND, swelling in lower extremities, anasarca, dizziness, palpitations  GI  No heartburn, indigestion, abdominal pain, nausea, vomiting, diarrhea, change in bowel habits, loss of appetite  Resp:  ,  No coughing up of blood.   No chest wall deformity  Skin: no rash or lesions.  GU: no dysuria, change in color of urine, no urgency or frequency.  No flank pain.  MS:  No joint pain or swelling.  No decreased range of motion.     Psych:  No change in mood or affect. No depression or anxiety.  No memory loss.     Objective:   Physical Exam  Filed Vitals:   03/19/12 1337  BP: 136/70    Pulse: 75  Temp: 98.1 F (36.7 C)  TempSrc: Oral  Height: 5\' 1"  (1.549 m)  Weight: 117 lb 8 oz (53.298 kg)  SpO2: 98%    Gen: Pleasant, thin elderly female , in no  distress,  normal affect  ENT: No lesions,  mouth clear,  oropharynx clear, faint clear postnasal drip  Neck: No JVD, no TMG, no carotid bruits  Lungs: distant BS  dullness to percussion,distant BS  Cardiovascular: RRR, heart sounds normal, no murmur or gallops, no peripheral edema  Abdomen: soft and NT, no HSM,  BS normal  Musculoskeletal: No deformities, no cyanosis or clubbing  Neuro: alert, non focal  Skin: Warm, no lesions or rashes      PFT Conversion 05/10/2010  FVC 2.13  FVC PREDICT 2.50  FVC  % Predicted 85  FEV1 1.04  FEV1 PREDICT 1.74  FEV % Predicted 59  FEV1/FVC 48.8  FEV1/FVC PRE 71  FEV1/FVC%EXP 69  FeF 25-75 0.42  FeF 25-75 % Predicted 2.05  FEF % EXPEC 20    Assessment & Plan:   COPD Chronic obstructive lung disease with primary emphysematous component gold stage C. stable at this time Plan Maintain inhaled medications as prescribed Return 4 months       Updated Medication List Outpatient Encounter Prescriptions as of 03/19/2012  Medication Sig Dispense Refill  . albuterol (PROAIR HFA) 108 (90 BASE) MCG/ACT inhaler Inhale 2 puffs into the lungs every 6 (six) hours as needed for shortness of breath.  3 Inhaler  3  . atorvastatin (LIPITOR) 10 MG tablet Take 10 mg by mouth daily.        . budesonide-formoterol (SYMBICORT) 160-4.5 MCG/ACT inhaler 2 puffs twice a day  3 Inhaler  4  . calcium carbonate (TUMS - DOSED IN MG ELEMENTAL CALCIUM) 500 MG chewable tablet Chew 2 tablets by mouth daily.        Marland Kitchen esomeprazole (NEXIUM) 40 MG capsule Take 40 mg by mouth daily before breakfast.        . estradiol (VIVELLE-DOT) 0.1 MG/24HR Place 1 patch onto the skin 2 (two) times a week.        . folic acid-pyridoxine-cyancobalamin (FOLTX) 2.5-25-2 MG TABS Take 1 tablet by mouth daily.         . furosemide (LASIX) 20 MG tablet Take 1 tablet (20 mg total) by mouth daily as needed.  90 tablet  4  . lidocaine (LIDODERM) 5 % Place 2 patches onto the skin daily. Remove & Discard patch within 12 hours or as directed by MD      . losartan-hydrochlorothiazide (HYZAAR) 100-25 MG per tablet Take 1 tablet by mouth daily.        . mometasone (NASONEX) 50 MCG/ACT nasal spray Place 2 sprays into the nose daily.        Marland Kitchen oxyCODONE-acetaminophen (PERCOCET) 10-325 MG per tablet Take 1 tablet by mouth. Five times daily as needed      . potassium chloride (KLOR-CON) 10 MEQ CR tablet Take 20 mEq by mouth daily.       Marland Kitchen rOPINIRole (REQUIP) 2 MG tablet Take 1 mg by mouth at bedtime as needed.      . tiotropium (SPIRIVA) 18 MCG inhalation capsule Place 1 capsule (18 mcg total) into inhaler and inhale daily.  90 capsule  4  . vitamin E 400 UNIT capsule Take 400 Units by mouth daily.        Marland Kitchen DISCONTD: methylPREDNISolone (MEDROL DOSEPAK) 4 MG tablet Take by mouth. follow package directions

## 2012-03-19 NOTE — Patient Instructions (Addendum)
No change in medications. Return in         4 months 

## 2012-04-14 ENCOUNTER — Encounter: Payer: Self-pay | Admitting: Critical Care Medicine

## 2012-06-18 ENCOUNTER — Encounter: Payer: Self-pay | Admitting: Adult Health

## 2012-06-18 ENCOUNTER — Other Ambulatory Visit: Payer: Self-pay | Admitting: Adult Health

## 2012-06-18 ENCOUNTER — Ambulatory Visit (INDEPENDENT_AMBULATORY_CARE_PROVIDER_SITE_OTHER): Payer: Medicare Other | Admitting: Adult Health

## 2012-06-18 ENCOUNTER — Ambulatory Visit (INDEPENDENT_AMBULATORY_CARE_PROVIDER_SITE_OTHER)
Admission: RE | Admit: 2012-06-18 | Discharge: 2012-06-18 | Disposition: A | Discharge status: Home / Self Care | Payer: Medicare Other | Source: Ambulatory Visit | Attending: Adult Health | Admitting: Adult Health

## 2012-06-18 VITALS — BP 116/62 | HR 93 | Temp 98.4°F | Ht 62.0 in | Wt 124.8 lb

## 2012-06-18 DIAGNOSIS — J449 Chronic obstructive pulmonary disease, unspecified: Secondary | ICD-10-CM

## 2012-06-18 MED ORDER — LEVALBUTEROL HCL 0.63 MG/3ML IN NEBU
0.6300 mg | INHALATION_SOLUTION | Freq: Once | RESPIRATORY_TRACT | Status: AC
  Administered 2012-06-18: 0.63 mg via RESPIRATORY_TRACT

## 2012-06-18 MED ORDER — AZITHROMYCIN 250 MG PO TABS: ORAL_TABLET | ORAL | Status: AC

## 2012-06-18 NOTE — Addendum Note (Signed)
Addended by: Boone Master E on: 06/18/2012 05:15 PM   Modules accepted: Orders

## 2012-06-18 NOTE — Progress Notes (Signed)
Subjective:    Patient ID: Tina [name], female    DOB: 1934/02/24, 78 y.o.   MRN: [id number]  HPI PCP [facility]  78 y.o.   white female with history of chronic obstructive lung disease with asthmatic bronchitis and emphysematous component. Gold Stage III     12/03/11 Acute OV  Complains of increased SOB, wheezing, tightness in chest, orange-colored mucus, sore throat  X 2 days.  Leaving for [facility] in few days .  No hemoptysis or chest pain. No edema.  OTC not working.  No change in meds-remains on spiriva and symbicort.    12/19/2011 Pt seen 5/7 per TP and rx zpak and pred.  Pt went to beach, seemed better then came home and worse and more low back pain .  Now more wheezing and cough   Notes more dyspnea as well.   03/19/2012 ? If worse with dyspnea.  Issues with back pain and med adjustments.  Pt went to ED for severe dyspnea 4 weeks ago. ? If panic attack. All tests neg.  Now planning to see cohen of spine and scoliosis.  Now not much mucus.  Notes some edema in LE.  >>no changes    06/18/2012 Acute OV  Complains of increased SOB, wheezing, head congestion w/ bloody bloody drainage, PND, tightness in chest x2 days.  Increased SABA use.  No fever , chest pain or increased edema.  No otc used.   Had spine surgery 6 weeks ago, in recovery . Still has pain.   Past Medical History  Diagnosis Date  . Malignant melanoma     no recurrence on leg  . HTN (hypertension)   . COPD (chronic obstructive pulmonary disease)     FeV1 51% 4/09  . Allergic rhinitis   . Degenerative arthritis of foot      Family History  Problem Relation Age of Onset  . Diabetes    . Alzheimer's disease       History   Social History  . Marital Status: Married    Spouse Name: N/A    Number of Children: N/A  . Years of Education: N/A   Occupational History  . Not on file.   Social History Main Topics  . Smoking status: Former Smoker -- 0.3 packs/day for 30 years    Types:  Cigarettes    Quit date: 07/29/1982  . Smokeless tobacco: Never Used  . Alcohol Use: Not on file  . Drug Use: Not on file  . Sexually Active: Not on file   Other Topics Concern  . Not on file   Social History Narrative   patient signed designated party release granting access to [facility] to husband [name]  detailled message may be left on home phone [name]  May 10, 2010 12:06 PM     Allergies  Allergen Reactions  . Doxycycline     REACTION: insomnia  . Penicillins     REACTION: shock     Outpatient Prescriptions Prior to Visit  Medication Sig Dispense Refill  . albuterol (PROAIR HFA) 108 (90 BASE) MCG/ACT inhaler Inhale 2 puffs into the lungs every 6 (six) hours as needed for shortness of breath.  3 Inhaler  3  . atorvastatin (LIPITOR) 10 MG tablet Take 10 mg by mouth daily.        . budesonide-formoterol (SYMBICORT) 160-4.5 MCG/ACT inhaler 2 puffs twice a day  3 Inhaler  4  . calcium carbonate (TUMS - DOSED IN MG ELEMENTAL CALCIUM) 500 MG  chewable tablet Chew 2 tablets by mouth daily.        Marland Kitchen esomeprazole (NEXIUM) 40 MG capsule Take 40 mg by mouth daily before breakfast.        . folic acid-pyridoxine-cyancobalamin (FOLTX) 2.5-25-2 MG TABS Take 1 tablet by mouth daily.        . furosemide (LASIX) 20 MG tablet Take 1 tablet (20 mg total) by mouth daily as needed.  90 tablet  4  . lidocaine (LIDODERM) 5 % Place 2 patches onto the skin daily. Remove & Discard patch within 12 hours or as directed by MD      . losartan-hydrochlorothiazide (HYZAAR) 100-25 MG per tablet Take 1 tablet by mouth daily.        . mometasone (NASONEX) 50 MCG/ACT nasal spray Place 2 sprays into the nose daily.        Marland Kitchen oxyCODONE-acetaminophen (PERCOCET) 10-325 MG per tablet Take 1 tablet by mouth. Five times daily as needed      . potassium chloride (KLOR-CON) 10 MEQ CR tablet Take 20 mEq by mouth daily.       Marland Kitchen rOPINIRole (REQUIP) 2 MG tablet Take 1 mg by mouth at bedtime as needed.      .  tiotropium (SPIRIVA) 18 MCG inhalation capsule Place 1 capsule (18 mcg total) into inhaler and inhale daily.  90 capsule  4  . vitamin E 400 UNIT capsule Take 400 Units by mouth daily.        Marland Kitchen estradiol (VIVELLE-DOT) 0.1 MG/24HR Place 1 patch onto the skin 2 (two) times a week.            Review of Systems  Constitutional:   No  weight loss, night sweats,  Fevers, chills,  +fatigue, lassitude. HEENT:   No headaches,  Difficulty swallowing,  Tooth/dental problems,  Sore throat,                No sneezing, itching, ear ache, + nasal congestion, post nasal drip, , hoarseness   CV:  No chest pain,  Orthopnea, PND, swelling in lower extremities, anasarca, dizziness, palpitations  GI  No heartburn, indigestion, abdominal pain, nausea, vomiting, diarrhea, change in bowel habits, loss of appetite  Resp:  ,  No coughing up of blood.   No chest wall deformity  Skin: no rash or lesions.  GU: no dysuria, change in color of urine, no urgency or frequency.  No flank pain.  MS:  No joint pain or swelling.  No decreased range of motion.     Psych:  No change in mood or affect. No depression or anxiety.  No memory loss.     Objective:   Physical Exam    Gen: Pleasant, thin elderly female , in no distress,  normal affect  ENT: No lesions,  mouth clear,  oropharynx clear, faint clear postnasal drip  Neck: No JVD, no TMG, no carotid bruits  Lungs: distant BS  dullness to percussion,distant BS  Cardiovascular: RRR, heart sounds normal, no murmur or gallops, no peripheral edema  Abdomen: soft and NT, no HSM,  BS normal  Musculoskeletal: No deformities, no cyanosis or clubbing  Neuro: alert, non focal  Skin: Warm, no lesions or rashes      PFT Conversion 05/10/2010  FVC 2.13  FVC PREDICT 2.50  FVC  % Predicted 85  FEV1 1.04  FEV1 PREDICT 1.74  FEV % Predicted 59  FEV1/FVC 48.8  FEV1/FVC PRE 71  FEV1/FVC%EXP 69  FeF 25-75 0.42  FeF 25-75 %  Predicted 2.05  FEF % EXPEC 20     Assessment & Plan:   No problem-specific assessment & plan notes found for this encounter.      Updated Medication List Outpatient Encounter Prescriptions as of 06/18/2012  Medication Sig Dispense Refill  . acetaminophen (TYLENOL) 500 MG tablet Take 500 mg by mouth every 6 (six) hours as needed.      Marland Kitchen albuterol (PROAIR HFA) 108 (90 BASE) MCG/ACT inhaler Inhale 2 puffs into the lungs every 6 (six) hours as needed for shortness of breath.  3 Inhaler  3  . atorvastatin (LIPITOR) 10 MG tablet Take 10 mg by mouth daily.        . budesonide-formoterol (SYMBICORT) 160-4.5 MCG/ACT inhaler 2 puffs twice a day  3 Inhaler  4  . calcium carbonate (TUMS - DOSED IN MG ELEMENTAL CALCIUM) 500 MG chewable tablet Chew 2 tablets by mouth daily.        Marland Kitchen esomeprazole (NEXIUM) 40 MG capsule Take 40 mg by mouth daily before breakfast.        . folic acid-pyridoxine-cyancobalamin (FOLTX) 2.5-25-2 MG TABS Take 1 tablet by mouth daily.        . furosemide (LASIX) 20 MG tablet Take 1 tablet (20 mg total) by mouth daily as needed.  90 tablet  4  . lidocaine (LIDODERM) 5 % Place 2 patches onto the skin daily. Remove & Discard patch within 12 hours or as directed by MD      . losartan-hydrochlorothiazide (HYZAAR) 100-25 MG per tablet Take 1 tablet by mouth daily.        . mometasone (NASONEX) 50 MCG/ACT nasal spray Place 2 sprays into the nose daily.        Marland Kitchen oxyCODONE (ROXICODONE) 15 MG immediate release tablet Take 15 mg by mouth every 6 (six) hours as needed.      Marland Kitchen oxyCODONE-acetaminophen (PERCOCET) 10-325 MG per tablet Take 1 tablet by mouth. Five times daily as needed      . potassium chloride (KLOR-CON) 10 MEQ CR tablet Take 20 mEq by mouth daily.       Marland Kitchen rOPINIRole (REQUIP) 2 MG tablet Take 1 mg by mouth at bedtime as needed.      . tiotropium (SPIRIVA) 18 MCG inhalation capsule Place 1 capsule (18 mcg total) into inhaler and inhale daily.  90 capsule  4  . vitamin E 400 UNIT capsule Take 400 Units by  mouth daily.        . [DISCONTINUED] estradiol (VIVELLE-DOT) 0.1 MG/24HR Place 1 patch onto the skin 2 (two) times a week.

## 2012-06-18 NOTE — Assessment & Plan Note (Signed)
Flare  Hold on steroids for now as recent extensive back surgery  Check xray today  xopenex neb in office   Plan Zpack as directed.  Mucinex DM Twice daily  As needed  Cough/congestion.  Fluids and rest  Please contact office for sooner follow up if symptoms do not improve or worsen or seek emergency care  Follow up Dr. Delford Field  In 2 months and As needed

## 2012-06-18 NOTE — Patient Instructions (Addendum)
Zpack as directed.  Mucinex DM Twice daily  As needed  Cough/congestion.  Fluids and rest  Please contact office for sooner follow up if symptoms do not improve or worsen or seek emergency care  Follow up Dr. Delford Field  In 2 months and As needed

## 2012-06-19 ENCOUNTER — Telehealth: Payer: Self-pay | Admitting: Critical Care Medicine

## 2012-06-19 NOTE — Telephone Encounter (Signed)
Result Notes     Notes Recorded by Christen Butter, CMA on 06/18/2012 at 5:30 PM LMTCB ------  Notes Recorded by Julio Sicks, NP on 06/18/2012 at 4:52 PM No sign of PNA  Cont w/ ov rec  Please contact office for sooner follow up if symptoms do not improve or worsen or seek emergency care     LMOM TCB x1.

## 2012-06-22 NOTE — Telephone Encounter (Signed)
Pt advised. Jennifer Castillo, CMA  

## 2012-06-22 NOTE — Telephone Encounter (Signed)
[id number] x 1 

## 2012-06-30 ENCOUNTER — Ambulatory Visit (INDEPENDENT_AMBULATORY_CARE_PROVIDER_SITE_OTHER): Payer: Medicare Other | Admitting: Critical Care Medicine

## 2012-06-30 ENCOUNTER — Encounter: Payer: Self-pay | Admitting: Critical Care Medicine

## 2012-06-30 VITALS — BP 106/56 | HR 65 | Temp 98.6°F | Ht 62.0 in | Wt 127.8 lb

## 2012-06-30 DIAGNOSIS — J449 Chronic obstructive pulmonary disease, unspecified: Secondary | ICD-10-CM

## 2012-06-30 DIAGNOSIS — J209 Acute bronchitis, unspecified: Secondary | ICD-10-CM

## 2012-06-30 DIAGNOSIS — R0902 Hypoxemia: Secondary | ICD-10-CM

## 2012-06-30 DIAGNOSIS — J9610 Chronic respiratory failure, unspecified whether with hypoxia or hypercapnia: Secondary | ICD-10-CM

## 2012-06-30 DIAGNOSIS — J9611 Chronic respiratory failure with hypoxia: Secondary | ICD-10-CM | POA: Insufficient documentation

## 2012-06-30 MED ORDER — ALBUTEROL SULFATE (2.5 MG/3ML) 0.083% IN NEBU: 2.5000 mg | INHALATION_SOLUTION | Freq: Four times a day (QID) | RESPIRATORY_TRACT | Status: DC | PRN

## 2012-06-30 MED ORDER — BUDESONIDE 0.25 MG/2ML IN SUSP: 0.2500 mg | Freq: Every day | RESPIRATORY_TRACT | Status: DC

## 2012-06-30 MED ORDER — FLUTTER DEVI: Status: DC

## 2012-06-30 MED ORDER — FORMOTEROL FUMARATE 20 MCG/2ML IN NEBU: 20.0000 ug | INHALATION_SOLUTION | Freq: Two times a day (BID) | RESPIRATORY_TRACT | Status: DC

## 2012-06-30 MED ORDER — MOXIFLOXACIN HCL 400 MG PO TABS: 400.0000 mg | ORAL_TABLET | Freq: Every day | ORAL | Status: DC

## 2012-06-30 MED ORDER — PREDNISONE 10 MG PO TABS: ORAL_TABLET | ORAL | Status: DC

## 2012-06-30 NOTE — Assessment & Plan Note (Signed)
Gold stage C. COPD with exacerbation and now hypoxemic respiratory failure Plan Prednisone 10mg  Take 4 tablets daily for 5 days then stop Avelox for 5 days one daily Stop symbicort Use nebulizer and use perforomist  Twice a day and budesonide once daily. Use albuterol in nebulizer 2 -3 times daily as needed [facility] will provide nebulizer medications Also you will need home oxygen 2liter rest 3 liter exertion Return High POint one week (next Thursday)

## 2012-06-30 NOTE — Patient Instructions (Addendum)
Prednisone 10mg  Take 4 tablets daily for 5 days then stop Avelox for 5 days one daily Stop symbicort Use nebulizer and use perforomist  Twice a day and budesonide once daily. Use albuterol in nebulizer 2 -3 times daily as needed [facility] will provide nebulizer medications Also you will need home oxygen 2liter rest 3 liter exertion Return High POint one week (next Thursday)

## 2012-06-30 NOTE — Assessment & Plan Note (Signed)
Chronic respiratory failure with hypoxemia Review COPD assessment

## 2012-06-30 NOTE — Progress Notes (Signed)
Subjective:    Patient ID: Tina [name], female    DOB: 04/23/1934, 78 y.o.   MRN: [id number]  HPI PCP [facility]  78 y.o.   white female with history of chronic obstructive lung disease with asthmatic bronchitis and emphysematous component. Gold Stage III     12/03/11 Acute OV  Complains of increased SOB, wheezing, tightness in chest, orange-colored mucus, sore throat  X 2 days.  Leaving for [facility] in few days .  No hemoptysis or chest pain. No edema.  OTC not working.  No change in meds-remains on spiriva and symbicort.    12/19/2011 Pt seen 5/7 per TP and rx zpak and pred.  Pt went to beach, seemed better then came home and worse and more low back pain .  Now more wheezing and cough   Notes more dyspnea as well.   03/19/2012 ? If worse with dyspnea.  Issues with back pain and med adjustments.  Pt went to ED for severe dyspnea 4 weeks ago. ? If panic attack. All tests neg.  Now planning to see cohen of spine and scoliosis.  Now not much mucus.  Notes some edema in LE.  >>no changes    11/21 Acute OV  Complains of increased SOB, wheezing, head congestion w/ bloody bloody drainage, PND, tightness in chest x2 days.  Increased SABA use.  No fever , chest pain or increased edema.  No otc used.   Had spine surgery 6 weeks ago, in recovery . Still has pain.   06/30/2012 Seen by NP 11/21.  Zpak Rx .  Just had major back surgery 04/28/12 No real improvement.  Symptoms: more wheezing, more dyspnea. Not able to walk but short distance, is very wiped out. Mucus now black , clear mucus now.  Feels congested in lungs.  Past Medical History  Diagnosis Date  . Malignant melanoma     no recurrence on leg  . HTN (hypertension)   . COPD (chronic obstructive pulmonary disease)     FeV1 51% 4/09  . Allergic rhinitis   . Degenerative arthritis of foot      Family History  Problem Relation Age of Onset  . Diabetes    . Alzheimer's disease       History   Social History  .  Marital Status: Married    Spouse Name: N/A    Number of Children: N/A  . Years of Education: N/A   Occupational History  . Not on file.   Social History Main Topics  . Smoking status: Former Smoker -- 0.3 packs/day for 30 years    Types: Cigarettes    Quit date: 07/29/1982  . Smokeless tobacco: Never Used  . Alcohol Use: Not on file  . Drug Use: Not on file  . Sexually Active: Not on file   Other Topics Concern  . Not on file   Social History Narrative   patient signed designated party release granting access to [facility] to husband [name]  detailled message may be left on home phone [name]  May 10, 2010 12:06 PM     Allergies  Allergen Reactions  . Doxycycline     REACTION: insomnia  . Penicillins     REACTION: shock     Outpatient Prescriptions Prior to Visit  Medication Sig Dispense Refill  . acetaminophen (TYLENOL) 500 MG tablet Take 500 mg by mouth every 6 (six) hours as needed.      Marland Kitchen albuterol (PROAIR HFA) 108 (90 BASE) MCG/ACT  inhaler Inhale 2 puffs into the lungs every 6 (six) hours as needed for shortness of breath.  3 Inhaler  3  . atorvastatin (LIPITOR) 10 MG tablet Take 10 mg by mouth daily.        . calcium carbonate (TUMS - DOSED IN MG ELEMENTAL CALCIUM) 500 MG chewable tablet Chew 2 tablets by mouth daily.        Marland Kitchen esomeprazole (NEXIUM) 40 MG capsule Take 40 mg by mouth daily before breakfast.        . folic acid-pyridoxine-cyancobalamin (FOLTX) 2.5-25-2 MG TABS Take 1 tablet by mouth daily.        . furosemide (LASIX) 20 MG tablet Take 1 tablet (20 mg total) by mouth daily as needed.  90 tablet  4  . losartan-hydrochlorothiazide (HYZAAR) 100-25 MG per tablet Take 1 tablet by mouth daily.        . mometasone (NASONEX) 50 MCG/ACT nasal spray Place 2 sprays into the nose daily.        Marland Kitchen oxyCODONE (ROXICODONE) 15 MG immediate release tablet Take 15 mg by mouth every 6 (six) hours as needed.      . potassium chloride (KLOR-CON) 10 MEQ CR tablet Take 20  mEq by mouth daily.       Marland Kitchen rOPINIRole (REQUIP) 2 MG tablet Take 1 mg by mouth at bedtime as needed.      . tiotropium (SPIRIVA) 18 MCG inhalation capsule Place 1 capsule (18 mcg total) into inhaler and inhale daily.  90 capsule  4  . [DISCONTINUED] budesonide-formoterol (SYMBICORT) 160-4.5 MCG/ACT inhaler 2 puffs twice a day  3 Inhaler  4  . lidocaine (LIDODERM) 5 % ON HOLD      . [DISCONTINUED] oxyCODONE-acetaminophen (PERCOCET) 10-325 MG per tablet Take 1 tablet by mouth. Five times daily as needed      . [DISCONTINUED] vitamin E 400 UNIT capsule Take 400 Units by mouth daily.        Last reviewed on 06/30/2012 10:03 AM by Storm Frisk, MD    Review of Systems  Constitutional:   No  weight loss, night sweats,  Fevers, chills,  +fatigue, lassitude. HEENT:   No headaches,  Difficulty swallowing,  Tooth/dental problems,  Sore throat,                No sneezing, itching, ear ache, + nasal congestion, post nasal drip, , hoarseness   CV:  No chest pain,  Orthopnea, PND, swelling in lower extremities, anasarca, dizziness, palpitations  GI  No heartburn, indigestion, abdominal pain, nausea, vomiting, diarrhea, change in bowel habits, loss of appetite  Resp:  ,  No coughing up of blood.   No chest wall deformity  Skin: no rash or lesions.  GU: no dysuria, change in color of urine, no urgency or frequency.  No flank pain.  MS:  No joint pain or swelling.  No decreased range of motion.     Psych:  No change in mood or affect. No depression or anxiety.  No memory loss.     Objective:   Physical Exam  BP 106/56  Pulse 65  Temp 98.6 F (37 C) (Oral)  Ht 5\' 2"  (1.575 m)  Wt 127 lb 12.8 oz (57.97 kg)  BMI 23.38 kg/m2  SpO2 94%    Gen: Pleasant, thin elderly female , in no distress,  normal affect  ENT: No lesions,  mouth clear,  oropharynx clear, faint clear postnasal drip  Neck: No JVD, no TMG, no carotid bruits  Lungs: distant BS  dullness to percussion,distant BS, expired  wheezes and poor airflow  Cardiovascular: RRR, heart sounds normal, no murmur or gallops, no peripheral edema  Abdomen: soft and NT, no HSM,  BS normal  Musculoskeletal: No deformities, no cyanosis or clubbing  Neuro: alert, non focal  Skin: Warm, no lesions or rashes       Assessment & Plan:   COPD Golds C Gold stage C. COPD with exacerbation and now hypoxemic respiratory failure Plan Prednisone 10mg  Take 4 tablets daily for 5 days then stop Avelox for 5 days one daily Stop symbicort Use nebulizer and use perforomist  Twice a day and budesonide once daily. Use albuterol in nebulizer 2 -3 times daily as needed [facility] will provide nebulizer medications Also you will need home oxygen 2liter rest 3 liter exertion Return High POint one week (next Thursday)   Chronic respiratory failure with hypoxia Chronic respiratory failure with hypoxemia Review COPD assessment        Updated Medication List Outpatient Encounter Prescriptions as of 06/30/2012  Medication Sig Dispense Refill  . acetaminophen (TYLENOL) 500 MG tablet Take 500 mg by mouth every 6 (six) hours as needed.      Marland Kitchen albuterol (PROAIR HFA) 108 (90 BASE) MCG/ACT inhaler Inhale 2 puffs into the lungs every 6 (six) hours as needed for shortness of breath.  3 Inhaler  3  . atorvastatin (LIPITOR) 10 MG tablet Take 10 mg by mouth daily.        . calcium carbonate (TUMS - DOSED IN MG ELEMENTAL CALCIUM) 500 MG chewable tablet Chew 2 tablets by mouth daily.        Marland Kitchen esomeprazole (NEXIUM) 40 MG capsule Take 40 mg by mouth daily before breakfast.        . folic acid-pyridoxine-cyancobalamin (FOLTX) 2.5-25-2 MG TABS Take 1 tablet by mouth daily.        . furosemide (LASIX) 20 MG tablet Take 1 tablet (20 mg total) by mouth daily as needed.  90 tablet  4  . gabapentin (NEURONTIN) 100 MG capsule Take 100 mg by mouth 3 (three) times daily.      Marland Kitchen losartan-hydrochlorothiazide (HYZAAR) 100-25 MG per tablet Take 1 tablet by mouth daily.         . mometasone (NASONEX) 50 MCG/ACT nasal spray Place 2 sprays into the nose daily.        Marland Kitchen oxyCODONE (ROXICODONE) 15 MG immediate release tablet Take 15 mg by mouth every 6 (six) hours as needed.      . potassium chloride (KLOR-CON) 10 MEQ CR tablet Take 20 mEq by mouth daily.       Marland Kitchen rOPINIRole (REQUIP) 2 MG tablet Take 1 mg by mouth at bedtime as needed.      . tiotropium (SPIRIVA) 18 MCG inhalation capsule Place 1 capsule (18 mcg total) into inhaler and inhale daily.  90 capsule  4  . [DISCONTINUED] budesonide-formoterol (SYMBICORT) 160-4.5 MCG/ACT inhaler 2 puffs twice a day  3 Inhaler  4  . albuterol (PROVENTIL) (2.5 MG/3ML) 0.083% nebulizer solution Take 3 mLs (2.5 mg total) by nebulization every 6 (six) hours as needed for wheezing.  75 mL  12  . budesonide (PULMICORT) 0.25 MG/2ML nebulizer solution Take 2 mLs (0.25 mg total) by nebulization daily.  60 mL  12  . formoterol (PERFOROMIST) 20 MCG/2ML nebulizer solution Take 2 mLs (20 mcg total) by nebulization 2 (two) times daily.  120 mL  6  . lidocaine (LIDODERM) 5 % ON HOLD      .  moxifloxacin (AVELOX) 400 MG tablet Take 1 tablet (400 mg total) by mouth daily.  5 tablet  0  . predniSONE (DELTASONE) 10 MG tablet Take 4 tablets daily for 5 days then stop  20 tablet  0  . Respiratory Therapy Supplies (FLUTTER) DEVI Use 3-4 times daily  1 each  0  . [DISCONTINUED] oxyCODONE-acetaminophen (PERCOCET) 10-325 MG per tablet Take 1 tablet by mouth. Five times daily as needed      . [DISCONTINUED] vitamin E 400 UNIT capsule Take 400 Units by mouth daily.

## 2012-07-01 ENCOUNTER — Telehealth: Payer: Self-pay | Admitting: Critical Care Medicine

## 2012-07-01 NOTE — Telephone Encounter (Signed)
Spoke with pt's spouse He states that the pt received her o2 and neb machine and supplies Still waiting on rxs for budesonide, perforomist, and albuterol neb sol Looks like the prescriptions were printed Will forward to Crystal to see if she faxed them and if so where to? Please advise thanks!

## 2012-07-02 ENCOUNTER — Telehealth: Payer: Self-pay | Admitting: Critical Care Medicine

## 2012-07-02 MED ORDER — BUDESONIDE 0.25 MG/2ML IN SUSP: 0.2500 mg | Freq: Every day | RESPIRATORY_TRACT | Status: DC

## 2012-07-02 MED ORDER — FORMOTEROL FUMARATE 20 MCG/2ML IN NEBU: 20.0000 ug | INHALATION_SOLUTION | Freq: Two times a day (BID) | RESPIRATORY_TRACT | Status: DC

## 2012-07-02 MED ORDER — ALBUTEROL SULFATE (2.5 MG/3ML) 0.083% IN NEBU: 2.5000 mg | INHALATION_SOLUTION | Freq: Four times a day (QID) | RESPIRATORY_TRACT | Status: DC | PRN

## 2012-07-02 NOTE — Telephone Encounter (Signed)
Nothing further is needed at this time.  

## 2012-07-02 NOTE — Telephone Encounter (Signed)
No we did not get any rx's Tobe Sos

## 2012-07-02 NOTE — Telephone Encounter (Signed)
Rxs reprinted as PCCs states they did not fax to [facility].  Will have PW sign then fax to [facility] at [phone].

## 2012-07-02 NOTE — Telephone Encounter (Signed)
Yes, I just faxed the neb rxs to [facility].  Called, spoke with pt.  She is aware of this and aware I will call [facility] now to have them take care of this STAT.  She verbalized understanding and was very appreciative of this.  Called [facility], spoke with Victorino Dike in the Pharmacy.  Per Victorino Dike, she did receive the neb rxs, and states pt will received meds today.  Called, spoke with pt.  She is aware of this and will call back if she hasn't received them or heard from [facility] by tomorrow am.

## 2012-07-02 NOTE — Telephone Encounter (Signed)
No rxs were given to me to fax.  I don't recall letting pt go.  PCCs, do you know if the rxs were given to you to be sent to [facility]?  Per OV instructions, PW wanted these sent to [facility].

## 2012-07-02 NOTE — Telephone Encounter (Signed)
Pt calling again in ref to previous msg can be reached at [phone].Tina [name]

## 2012-07-02 NOTE — Telephone Encounter (Signed)
Crystal have you faxed these rx yet?

## 2012-07-02 NOTE — Telephone Encounter (Signed)
Error.Tina [name] ° °

## 2012-07-09 ENCOUNTER — Encounter: Payer: Self-pay | Admitting: Critical Care Medicine

## 2012-07-09 ENCOUNTER — Ambulatory Visit (INDEPENDENT_AMBULATORY_CARE_PROVIDER_SITE_OTHER): Payer: Medicare Other | Admitting: Critical Care Medicine

## 2012-07-09 VITALS — BP 108/62 | HR 110 | Temp 98.2°F | Ht 62.0 in | Wt 122.0 lb

## 2012-07-09 DIAGNOSIS — J449 Chronic obstructive pulmonary disease, unspecified: Secondary | ICD-10-CM

## 2012-07-09 NOTE — Patient Instructions (Addendum)
Stay on oxygen at night , as needed daytime No change in other medications Stay on nebulizer Return 2 months

## 2012-07-09 NOTE — Progress Notes (Signed)
Subjective:    Patient ID: Tina [name], female    DOB: [birth year], 78 y.o.   MRN: [id number]  HPI PCP [facility]  78 y.o.   white female with history of chronic obstructive lung disease with asthmatic bronchitis and emphysematous component. Gold Stage III     12/03/11 Acute OV  Complains of increased SOB, wheezing, tightness in chest, orange-colored mucus, sore throat  X 2 days.  Leaving for [facility] in few days .  No hemoptysis or chest pain. No edema.  OTC not working.  No change in meds-remains on spiriva and symbicort.    12/19/2011 Pt seen 5/7 per TP and rx zpak and pred.  Pt went to beach, seemed better then came home and worse and more low back pain .  Now more wheezing and cough   Notes more dyspnea as well.   03/19/2012 ? If worse with dyspnea.  Issues with back pain and med adjustments.  Pt went to ED for severe dyspnea 4 weeks ago. ? If panic attack. All tests neg.  Now planning to see cohen of spine and scoliosis.  Now not much mucus.  Notes some edema in LE.  >>no changes    11/21 Acute OV  Complains of increased SOB, wheezing, head congestion w/ bloody bloody drainage, PND, tightness in chest x2 days.  Increased SABA use.  No fever , chest pain or increased edema.  No otc used.   Had spine surgery 6 weeks ago, in recovery . Still has pain.   06/30/2012 Seen by NP 11/21.  Zpak Rx .  Just had major back surgery 04/28/12 No real improvement.  Symptoms: more wheezing, more dyspnea. Not able to walk but short distance, is very wiped out. Mucus now black , clear mucus now.  Feels congested in lungs.   07/09/2012 The patient is markedly better since the last office visit. There is less mucus. There is less off. Pt did cough up with neb med, is better.  Pt is less dyspneic. Not gasping as before. No real chest pain. Now off abx.  Off prednisone  Past Medical History  Diagnosis Date  . Malignant melanoma     no recurrence on leg  . HTN (hypertension)   . COPD  (chronic obstructive pulmonary disease)     FeV1 51% 4/09  . Allergic rhinitis   . Degenerative arthritis of foot      Family History  Problem Relation Age of Onset  . Diabetes    . Alzheimer's disease       History   Social History  . Marital Status: Married    Spouse Name: N/A    Number of Children: N/A  . Years of Education: N/A   Occupational History  . Not on file.   Social History Main Topics  . Smoking status: Former Smoker -- 0.3 packs/day for 30 years    Types: Cigarettes    Quit date: 07/29/1982  . Smokeless tobacco: Never Used  . Alcohol Use: Not on file  . Drug Use: Not on file  . Sexually Active: Not on file   Other Topics Concern  . Not on file   Social History Narrative   patient signed designated party release granting access to [facility] to husband [name]  detailled message may be left on home phone [name]  May 10, 2010 12:06 PM     Allergies  Allergen Reactions  . Doxycycline     REACTION: insomnia  . Penicillins  REACTION: shock     Outpatient Prescriptions Prior to Visit  Medication Sig Dispense Refill  . acetaminophen (TYLENOL) 500 MG tablet Take 500 mg by mouth every 6 (six) hours as needed.      Marland Kitchen albuterol (PROAIR HFA) 108 (90 BASE) MCG/ACT inhaler Inhale 2 puffs into the lungs every 6 (six) hours as needed for shortness of breath.  3 Inhaler  3  . albuterol (PROVENTIL) (2.5 MG/3ML) 0.083% nebulizer solution Take 3 mLs (2.5 mg total) by nebulization every 6 (six) hours as needed for wheezing. Dx code 496  75 mL  12  . atorvastatin (LIPITOR) 10 MG tablet Take 10 mg by mouth daily.        . budesonide (PULMICORT) 0.25 MG/2ML nebulizer solution Take 2 mLs (0.25 mg total) by nebulization daily.  60 mL  12  . calcium carbonate (TUMS - DOSED IN MG ELEMENTAL CALCIUM) 500 MG chewable tablet Chew 2 tablets by mouth daily.        Marland Kitchen esomeprazole (NEXIUM) 40 MG capsule Take 40 mg by mouth daily before breakfast.        . folic  acid-pyridoxine-cyancobalamin (FOLTX) 2.5-25-2 MG TABS Take 1 tablet by mouth daily.        . formoterol (PERFOROMIST) 20 MCG/2ML nebulizer solution Take 2 mLs (20 mcg total) by nebulization 2 (two) times daily. Dx code 496  120 mL  6  . furosemide (LASIX) 20 MG tablet Take 1 tablet (20 mg total) by mouth daily as needed.  90 tablet  4  . gabapentin (NEURONTIN) 100 MG capsule Take 100 mg by mouth 3 (three) times daily.      Marland Kitchen lidocaine (LIDODERM) 5 % ON HOLD      . losartan-hydrochlorothiazide (HYZAAR) 100-25 MG per tablet Take 1 tablet by mouth daily.        . mometasone (NASONEX) 50 MCG/ACT nasal spray Place 2 sprays into the nose daily.        Marland Kitchen oxyCODONE (ROXICODONE) 15 MG immediate release tablet Take 15 mg by mouth every 6 (six) hours as needed.      . potassium chloride (KLOR-CON) 10 MEQ CR tablet Take 20 mEq by mouth daily.       Marland Kitchen Respiratory Therapy Supplies (FLUTTER) DEVI Use 3-4 times daily  1 each  0  . rOPINIRole (REQUIP) 2 MG tablet Take 1 mg by mouth at bedtime as needed.      . tiotropium (SPIRIVA) 18 MCG inhalation capsule Place 1 capsule (18 mcg total) into inhaler and inhale daily.  90 capsule  4  . [DISCONTINUED] moxifloxacin (AVELOX) 400 MG tablet Take 1 tablet (400 mg total) by mouth daily.  5 tablet  0  . [DISCONTINUED] predniSONE (DELTASONE) 10 MG tablet Take 4 tablets daily for 5 days then stop  20 tablet  0  Last reviewed on 07/09/2012 11:44 AM by Storm Frisk, MD    Review of Systems  Constitutional:   No  weight loss, night sweats,  Fevers, chills,  +fatigue, lassitude. HEENT:   No headaches,  Difficulty swallowing,  Tooth/dental problems,  Sore throat,                No sneezing, itching, ear ache, + nasal congestion, post nasal drip, , hoarseness   CV:  No chest pain,  Orthopnea, PND, swelling in lower extremities, anasarca, dizziness, palpitations  GI  No heartburn, indigestion, abdominal pain, nausea, vomiting, diarrhea, change in bowel habits, loss of  appetite  Resp:  ,  No coughing up of blood.   No chest wall deformity  Skin: no rash or lesions.  GU: no dysuria, change in color of urine, no urgency or frequency.  No flank pain.  MS:  No joint pain or swelling.  No decreased range of motion.     Psych:  No change in mood or affect. No depression or anxiety.  No memory loss.     Objective:   Physical Exam  BP 108/62  Pulse 110  Temp 98.2 F (36.8 C)  Ht 5\' 2"  (1.575 m)  Wt 122 lb (55.339 kg)  BMI 22.31 kg/m2  SpO2 96%    Gen: Pleasant, thin elderly female , in no distress,  normal affect  ENT: No lesions,  mouth clear,  oropharynx clear, faint clear postnasal drip  Neck: No JVD, no TMG, no carotid bruits  Lungs: distant BS  dullness to percussion,distant BS without expiratory wheezes  Cardiovascular: RRR, heart sounds normal, no murmur or gallops, no peripheral edema  Abdomen: soft and NT, no HSM,  BS normal  Musculoskeletal: No deformities, no cyanosis or clubbing  Neuro: alert, non focal  Skin: Warm, no lesions or rashes       Assessment & Plan:   COPD Golds C Golds stage C. COPD with exacerbation and hypoxemic respiratory failure Plan Maintain off systemic steroids and antibiotics Maintain oxygen nocturnally and as needed during the day Maintain nebulizer  therapy       Updated Medication List Outpatient Encounter Prescriptions as of 07/09/2012  Medication Sig Dispense Refill  . acetaminophen (TYLENOL) 500 MG tablet Take 500 mg by mouth every 6 (six) hours as needed.      Marland Kitchen albuterol (PROAIR HFA) 108 (90 BASE) MCG/ACT inhaler Inhale 2 puffs into the lungs every 6 (six) hours as needed for shortness of breath.  3 Inhaler  3  . albuterol (PROVENTIL) (2.5 MG/3ML) 0.083% nebulizer solution Take 3 mLs (2.5 mg total) by nebulization every 6 (six) hours as needed for wheezing. Dx code 496  75 mL  12  . atorvastatin (LIPITOR) 10 MG tablet Take 10 mg by mouth daily.        . budesonide (PULMICORT) 0.25  MG/2ML nebulizer solution Take 2 mLs (0.25 mg total) by nebulization daily.  60 mL  12  . calcium carbonate (TUMS - DOSED IN MG ELEMENTAL CALCIUM) 500 MG chewable tablet Chew 2 tablets by mouth daily.        Marland Kitchen esomeprazole (NEXIUM) 40 MG capsule Take 40 mg by mouth daily before breakfast.        . folic acid-pyridoxine-cyancobalamin (FOLTX) 2.5-25-2 MG TABS Take 1 tablet by mouth daily.        . formoterol (PERFOROMIST) 20 MCG/2ML nebulizer solution Take 2 mLs (20 mcg total) by nebulization 2 (two) times daily. Dx code 496  120 mL  6  . furosemide (LASIX) 20 MG tablet Take 1 tablet (20 mg total) by mouth daily as needed.  90 tablet  4  . gabapentin (NEURONTIN) 100 MG capsule Take 100 mg by mouth 3 (three) times daily.      Marland Kitchen lidocaine (LIDODERM) 5 % ON HOLD      . losartan-hydrochlorothiazide (HYZAAR) 100-25 MG per tablet Take 1 tablet by mouth daily.        . mometasone (NASONEX) 50 MCG/ACT nasal spray Place 2 sprays into the nose daily.        Marland Kitchen oxyCODONE (ROXICODONE) 15 MG immediate release tablet Take 15 mg by mouth every  6 (six) hours as needed.      . potassium chloride (KLOR-CON) 10 MEQ CR tablet Take 20 mEq by mouth daily.       Marland Kitchen Respiratory Therapy Supplies (FLUTTER) DEVI Use 3-4 times daily  1 each  0  . rOPINIRole (REQUIP) 2 MG tablet Take 1 mg by mouth at bedtime as needed.      . tiotropium (SPIRIVA) 18 MCG inhalation capsule Place 1 capsule (18 mcg total) into inhaler and inhale daily.  90 capsule  4  . [DISCONTINUED] moxifloxacin (AVELOX) 400 MG tablet Take 1 tablet (400 mg total) by mouth daily.  5 tablet  0  . [DISCONTINUED] predniSONE (DELTASONE) 10 MG tablet Take 4 tablets daily for 5 days then stop  20 tablet  0

## 2012-07-09 NOTE — Assessment & Plan Note (Signed)
Golds stage C. COPD with exacerbation and hypoxemic respiratory failure Plan Maintain off systemic steroids and antibiotics Maintain oxygen nocturnally and as needed during the day Maintain nebulizer  therapy

## 2012-07-14 ENCOUNTER — Other Ambulatory Visit: Payer: Self-pay | Admitting: Critical Care Medicine

## 2012-07-14 MED ORDER — BUDESONIDE-FORMOTEROL FUMARATE 160-4.5 MCG/ACT IN AERO: 2.0000 | INHALATION_SPRAY | Freq: Two times a day (BID) | RESPIRATORY_TRACT | Status: DC

## 2012-07-14 NOTE — Telephone Encounter (Signed)
Rx has been sent to [facility].

## 2012-07-23 ENCOUNTER — Telehealth: Payer: Self-pay | Admitting: Critical Care Medicine

## 2012-07-23 DIAGNOSIS — J9611 Chronic respiratory failure with hypoxia: Secondary | ICD-10-CM

## 2012-07-23 DIAGNOSIS — J449 Chronic obstructive pulmonary disease, unspecified: Secondary | ICD-10-CM

## 2012-07-23 NOTE — Telephone Encounter (Signed)
Order placed to [facility] for heated humidity; pt requested this be delivered today, order reflected this request.  Pt aware to try the saline rinse and call if her symptoms do not improve or worsen.  Will sign off.

## 2012-07-23 NOTE — Telephone Encounter (Signed)
At 12.12.13 ov w/ PW, pt's O2 was increased for QHS to PRN during the day thru [facility].  Called spoke with patient who reported that for the past week she has been "dry as bone" w/ dark red to bloody drainage from her nose, an occasional nose bleed, head congestion, chills/sweats.  Denies PND, cough, SOB, wheezing.  Has been using saline nasal spray without much relief.  Pt does not have heated humidity w/ her O2.  Advised pt may use Ayr saline gel to help keep her nose moist.  Dr Delford Field, pt is requesting further recs and an order for heated humidity.  Thanks.

## 2012-07-23 NOTE — Telephone Encounter (Signed)
Pt called back again. Tina [name] °

## 2012-07-23 NOTE — Telephone Encounter (Signed)
Use saline rinse in the nose tid Obtain heated humidity for home oxygen concentrator

## 2012-07-24 ENCOUNTER — Telehealth: Payer: Self-pay | Admitting: Critical Care Medicine

## 2012-07-24 MED ORDER — FORMOTEROL FUMARATE 20 MCG/2ML IN NEBU: 20.0000 ug | INHALATION_SOLUTION | Freq: Two times a day (BID) | RESPIRATORY_TRACT | Status: DC

## 2012-07-24 MED ORDER — BUDESONIDE 0.25 MG/2ML IN SUSP: 0.2500 mg | Freq: Every day | RESPIRATORY_TRACT | Status: DC

## 2012-07-24 NOTE — Telephone Encounter (Signed)
Pt states she has not received her humidity for her concentrator and needs refills on her Budesonide and Perforomist through [facility] as well.  RX for neb meds called to [facility] pharmacy and hardcopy was faxed to [phone].  Also spoke with [facility] regarding the humidity and they were able to go into [employer] and see the order. They are printing this off since original was not received. Kriste Basque is aware pt would like to go nyu the office to pick the humidity up and will let Eunice Blase know at the [employer] on [address]. Pt will go pick this up today.  Pt will let our office know if she has any other issues.

## 2012-07-24 NOTE — Telephone Encounter (Signed)
(  continued)  2nd issue:  Pt states she needs refills on budesonide & performist.  Antionette Fairy

## 2012-07-30 ENCOUNTER — Telehealth: Payer: Self-pay | Admitting: Critical Care Medicine

## 2012-07-30 NOTE — Telephone Encounter (Signed)
Spoke with pt She states still having SOB and not feeling well overall since last ov with PW I offered ov with DA for this am, but she prefers tomorrow and so I have scheduled her to see Dr. Frederico Hamman at 9:45 am tomorrow I advised seek emergent care sooner if needed and she verbalized understanding

## 2012-07-31 ENCOUNTER — Ambulatory Visit (INDEPENDENT_AMBULATORY_CARE_PROVIDER_SITE_OTHER): Payer: Medicare Other | Admitting: Pulmonary Disease

## 2012-07-31 ENCOUNTER — Encounter: Payer: Self-pay | Admitting: Pulmonary Disease

## 2012-07-31 VITALS — BP 92/68 | HR 102 | Temp 96.7°F | Ht 62.0 in | Wt 117.0 lb

## 2012-07-31 DIAGNOSIS — J9610 Chronic respiratory failure, unspecified whether with hypoxia or hypercapnia: Secondary | ICD-10-CM

## 2012-07-31 DIAGNOSIS — J309 Allergic rhinitis, unspecified: Secondary | ICD-10-CM

## 2012-07-31 DIAGNOSIS — R0902 Hypoxemia: Secondary | ICD-10-CM

## 2012-07-31 DIAGNOSIS — J019 Acute sinusitis, unspecified: Secondary | ICD-10-CM | POA: Insufficient documentation

## 2012-07-31 DIAGNOSIS — J9611 Chronic respiratory failure with hypoxia: Secondary | ICD-10-CM

## 2012-07-31 DIAGNOSIS — J449 Chronic obstructive pulmonary disease, unspecified: Secondary | ICD-10-CM

## 2012-07-31 DIAGNOSIS — J329 Chronic sinusitis, unspecified: Secondary | ICD-10-CM

## 2012-07-31 MED ORDER — AZELASTINE HCL 0.1 % NA SOLN: 1.0000 | Freq: Two times a day (BID) | NASAL | Status: DC

## 2012-07-31 MED ORDER — LEVOFLOXACIN 500 MG PO TABS: 500.0000 mg | ORAL_TABLET | Freq: Every day | ORAL | Status: DC

## 2012-07-31 NOTE — Progress Notes (Signed)
Subjective:    Patient ID: Tina [name], female    DOB: 10/12/1933, 79 y.o.   MRN: [id number]  HPI PCP [facility]  79 y.o.   white female with history of chronic obstructive lung disease with asthmatic bronchitis and emphysematous component. Gold Stage III     12/03/11 Acute OV  Complains of increased SOB, wheezing, tightness in chest, orange-colored mucus, sore throat  X 2 days.  Leaving for [facility] in few days .  No hemoptysis or chest pain. No edema.  OTC not working.  No change in meds-remains on spiriva and symbicort.    12/19/2011 Pt seen 5/7 per TP and rx zpak and pred.  Pt went to beach, seemed better then came home and worse and more low back pain .  Now more wheezing and cough   Notes more dyspnea as well.   03/19/2012 ? If worse with dyspnea.  Issues with back pain and med adjustments.  Pt went to ED for severe dyspnea 4 weeks ago. ? If panic attack. All tests neg.  Now planning to see cohen of spine and scoliosis.  Now not much mucus.  Notes some edema in LE.  >>no changes    11/21 Acute OV  Complains of increased SOB, wheezing, head congestion w/ bloody bloody drainage, PND, tightness in chest x2 days.  Increased SABA use.  No fever , chest pain or increased edema.  No otc used.   Had spine surgery 6 weeks ago, in recovery . Still has pain.   06/30/2012 Seen by NP 11/21.  Zpak Rx .  Just had major back surgery 04/28/12 No real improvement.  Symptoms: more wheezing, more dyspnea. Not able to walk but short distance, is very wiped out. Mucus now black , clear mucus now.  Feels congested in lungs.   07/09/2012 The patient is markedly better since the last office visit. There is less mucus. There is less off. Pt did cough up with neb med, is better.  Pt is less dyspneic. Not gasping as before. No real chest pain. Now off abx.  Off prednisone   07/31/12  Returns to clinic with complains of nasal congestion, nasal discharge, nasal bleeding; when blowing her nose  she notices dry dark brown mucous, admits post nasal drip and dry cough; denies chest congestion; Has been using a home humidifier with the oxygen delivery system, nasal saline gel and uses daily natty pot for sinus rinses with no success. Denies headaches or sinus pressure, admits ear pops when blowing her nose, denies sore throat or LAD. Sometimes she has wheezing and chest tightness but not worse today, sometimes she notices LE edema but not today.   Past Medical History  Diagnosis Date  . Malignant melanoma     no recurrence on leg  . HTN (hypertension)   . COPD (chronic obstructive pulmonary disease)     FeV1 51% 4/09  . Allergic rhinitis   . Degenerative arthritis of foot      Family History  Problem Relation Age of Onset  . Diabetes    . Alzheimer's disease       History   Social History  . Marital Status: Married    Spouse Name: N/A    Number of Children: N/A  . Years of Education: N/A   Occupational History  . Not on file.   Social History Main Topics  . Smoking status: Former Smoker -- 0.3 packs/day for 30 years    Types: Cigarettes  Quit date: 07/29/1982  . Smokeless tobacco: Never Used  . Alcohol Use: Not on file  . Drug Use: Not on file  . Sexually Active: Not on file   Other Topics Concern  . Not on file   Social History Narrative   patient signed designated party release granting access to [facility] to husband [name]  detailled message may be left on home phone [name]  May 10, 2010 12:06 PM     Allergies  Allergen Reactions  . Doxycycline     REACTION: insomnia  . Penicillins     REACTION: shock     Outpatient Prescriptions Prior to Visit  Medication Sig Dispense Refill  . acetaminophen (TYLENOL) 500 MG tablet Take 500 mg by mouth every 6 (six) hours as needed.      Marland Kitchen albuterol (PROAIR HFA) 108 (90 BASE) MCG/ACT inhaler Inhale 2 puffs into the lungs every 6 (six) hours as needed for shortness of breath.  3 Inhaler  3  . albuterol  (PROVENTIL) (2.5 MG/3ML) 0.083% nebulizer solution Take 3 mLs (2.5 mg total) by nebulization every 6 (six) hours as needed for wheezing. Dx code 496  75 mL  12  . atorvastatin (LIPITOR) 10 MG tablet Take 10 mg by mouth daily.        . budesonide (PULMICORT) 0.25 MG/2ML nebulizer solution Take 2 mLs (0.25 mg total) by nebulization daily.  60 mL  5  . budesonide-formoterol (SYMBICORT) 160-4.5 MCG/ACT inhaler Inhale 2 puffs into the lungs 2 (two) times daily.  3 Inhaler  1  . calcium carbonate (TUMS - DOSED IN MG ELEMENTAL CALCIUM) 500 MG chewable tablet Chew 2 tablets by mouth daily.        Marland Kitchen esomeprazole (NEXIUM) 40 MG capsule Take 40 mg by mouth daily before breakfast.        . folic acid-pyridoxine-cyancobalamin (FOLTX) 2.5-25-2 MG TABS Take 1 tablet by mouth daily.        . formoterol (PERFOROMIST) 20 MCG/2ML nebulizer solution Take 2 mLs (20 mcg total) by nebulization 2 (two) times daily. Dx code 496  120 mL  5  . losartan-hydrochlorothiazide (HYZAAR) 100-25 MG per tablet Take 1 tablet by mouth daily.        . mometasone (NASONEX) 50 MCG/ACT nasal spray Place 2 sprays into the nose daily.        Marland Kitchen oxyCODONE (ROXICODONE) 15 MG immediate release tablet Take 15 mg by mouth every 6 (six) hours as needed.      . potassium chloride (KLOR-CON) 10 MEQ CR tablet Take 20 mEq by mouth daily.       Marland Kitchen Respiratory Therapy Supplies (FLUTTER) DEVI Use 3-4 times daily  1 each  0  . rOPINIRole (REQUIP) 2 MG tablet Take 1 mg by mouth at bedtime as needed.      . tiotropium (SPIRIVA) 18 MCG inhalation capsule Place 1 capsule (18 mcg total) into inhaler and inhale daily.  90 capsule  4  . furosemide (LASIX) 20 MG tablet Take 1 tablet (20 mg total) by mouth daily as needed.  90 tablet  4  . gabapentin (NEURONTIN) 100 MG capsule Take 100 mg by mouth 3 (three) times daily.      Marland Kitchen lidocaine (LIDODERM) 5 % ON HOLD      Last reviewed on 07/31/2012  9:46 AM by Caryl Ada, CMA  For complete ROS please view the note  written by Milinda Pointer  Objective:   Physical Exam  BP 92/68  Pulse 102  Temp 96.7 F (35.9 C) (Oral)  Ht 5\' 2"  (1.575 m)  Wt 117 lb (53.071 kg)  BMI 21.40 kg/m2  SpO2 98% General: Comfortable  Wearing oxygen Thin  HEENT ; pupils round and reactive to light; mild nasal mucosa edema and erythema; dry blood;  Retropharynx with mild erythema, no edema or exudate; no cervical LAD  Cardiovascular: s1s2, no murmurs. Regular rate and rhythm.  Respiratory: Decreased breath sounds bilaterally with no wheezing; No increased work of breathing.  Abdomen: soft NT ND BS+.   Extremities: no pedal edema. Homans negative  Skin dry  Central nervous system: Alert and oriented No focal neurological deficits.       Updated Medication List Outpatient Encounter Prescriptions as of 07/31/2012  Medication Sig Dispense Refill  . acetaminophen (TYLENOL) 500 MG tablet Take 500 mg by mouth every 6 (six) hours as needed.      Marland Kitchen albuterol (PROAIR HFA) 108 (90 BASE) MCG/ACT inhaler Inhale 2 puffs into the lungs every 6 (six) hours as needed for shortness of breath.  3 Inhaler  3  . albuterol (PROVENTIL) (2.5 MG/3ML) 0.083% nebulizer solution Take 3 mLs (2.5 mg total) by nebulization every 6 (six) hours as needed for wheezing. Dx code 496  75 mL  12  . atorvastatin (LIPITOR) 10 MG tablet Take 10 mg by mouth daily.        . budesonide (PULMICORT) 0.25 MG/2ML nebulizer solution Take 2 mLs (0.25 mg total) by nebulization daily.  60 mL  5  . budesonide-formoterol (SYMBICORT) 160-4.5 MCG/ACT inhaler Inhale 2 puffs into the lungs 2 (two) times daily.  3 Inhaler  1  . calcium carbonate (TUMS - DOSED IN MG ELEMENTAL CALCIUM) 500 MG chewable tablet Chew 2 tablets by mouth daily.        Marland Kitchen esomeprazole (NEXIUM) 40 MG capsule Take 40 mg by mouth daily before breakfast.        . folic acid-pyridoxine-cyancobalamin (FOLTX) 2.5-25-2 MG TABS Take 1 tablet by mouth daily.        . formoterol (PERFOROMIST) 20  MCG/2ML nebulizer solution Take 2 mLs (20 mcg total) by nebulization 2 (two) times daily. Dx code 496  120 mL  5  . losartan-hydrochlorothiazide (HYZAAR) 100-25 MG per tablet Take 1 tablet by mouth daily.        . mometasone (NASONEX) 50 MCG/ACT nasal spray Place 2 sprays into the nose daily.        Marland Kitchen oxyCODONE (ROXICODONE) 15 MG immediate release tablet Take 15 mg by mouth every 6 (six) hours as needed.      . potassium chloride (KLOR-CON) 10 MEQ CR tablet Take 20 mEq by mouth daily.       Marland Kitchen Respiratory Therapy Supplies (FLUTTER) DEVI Use 3-4 times daily  1 each  0  . rOPINIRole (REQUIP) 2 MG tablet Take 1 mg by mouth at bedtime as needed.      . tiotropium (SPIRIVA) 18 MCG inhalation capsule Place 1 capsule (18 mcg total) into inhaler and inhale daily.  90 capsule  4  . azelastine (ASTELIN) 137 MCG/SPRAY nasal spray Place 1 spray into the nose 2 (two) times daily. Use in each nostril as directed  30 mL  12  . furosemide (LASIX) 20 MG tablet Take 1 tablet (20 mg total) by mouth daily as needed.  90 tablet  4  . gabapentin (NEURONTIN) 100 MG capsule Take 100 mg by mouth 3 (three)  times daily.      Marland Kitchen levofloxacin (LEVAQUIN) 500 MG tablet Take 1 tablet (500 mg total) by mouth daily.  7 tablet  0  . lidocaine (LIDODERM) 5 % ON HOLD        A/P   Seems that her problems are related to rhinitis/sinusitis rather that COPD exacerbation. I advised her to continue humidifying the oxygen, nasal saline spray and natty pot rinses. Given that her nasal mucosa is very irritated and bleeding with Nasonex, I advised her to stop the Nasonex and initiate Astelin. (if the insurance company will deny payment we could try ipratropium). I advised her to initiate Levaquin for sinusitis. I would have liked to obtain a CT sinuses to evalute for sinusitis but she stated that she cannot lay flat for the test. If these interventions do not improve her symptoms would recommend ENT evaluation for sinus cultures.

## 2012-07-31 NOTE — Patient Instructions (Addendum)
Please stop Nasonex, start Astelin nasal spray; You might need an appointment with ENT in the future for sinus culture.   Please call back if not improving or if worsening symptoms, please present to the Emergency Room.   We will schedule appointment with Dr. Delford Field in 2 weeks.

## 2012-07-31 NOTE — Progress Notes (Signed)
  Subjective:    Patient ID: Tina [name], female    DOB: 11-Aug-1933, 79 y.o.   MRN: [id number]  HPI    Review of Systems  Constitutional: Negative for fever and unexpected weight change.  HENT: Positive for nosebleeds. Negative for ear pain, congestion, sore throat, sneezing, trouble swallowing, dental problem, postnasal drip and sinus pressure.   Eyes: Negative for redness and itching.  Respiratory: Positive for cough, shortness of breath and wheezing. Negative for chest tightness.   Cardiovascular: Negative for palpitations and leg swelling.  Gastrointestinal: Negative for nausea and vomiting.  Genitourinary: Negative for dysuria.  Musculoskeletal: Negative for joint swelling.  Skin: Negative for rash.  Neurological: Negative for headaches.  Hematological: Does not bruise/bleed easily.  Psychiatric/Behavioral: Negative for dysphoric mood. The patient is not nervous/anxious.        Objective:   Physical Exam        Assessment & Plan:

## 2012-08-20 ENCOUNTER — Ambulatory Visit (INDEPENDENT_AMBULATORY_CARE_PROVIDER_SITE_OTHER): Payer: Medicare Other | Admitting: Critical Care Medicine

## 2012-08-20 ENCOUNTER — Encounter: Payer: Self-pay | Admitting: Critical Care Medicine

## 2012-08-20 VITALS — BP 114/62 | HR 76 | Temp 98.2°F | Ht 62.0 in | Wt 122.0 lb

## 2012-08-20 DIAGNOSIS — J438 Other emphysema: Secondary | ICD-10-CM

## 2012-08-20 DIAGNOSIS — J019 Acute sinusitis, unspecified: Secondary | ICD-10-CM

## 2012-08-20 DIAGNOSIS — J449 Chronic obstructive pulmonary disease, unspecified: Secondary | ICD-10-CM

## 2012-08-20 DIAGNOSIS — J4489 Other specified chronic obstructive pulmonary disease: Secondary | ICD-10-CM

## 2012-08-20 DIAGNOSIS — J439 Emphysema, unspecified: Secondary | ICD-10-CM

## 2012-08-20 MED ORDER — LEVOFLOXACIN 500 MG PO TABS: 500.0000 mg | ORAL_TABLET | Freq: Every day | ORAL | Status: DC

## 2012-08-20 MED ORDER — MOMETASONE FUROATE 50 MCG/ACT NA SUSP: 2.0000 | Freq: Every day | NASAL | Status: DC

## 2012-08-20 MED ORDER — PREDNISONE 10 MG PO TABS: ORAL_TABLET | ORAL | Status: DC

## 2012-08-20 NOTE — Patient Instructions (Addendum)
Take prednisone 10mg  Take 4 tablets daily for 5 days then stop Take another round of levaquin one daily for 7days Use sinus rinse twice daily Stop astelin Start NAsonex two puff each nostril daily No other changes We will try to get a lightweight portable concentrator from [employer] Return 6 weeks

## 2012-08-20 NOTE — Progress Notes (Signed)
Subjective:    Patient ID: Tina [name], female    DOB: 11-28-1933, 79 y.o.   MRN: [id number]  HPI PCP [facility]  79 y.o.   white female with history of chronic obstructive lung disease with asthmatic bronchitis and emphysematous component. Gold Stage III     12/03/11 Acute OV  Complains of increased SOB, wheezing, tightness in chest, orange-colored mucus, sore throat  X 2 days.  Leaving for [facility] in few days .  No hemoptysis or chest pain. No edema.  OTC not working.  No change in meds-remains on spiriva and symbicort.    12/19/2011 Pt seen 5/7 per TP and rx zpak and pred.  Pt went to beach, seemed better then came home and worse and more low back pain .  Now more wheezing and cough   Notes more dyspnea as well.   03/19/2012 ? If worse with dyspnea.  Issues with back pain and med adjustments.  Pt went to ED for severe dyspnea 4 weeks ago. ? If panic attack. All tests neg.  Now planning to see cohen of spine and scoliosis.  Now not much mucus.  Notes some edema in LE.  >>no changes    11/21 Acute OV  Complains of increased SOB, wheezing, head congestion w/ bloody bloody drainage, PND, tightness in chest x2 days.  Increased SABA use.  No fever , chest pain or increased edema.  No otc used.   Had spine surgery 6 weeks ago, in recovery . Still has pain.   06/30/2012 Seen by NP 11/21.  Zpak Rx .  Just had major back surgery 04/28/12 No real improvement.  Symptoms: more wheezing, more dyspnea. Not able to walk but short distance, is very wiped out. Mucus now black , clear mucus now.  Feels congested in lungs.   07/09/2012 The patient is markedly better since the last office visit. There is less mucus. There is less off. Pt did cough up with neb med, is better.  Pt is less dyspneic. Not gasping as before. No real chest pain. Now off abx.  Off prednisone   07/31/12  Returns to clinic with complains of nasal congestion, nasal discharge, nasal bleeding; when blowing her nose  she notices dry dark brown mucous, admits post nasal drip and dry cough; denies chest congestion; Has been using a home humidifier with the oxygen delivery system, nasal saline gel and uses daily natty pot for sinus rinses with no success. Denies headaches or sinus pressure, admits ear pops when blowing her nose, denies sore throat or LAD. Sometimes she has wheezing and chest tightness but not worse today, sometimes she notices LE edema but not today.   08/20/2012 Pt seen by Dr Frederico Hamman 1/3 for flare:  Rx levaquin. ? Sinusitis. Did help some?  Mucus was excessive. Now: wheezing and cough.  Pt still has pndrip and nasal congestion.  Using Lloyd Huger Med rinse tid, ?flonase No pred given ,just ABX.    Still very dyspneic.  No real chest pain.    Review of Systems Constitutional:   No  weight loss, night sweats,  Fevers, chills, fatigue, lassitude. HEENT:   No headaches,  Difficulty swallowing,  Tooth/dental problems,  Sore throat,                No sneezing, itching, ear ache,+++  nasal congestion, +++post nasal drip,   CV:  No chest pain,  Orthopnea, PND, swelling in lower extremities, anasarca, dizziness, palpitations  GI  No heartburn, indigestion, abdominal  pain, nausea, vomiting, diarrhea, change in bowel habits, loss of appetite  Resp: +++ shortness of breath with exertion and  at rest.  Notes  excess mucus, notes  productive cough,  No non-productive cough,  No coughing up of blood.  No change in color of mucus.  No wheezing.  No chest wall deformity  Skin: no rash or lesions.  GU: no dysuria, change in color of urine, no urgency or frequency.  No flank pain.  MS:  No joint pain or swelling.  No decreased range of motion.  No back pain.  Psych:  No change in mood or affect. No depression or anxiety.  No memory loss.     Objective:   Physical Exam Filed Vitals:   08/20/12 1445  BP: 114/62  Pulse: 76  Temp: 98.2 F (36.8 C)  TempSrc: Oral  Height: 5\' 2"  (1.575 m)  Weight: 122 lb  (55.339 kg)  SpO2: 100%    Gen: Pleasant, well-nourished, in no distress,  normal affect  ENT: No lesions,  mouth clear,  oropharynx clear, no postnasal drip, left greater than right nasal purulence  Neck: No JVD, no TMG, no carotid bruits  Lungs: No use of accessory muscles, no dullness to percussion, distant breath sounds with expired wheezes Cardiovascular: RRR, heart sounds normal, no murmur or gallops, no peripheral edema  Abdomen: soft and NT, no HSM,  BS normal  Musculoskeletal: No deformities, no cyanosis or clubbing  Neuro: alert, non focal  Skin: Warm, no lesions or rashes         Assessment & Plan:   Sinusitis, acute Acute on chronic sinusitis left greater than right maxillary with associated COPD flare Plan Take prednisone 10mg  Take 4 tablets daily for 5 days then stop Take another round of levaquin one daily for 7days Use sinus rinse twice daily Stop astelin Start NAsonex two puff each nostril daily No other changes We will try to get a lightweight portable concentrator from [employer] Return 6 weeks   COPD Golds C Gold stage C. COPD with associated flare due to acute sinusitis   Updated Medication List Outpatient Encounter Prescriptions as of 08/20/2012  Medication Sig Dispense Refill  . acetaminophen (TYLENOL) 500 MG tablet Take 500 mg by mouth every 6 (six) hours as needed.      Marland Kitchen albuterol (PROAIR HFA) 108 (90 BASE) MCG/ACT inhaler Inhale 2 puffs into the lungs every 6 (six) hours as needed for shortness of breath.  3 Inhaler  3  . albuterol (PROVENTIL) (2.5 MG/3ML) 0.083% nebulizer solution Take 3 mLs (2.5 mg total) by nebulization every 6 (six) hours as needed for wheezing. Dx code 496  75 mL  12  . atorvastatin (LIPITOR) 10 MG tablet Take 10 mg by mouth daily.        . budesonide (PULMICORT) 0.25 MG/2ML nebulizer solution Take 2 mLs (0.25 mg total) by nebulization daily.  60 mL  5  . calcium carbonate (TUMS - DOSED IN MG ELEMENTAL CALCIUM) 500 MG  chewable tablet Chew 2 tablets by mouth daily.        Marland Kitchen esomeprazole (NEXIUM) 40 MG capsule Take 40 mg by mouth daily before breakfast.        . folic acid-pyridoxine-cyancobalamin (FOLTX) 2.5-25-2 MG TABS Take 1 tablet by mouth daily.        . formoterol (PERFOROMIST) 20 MCG/2ML nebulizer solution Take 2 mLs (20 mcg total) by nebulization 2 (two) times daily. Dx code 496  120 mL  5  . furosemide (  LASIX) 20 MG tablet Take 1 tablet (20 mg total) by mouth daily as needed.  90 tablet  4  . gabapentin (NEURONTIN) 100 MG capsule Take 100 mg by mouth 3 (three) times daily.      Marland Kitchen lidocaine (LIDODERM) 5 % ON HOLD      . losartan-hydrochlorothiazide (HYZAAR) 100-25 MG per tablet Take 1 tablet by mouth daily.        . mometasone (NASONEX) 50 MCG/ACT nasal spray Place 2 sprays into the nose daily.  17 g  6  . oxyCODONE (OXYCONTIN) 10 MG 12 hr tablet Take 10 mg by mouth every 6 (six) hours as needed.      . potassium chloride (KLOR-CON) 10 MEQ CR tablet Take 20 mEq by mouth daily.       Marland Kitchen Respiratory Therapy Supplies (FLUTTER) DEVI Use 3-4 times daily  1 each  0  . rOPINIRole (REQUIP) 2 MG tablet Take 1 mg by mouth at bedtime as needed.      . tiotropium (SPIRIVA) 18 MCG inhalation capsule Place 1 capsule (18 mcg total) into inhaler and inhale daily.  90 capsule  4  . [DISCONTINUED] azelastine (ASTELIN) 137 MCG/SPRAY nasal spray Place 1 spray into the nose 2 (two) times daily. Use in each nostril as directed  30 mL  12  . [DISCONTINUED] budesonide-formoterol (SYMBICORT) 160-4.5 MCG/ACT inhaler Inhale 2 puffs into the lungs 2 (two) times daily.  3 Inhaler  1  . [DISCONTINUED] levofloxacin (LEVAQUIN) 500 MG tablet Take 1 tablet (500 mg total) by mouth daily.  7 tablet  0  . [DISCONTINUED] mometasone (NASONEX) 50 MCG/ACT nasal spray Place 2 sprays into the nose daily.        . [DISCONTINUED] oxyCODONE (ROXICODONE) 15 MG immediate release tablet Take 15 mg by mouth every 6 (six) hours as needed.      Marland Kitchen  levofloxacin (LEVAQUIN) 500 MG tablet Take 1 tablet (500 mg total) by mouth daily.  7 tablet  0  . predniSONE (DELTASONE) 10 MG tablet Take 4 tablets daily for 5 days then stop  20 tablet  0

## 2012-08-21 NOTE — Assessment & Plan Note (Signed)
Gold stage C. COPD with associated flare due to acute sinusitis

## 2012-08-21 NOTE — Assessment & Plan Note (Signed)
Acute on chronic sinusitis left greater than right maxillary with associated COPD flare Plan Take prednisone 10mg  Take 4 tablets daily for 5 days then stop Take another round of levaquin one daily for 7days Use sinus rinse twice daily Stop astelin Start NAsonex two puff each nostril daily No other changes We will try to get a lightweight portable concentrator from [employer] Return 6 weeks

## 2012-08-24 ENCOUNTER — Ambulatory Visit: Payer: Medicare Other | Admitting: Critical Care Medicine

## 2012-08-27 ENCOUNTER — Telehealth: Payer: Self-pay | Admitting: Critical Care Medicine

## 2012-08-27 NOTE — Telephone Encounter (Signed)
Patient called and is curious if she is supposed to continue the Budesonide. I do not see anything in any recent notes stating that she is to being stopping the Budesonide nebs.  Patient aware that message is going to be sent to PW to get verification before a refills is sent.   Patient aware that PW not in office and we will contact her as soon as we have a response.  Dr Delford Field, please verify that patient is to stay on Budesonide and if okay for refills. Thanks.

## 2012-08-27 NOTE — Telephone Encounter (Signed)
Yes Stay on budesonide

## 2012-08-28 NOTE — Telephone Encounter (Signed)
Pt is aware. Nothing further was needed 

## 2012-09-10 ENCOUNTER — Ambulatory Visit: Payer: Medicare Other | Admitting: Critical Care Medicine

## 2012-09-24 ENCOUNTER — Telehealth: Payer: Self-pay | Admitting: Critical Care Medicine

## 2012-09-24 ENCOUNTER — Ambulatory Visit (INDEPENDENT_AMBULATORY_CARE_PROVIDER_SITE_OTHER): Payer: Medicare Other | Admitting: Critical Care Medicine

## 2012-09-24 ENCOUNTER — Encounter: Payer: Self-pay | Admitting: Critical Care Medicine

## 2012-09-24 VITALS — BP 110/66 | HR 104 | Temp 98.1°F | Ht 62.0 in | Wt 117.0 lb

## 2012-09-24 DIAGNOSIS — J449 Chronic obstructive pulmonary disease, unspecified: Secondary | ICD-10-CM

## 2012-09-24 DIAGNOSIS — J4489 Other specified chronic obstructive pulmonary disease: Secondary | ICD-10-CM

## 2012-09-24 MED ORDER — PREDNISONE 10 MG PO TABS: ORAL_TABLET | ORAL | Status: DC

## 2012-09-24 MED ORDER — ROPINIROLE HCL 2 MG PO TABS: ORAL_TABLET | ORAL | Status: DC

## 2012-09-24 MED ORDER — CIPROFLOXACIN HCL 500 MG PO TABS: 500.0000 mg | ORAL_TABLET | Freq: Two times a day (BID) | ORAL | Status: AC

## 2012-09-24 NOTE — Progress Notes (Signed)
Subjective:    Patient ID: Tina [name], female    DOB: 18-Oct-1933, 79 y.o.   MRN: [id number]  HPI  PCP [facility]  79 y.o.   white female with history of chronic obstructive lung disease with asthmatic bronchitis and emphysematous component. Gold Stage III   07/31/12  Returns to clinic with complains of nasal congestion, nasal discharge, nasal bleeding; when blowing her nose she notices dry dark brown mucous, admits post nasal drip and dry cough; denies chest congestion; Has been using a home humidifier with the oxygen delivery system, nasal saline gel and uses daily natty pot for sinus rinses with no success. Denies headaches or sinus pressure, admits ear pops when blowing her nose, denies sore throat or LAD. Sometimes she has wheezing and chest tightness but not worse today, sometimes she notices LE edema but not today.   08/20/2012 Pt seen by Dr Frederico Hamman 1/3 for flare:  Rx levaquin. ? Sinusitis. Did help some?  Mucus was excessive. Now: wheezing and cough.  Pt still has pndrip and nasal congestion.  Using Lloyd Huger Med rinse tid, ?flonase No pred given ,just ABX.    Still very dyspneic.  No real chest pain.  09/24/2012 Now coughing up thick dark green mucus and white foam, this has been an issue for one week No real chest pain. Notes some tightness.  Notes some pndrip clear.  Wash still gets yellow out of R nares.  No fever chills sweats. At last visit rx 7days of levaquin and pred. This helped the sinuses some.  Past Medical History  Diagnosis Date  . Malignant melanoma     no recurrence on leg  . HTN (hypertension)   . COPD (chronic obstructive pulmonary disease)     FeV1 51% 4/09  . Allergic rhinitis   . Degenerative arthritis of foot      Family History  Problem Relation Age of Onset  . Diabetes    . Alzheimer's disease       History   Social History  . Marital Status: Married    Spouse Name: N/A    Number of Children: N/A  . Years of Education: N/A   Occupational  History  . Not on file.   Social History Main Topics  . Smoking status: Former Smoker -- 0.30 packs/day for 30 years    Types: Cigarettes    Quit date: 07/29/1982  . Smokeless tobacco: Never Used  . Alcohol Use: Not on file  . Drug Use: Not on file  . Sexually Active: Not on file   Other Topics Concern  . Not on file   Social History Narrative   patient signed designated party release granting access to [facility] to husband [name]  detailled message may be left on home phone [name]  May 10, 2010 12:06 PM     Allergies  Allergen Reactions  . Doxycycline     REACTION: insomnia  . Penicillins     REACTION: shock     Outpatient Prescriptions Prior to Visit  Medication Sig Dispense Refill  . acetaminophen (TYLENOL) 500 MG tablet Take 500 mg by mouth every 6 (six) hours as needed.      Marland Kitchen albuterol (PROAIR HFA) 108 (90 BASE) MCG/ACT inhaler Inhale 2 puffs into the lungs every 6 (six) hours as needed for shortness of breath.  3 Inhaler  3  . albuterol (PROVENTIL) (2.5 MG/3ML) 0.083% nebulizer solution Take 3 mLs (2.5 mg total) by nebulization every 6 (six) hours as needed  for wheezing. Dx code 496  75 mL  12  . atorvastatin (LIPITOR) 10 MG tablet Take 10 mg by mouth daily.        . budesonide (PULMICORT) 0.25 MG/2ML nebulizer solution Take 2 mLs (0.25 mg total) by nebulization daily.  60 mL  5  . calcium carbonate (TUMS - DOSED IN MG ELEMENTAL CALCIUM) 500 MG chewable tablet Chew 2 tablets by mouth daily.        Marland Kitchen esomeprazole (NEXIUM) 40 MG capsule Take 40 mg by mouth daily before breakfast.        . folic acid-pyridoxine-cyancobalamin (FOLTX) 2.5-25-2 MG TABS Take 1 tablet by mouth daily.        . formoterol (PERFOROMIST) 20 MCG/2ML nebulizer solution Take 2 mLs (20 mcg total) by nebulization 2 (two) times daily. Dx code 496  120 mL  5  . furosemide (LASIX) 20 MG tablet Take 1 tablet (20 mg total) by mouth daily as needed.  90 tablet  4  . losartan-hydrochlorothiazide  (HYZAAR) 100-25 MG per tablet Take 1 tablet by mouth daily.        . mometasone (NASONEX) 50 MCG/ACT nasal spray Place 2 sprays into the nose daily.  17 g  6  . oxyCODONE (OXYCONTIN) 10 MG 12 hr tablet Take 10 mg by mouth every 6 (six) hours as needed.      . potassium chloride (KLOR-CON) 10 MEQ CR tablet Take 20 mEq by mouth daily.       Marland Kitchen Respiratory Therapy Supplies (FLUTTER) DEVI Use 3-4 times daily  1 each  0  . tiotropium (SPIRIVA) 18 MCG inhalation capsule Place 1 capsule (18 mcg total) into inhaler and inhale daily.  90 capsule  4  . rOPINIRole (REQUIP) 2 MG tablet Take 1 mg by mouth at bedtime as needed.      . lidocaine (LIDODERM) 5 % ON HOLD      . gabapentin (NEURONTIN) 100 MG capsule Take 100 mg by mouth 3 (three) times daily.      Marland Kitchen levofloxacin (LEVAQUIN) 500 MG tablet Take 1 tablet (500 mg total) by mouth daily.  7 tablet  0  . predniSONE (DELTASONE) 10 MG tablet Take 4 tablets daily for 5 days then stop  20 tablet  0   No facility-administered medications prior to visit.     Review of Systems  Constitutional:   No  weight loss, night sweats,  Fevers, chills, fatigue, lassitude. HEENT:   No headaches,  Difficulty swallowing,  Tooth/dental problems,  Sore throat,                No sneezing, itching, ear ache,+++  nasal congestion, +++post nasal drip,   CV:  No chest pain,  Orthopnea, PND, swelling in lower extremities, anasarca, dizziness, palpitations  GI  No heartburn, indigestion, abdominal pain, nausea, vomiting, diarrhea, change in bowel habits, loss of appetite  Resp: +++ shortness of breath with exertion and  at rest.  Notes  excess mucus, notes  productive cough,  No non-productive cough,  No coughing up of blood.  No change in color of mucus.  No wheezing.  No chest wall deformity  Skin: no rash or lesions.  GU: no dysuria, change in color of urine, no urgency or frequency.  No flank pain.  MS:  No joint pain or swelling.  No decreased range of motion.  No back  pain.  Psych:  No change in mood or affect. No depression or anxiety.  No memory loss.  Objective:   Physical Exam  Filed Vitals:   09/24/12 0912  BP: 110/66  Pulse: 104  Temp: 98.1 F (36.7 C)  TempSrc: Oral  Height: 5\' 2"  (1.575 m)  Weight: 117 lb (53.071 kg)  SpO2: 95%    Gen: Pleasant, well-nourished, in no distress,  normal affect  ENT: No lesions,  mouth clear,  oropharynx clear, no postnasal drip, left greater than right nasal purulence  Neck: No JVD, no TMG, no carotid bruits  Lungs: No use of accessory muscles, no dullness to percussion, distant breath sounds with expired wheezes Cardiovascular: RRR, heart sounds normal, no murmur or gallops, no peripheral edema  Abdomen: soft and NT, no HSM,  BS normal  Musculoskeletal: No deformities, no cyanosis or clubbing  Neuro: alert, non focal  Skin: Warm, no lesions or rashes     Assessment & Plan:   COPD Golds C Gold  stage C. COPD with frequent exacerbations and now yet another exacerbation occurring Plan Cipro 500 twice daily for 10days Prednisone 10mg  Take 4 for three days 3 for three days 2 for three days 1 for three days and stop HOLD requip while on cipro Use albuterol in nebulizer twice daily for 7 days in addition to other inhalers    Updated Medication List Outpatient Encounter Prescriptions as of 09/24/2012  Medication Sig Dispense Refill  . acetaminophen (TYLENOL) 500 MG tablet Take 500 mg by mouth every 6 (six) hours as needed.      Marland Kitchen albuterol (PROAIR HFA) 108 (90 BASE) MCG/ACT inhaler Inhale 2 puffs into the lungs every 6 (six) hours as needed for shortness of breath.  3 Inhaler  3  . albuterol (PROVENTIL) (2.5 MG/3ML) 0.083% nebulizer solution Take 3 mLs (2.5 mg total) by nebulization every 6 (six) hours as needed for wheezing. Dx code 496  75 mL  12  . atorvastatin (LIPITOR) 10 MG tablet Take 10 mg by mouth daily.        . budesonide (PULMICORT) 0.25 MG/2ML nebulizer solution Take 2 mLs  (0.25 mg total) by nebulization daily.  60 mL  5  . calcium carbonate (TUMS - DOSED IN MG ELEMENTAL CALCIUM) 500 MG chewable tablet Chew 2 tablets by mouth daily.        Marland Kitchen esomeprazole (NEXIUM) 40 MG capsule Take 40 mg by mouth daily before breakfast.        . ferrous sulfate 325 (65 FE) MG tablet Take 325 mg by mouth 3 (three) times daily.      . folic acid-pyridoxine-cyancobalamin (FOLTX) 2.5-25-2 MG TABS Take 1 tablet by mouth daily.        . formoterol (PERFOROMIST) 20 MCG/2ML nebulizer solution Take 2 mLs (20 mcg total) by nebulization 2 (two) times daily. Dx code 496  120 mL  5  . furosemide (LASIX) 20 MG tablet Take 1 tablet (20 mg total) by mouth daily as needed.  90 tablet  4  . losartan-hydrochlorothiazide (HYZAAR) 100-25 MG per tablet Take 1 tablet by mouth daily.        . mometasone (NASONEX) 50 MCG/ACT nasal spray Place 2 sprays into the nose daily.  17 g  6  . oxyCODONE (OXYCONTIN) 10 MG 12 hr tablet Take 10 mg by mouth every 6 (six) hours as needed.      . potassium chloride (KLOR-CON) 10 MEQ CR tablet Take 20 mEq by mouth daily.       Marland Kitchen Respiratory Therapy Supplies (FLUTTER) DEVI Use 3-4 times daily  1 each  0  .  rOPINIRole (REQUIP) 2 MG tablet HOLD WHILE ON CIPRO      . tiotropium (SPIRIVA) 18 MCG inhalation capsule Place 1 capsule (18 mcg total) into inhaler and inhale daily.  90 capsule  4  . [DISCONTINUED] rOPINIRole (REQUIP) 2 MG tablet Take 1 mg by mouth at bedtime as needed.      . ciprofloxacin (CIPRO) 500 MG tablet Take 1 tablet (500 mg total) by mouth 2 (two) times daily.  20 tablet  0  . lidocaine (LIDODERM) 5 % ON HOLD      . predniSONE (DELTASONE) 10 MG tablet Take 4 for three days 3 for three days 2 for three days 1 for three days and stop  30 tablet  0  . [DISCONTINUED] gabapentin (NEURONTIN) 100 MG capsule Take 100 mg by mouth 3 (three) times daily.      . [DISCONTINUED] levofloxacin (LEVAQUIN) 500 MG tablet Take 1 tablet (500 mg total) by mouth daily.  7 tablet  0   . [DISCONTINUED] predniSONE (DELTASONE) 10 MG tablet Take 4 tablets daily for 5 days then stop  20 tablet  0   No facility-administered encounter medications on file as of 09/24/2012.

## 2012-09-24 NOTE — Patient Instructions (Addendum)
Cipro 500 twice daily for 10days Prednisone 10mg  Take 4 for three days 3 for three days 2 for three days 1 for three days and stop HOLD requip while on cipro Use albuterol in nebulizer twice daily for 7 days in addition to other inhalers Meds sent to pharmacy downstairs Return 6 weeks

## 2012-09-24 NOTE — Assessment & Plan Note (Signed)
Gold  stage C. COPD with frequent exacerbations and now yet another exacerbation occurring Plan Cipro 500 twice daily for 10days Prednisone 10mg  Take 4 for three days 3 for three days 2 for three days 1 for three days and stop HOLD requip while on cipro Use albuterol in nebulizer twice daily for 7 days in addition to other inhalers

## 2012-09-24 NOTE — Telephone Encounter (Signed)
Pt is aware that it's possible to have this side effect but then again she might not. I advised her that if she experiences any side effects of this nature to call us ASAP. She agreed and understood.

## 2012-10-05 ENCOUNTER — Ambulatory Visit: Payer: Medicare Other | Admitting: Critical Care Medicine

## 2012-10-15 ENCOUNTER — Ambulatory Visit (HOSPITAL_BASED_OUTPATIENT_CLINIC_OR_DEPARTMENT_OTHER)
Admission: RE | Admit: 2012-10-15 | Discharge: 2012-10-15 | Disposition: A | Discharge status: Home / Self Care | Payer: Medicare Other | Source: Ambulatory Visit | Attending: Critical Care Medicine | Admitting: Critical Care Medicine

## 2012-10-15 ENCOUNTER — Ambulatory Visit (INDEPENDENT_AMBULATORY_CARE_PROVIDER_SITE_OTHER): Payer: Medicare Other | Admitting: Critical Care Medicine

## 2012-10-15 ENCOUNTER — Encounter: Payer: Self-pay | Admitting: Critical Care Medicine

## 2012-10-15 VITALS — BP 122/62 | HR 98 | Temp 98.1°F | Ht 62.0 in | Wt 111.0 lb

## 2012-10-15 DIAGNOSIS — J4489 Other specified chronic obstructive pulmonary disease: Secondary | ICD-10-CM

## 2012-10-15 DIAGNOSIS — J329 Chronic sinusitis, unspecified: Secondary | ICD-10-CM

## 2012-10-15 DIAGNOSIS — J441 Chronic obstructive pulmonary disease with (acute) exacerbation: Secondary | ICD-10-CM

## 2012-10-15 DIAGNOSIS — J449 Chronic obstructive pulmonary disease, unspecified: Secondary | ICD-10-CM

## 2012-10-15 MED ORDER — SULFAMETHOXAZOLE-TRIMETHOPRIM 400-80 MG PO TABS: 1.0000 | ORAL_TABLET | Freq: Two times a day (BID) | ORAL | Status: AC

## 2012-10-15 MED ORDER — PREDNISONE 10 MG PO TABS: ORAL_TABLET | ORAL | Status: DC

## 2012-10-15 NOTE — Assessment & Plan Note (Addendum)
Gold stage C. COPD with recurrent exacerbations Plan Note chest x-ray and sinus CT scan are negative for chronic infection on today's visit 10/15/2012 Treat with Bactrim double strength one twice daily for 10 days Pulse prednisone continue nebulized therapy

## 2012-10-15 NOTE — Progress Notes (Signed)
Subjective:    Patient ID: Tina [name], female    DOB: 31-Jan-1934, 79 y.o.   MRN: [id number]  HPI  PCP [facility]  79 y.o.   white female with history of chronic obstructive lung disease with asthmatic bronchitis and emphysematous component. Gold Stage C  09/24/2012 Now coughing up thick dark green mucus and white foam, this has been an issue for one week No real chest pain. Notes some tightness.  Notes some pndrip clear.  Wash still gets yellow out of R nares.  No fever chills sweats. At last visit rx 7days of levaquin and pred. This helped the sinuses some.  10/15/2012 At last ov  We : Cipro 500 twice daily for 10days Prednisone 10mg  Take 4 for three days 3 for three days 2 for three days 1 for three days and stop HOLD requip while on cipro Use albuterol in nebulizer twice daily for 7 days in addition to other inhalers  Pt still coughing , mucus is dark green and thick and foamy.  Noted some chest pain .   No blood. Notes ongoing wheezing.  Mucus improved some , nasal spray was hard on sinus   Past Medical History  Diagnosis Date  . Malignant melanoma     no recurrence on leg  . HTN (hypertension)   . COPD (chronic obstructive pulmonary disease)     FeV1 51% 4/09  . Allergic rhinitis   . Degenerative arthritis of foot      Family History  Problem Relation Age of Onset  . Diabetes    . Alzheimer's disease       History   Social History  . Marital Status: Married    Spouse Name: N/A    Number of Children: N/A  . Years of Education: N/A   Occupational History  . Not on file.   Social History Main Topics  . Smoking status: Former Smoker -- 0.30 packs/day for 30 years    Types: Cigarettes    Quit date: 07/29/1982  . Smokeless tobacco: Never Used  . Alcohol Use: Not on file  . Drug Use: Not on file  . Sexually Active: Not on file   Other Topics Concern  . Not on file   Social History Narrative   patient signed designated party release granting  access to [facility] to husband [name]  detailled message may be left on home phone [name]  May 10, 2010 12:06 PM     Allergies  Allergen Reactions  . Doxycycline     REACTION: insomnia  . Penicillins     REACTION: shock     Outpatient Prescriptions Prior to Visit  Medication Sig Dispense Refill  . albuterol (PROAIR HFA) 108 (90 BASE) MCG/ACT inhaler Inhale 2 puffs into the lungs every 6 (six) hours as needed for shortness of breath.  3 Inhaler  3  . albuterol (PROVENTIL) (2.5 MG/3ML) 0.083% nebulizer solution Take 3 mLs (2.5 mg total) by nebulization every 6 (six) hours as needed for wheezing. Dx code 496  75 mL  12  . atorvastatin (LIPITOR) 10 MG tablet Take 10 mg by mouth daily.        . budesonide (PULMICORT) 0.25 MG/2ML nebulizer solution Take 2 mLs (0.25 mg total) by nebulization daily.  60 mL  5  . esomeprazole (NEXIUM) 40 MG capsule Take 40 mg by mouth daily before breakfast.        . ferrous sulfate 325 (65 FE) MG tablet Take 325 mg by  mouth 3 (three) times daily.      . formoterol (PERFOROMIST) 20 MCG/2ML nebulizer solution Take 2 mLs (20 mcg total) by nebulization 2 (two) times daily. Dx code 496  120 mL  5  . furosemide (LASIX) 20 MG tablet Take 1 tablet (20 mg total) by mouth daily as needed.  90 tablet  4  . losartan-hydrochlorothiazide (HYZAAR) 100-25 MG per tablet Take 1 tablet by mouth daily.       . mometasone (NASONEX) 50 MCG/ACT nasal spray Place 2 sprays into the nose daily.  17 g  6  . Respiratory Therapy Supplies (FLUTTER) DEVI Use 3-4 times daily  1 each  0  . tiotropium (SPIRIVA) 18 MCG inhalation capsule Place 1 capsule (18 mcg total) into inhaler and inhale daily.  90 capsule  4  . calcium carbonate (TUMS - DOSED IN MG ELEMENTAL CALCIUM) 500 MG chewable tablet On hold      . lidocaine (LIDODERM) 5 % ON HOLD      . potassium chloride (KLOR-CON) 10 MEQ CR tablet Take 20 mEq by mouth daily.       Marland Kitchen acetaminophen (TYLENOL) 500 MG tablet Take 500 mg by mouth  every 6 (six) hours as needed.      . folic acid-pyridoxine-cyancobalamin (FOLTX) 2.5-25-2 MG TABS Take 1 tablet by mouth daily.        Marland Kitchen oxyCODONE (OXYCONTIN) 10 MG 12 hr tablet Take 10 mg by mouth every 6 (six) hours as needed.      . predniSONE (DELTASONE) 10 MG tablet Take 4 for three days 3 for three days 2 for three days 1 for three days and stop  30 tablet  0  . rOPINIRole (REQUIP) 2 MG tablet HOLD WHILE ON CIPRO       No facility-administered medications prior to visit.     Review of Systems  Constitutional:   No  weight loss, night sweats,  Fevers, chills, fatigue, lassitude. HEENT:   No headaches,  Difficulty swallowing,  Tooth/dental problems,  Sore throat,                No sneezing, itching, ear ache,+++  nasal congestion, +++post nasal drip,   CV:  No chest pain,  Orthopnea, PND, swelling in lower extremities, anasarca, dizziness, palpitations  GI  No heartburn, indigestion, abdominal pain, nausea, vomiting, diarrhea, change in bowel habits, loss of appetite  Resp: +++ shortness of breath with exertion and  at rest.  Notes  excess mucus, notes  productive cough,  No non-productive cough,  No coughing up of blood.  No change in color of mucus.  No wheezing.  No chest wall deformity  Skin: no rash or lesions.  GU: no dysuria, change in color of urine, no urgency or frequency.  No flank pain.  MS:  No joint pain or swelling.  No decreased range of motion.  No back pain.  Psych:  No change in mood or affect. No depression or anxiety.  No memory loss.     Objective:   Physical Exam  Filed Vitals:   10/15/12 1148  BP: 122/62  Pulse: 98  Temp: 98.1 F (36.7 C)  TempSrc: Oral  Height: 5\' 2"  (1.575 m)  Weight: 111 lb (50.349 kg)  SpO2: 99%    Gen: Pleasant, well-nourished, in no distress,  normal affect  ENT: No lesions,  mouth clear,  oropharynx clear, no postnasal drip, left greater than right nasal purulence somewhat improved from prior exam  Neck: No JVD,  no  TMG, no carotid bruits  Lungs: No use of accessory muscles, no dullness to percussion, distant breath sounds with expired wheezes Cardiovascular: RRR, heart sounds normal, no murmur or gallops, no peripheral edema  Abdomen: soft and NT, no HSM,  BS normal  Musculoskeletal: No deformities, no cyanosis or clubbing  Neuro: alert, non focal  Skin: Warm, no lesions or rashes Dg Chest 2 View  10/15/2012  *RADIOLOGY REPORT*  Clinical Data: Chest and sinus congestion.  CHEST - 2 VIEW  Comparison: 06/20/2012.  Findings: The heart, mediastinum and hilar contours are normal. The lungs are slightly hyperexpanded but appear clear.  No effusions or pneumothoraces.  No acute bony changes.  IMPRESSION: No active disease.   Original Report Authenticated By: Sander Radon, M.D.    [facility]  10/15/2012  *RADIOLOGY REPORT*  Clinical Data:  Chronic sinusitis  CT PARANASAL SINUS LIMITED WITHOUT CONTRAST  Technique:  Multidetector CT images of the paranasal sinuses were obtained in a single plane without contrast.  Comparison:   None.  Findings:  Paranasal sinuses are clear.  Negative for mucosal edema or air-fluid level.  No acute bony abnormality.  Nasal septum is deviated anteriorly.  IMPRESSION: Negative for sinusitis.   Original Report Authenticated By: Janeece Riggers, M.D.        Assessment & Plan:   Sinusitis, chronic Chronic rhinitis without evidence of chronic sinusitis on current CT scan Plan Continue nasal hygiene  COPD Golds C Gold stage C. COPD with recurrent exacerbations Plan Note chest x-ray and sinus CT scan are negative for chronic infection on today's visit 10/15/2012 Treat with Bactrim double strength one twice daily for 10 days Pulse prednisone continue nebulized therapy    Updated Medication List Outpatient Encounter Prescriptions as of 10/15/2012  Medication Sig Dispense Refill  . albuterol (PROAIR HFA) 108 (90 BASE) MCG/ACT inhaler Inhale 2 puffs into the  lungs every 6 (six) hours as needed for shortness of breath.  3 Inhaler  3  . albuterol (PROVENTIL) (2.5 MG/3ML) 0.083% nebulizer solution Take 3 mLs (2.5 mg total) by nebulization every 6 (six) hours as needed for wheezing. Dx code 496  75 mL  12  . atorvastatin (LIPITOR) 10 MG tablet Take 10 mg by mouth daily.        . budesonide (PULMICORT) 0.25 MG/2ML nebulizer solution Take 2 mLs (0.25 mg total) by nebulization daily.  60 mL  5  . esomeprazole (NEXIUM) 40 MG capsule Take 40 mg by mouth daily before breakfast.        . ferrous sulfate 325 (65 FE) MG tablet Take 325 mg by mouth 3 (three) times daily.      . formoterol (PERFOROMIST) 20 MCG/2ML nebulizer solution Take 2 mLs (20 mcg total) by nebulization 2 (two) times daily. Dx code 496  120 mL  5  . furosemide (LASIX) 20 MG tablet Take 1 tablet (20 mg total) by mouth daily as needed.  90 tablet  4  . losartan-hydrochlorothiazide (HYZAAR) 100-25 MG per tablet Take 1 tablet by mouth daily.       . mometasone (NASONEX) 50 MCG/ACT nasal spray Place 2 sprays into the nose daily.  17 g  6  . oxyCODONE-acetaminophen (PERCOCET) 10-325 MG per tablet Take 1 tablet by mouth 2 (two) times daily as needed for pain.      Marland Kitchen Respiratory Therapy Supplies (FLUTTER) DEVI Use 3-4 times daily  1 each  0  . rOPINIRole (REQUIP) 2 MG tablet Take 2 mg  by mouth as needed.      . tiotropium (SPIRIVA) 18 MCG inhalation capsule Place 1 capsule (18 mcg total) into inhaler and inhale daily.  90 capsule  4  . calcium carbonate (TUMS - DOSED IN MG ELEMENTAL CALCIUM) 500 MG chewable tablet On hold      . lidocaine (LIDODERM) 5 % ON HOLD      . potassium chloride (KLOR-CON) 10 MEQ CR tablet Take 20 mEq by mouth daily.       . predniSONE (DELTASONE) 10 MG tablet Take 4 for three days 3 for three days 2 for three days  Then one daily  60 tablet  6  . sulfamethoxazole-trimethoprim (BACTRIM) 400-80 MG per tablet Take 1 tablet by mouth 2 (two) times daily.  20 tablet  0  .  [DISCONTINUED] acetaminophen (TYLENOL) 500 MG tablet Take 500 mg by mouth every 6 (six) hours as needed.      . [DISCONTINUED] folic acid-pyridoxine-cyancobalamin (FOLTX) 2.5-25-2 MG TABS Take 1 tablet by mouth daily.        . [DISCONTINUED] oxyCODONE (OXYCONTIN) 10 MG 12 hr tablet Take 10 mg by mouth every 6 (six) hours as needed.      . [DISCONTINUED] predniSONE (DELTASONE) 10 MG tablet Take 4 for three days 3 for three days 2 for three days 1 for three days and stop  30 tablet  0  . [DISCONTINUED] rOPINIRole (REQUIP) 2 MG tablet HOLD WHILE ON CIPRO       No facility-administered encounter medications on file as of 10/15/2012.

## 2012-10-15 NOTE — Patient Instructions (Signed)
CT sinus will be obtained Prednisone 10mg  Take 4 for three days 3 for three days 2 for three days then one daily and STAY Chest xray today No other medication changes May have you see ENT depending on CT Sinus result Stop saline rinse, just use nasonex Return [place] office one month in any case We will cal with xray results

## 2012-10-15 NOTE — Assessment & Plan Note (Signed)
Chronic rhinitis without evidence of chronic sinusitis on current CT scan Plan Continue nasal hygiene

## 2012-10-16 NOTE — Progress Notes (Signed)
Quick Note:  Called, spoke with pt. Informed her of cxr results and recs per Dr. Delford Field. She verbalized understanding of both results and recs and voiced no further questions or concerns at this time.  ______

## 2012-10-16 NOTE — Progress Notes (Signed)
Quick Note:  Called, spoke with pt. Informed her of CT Sinus results and recs per Dr. Delford Field. She verbalized understanding of both results and recs and voiced no further questions or concerns at this time. ______

## 2012-11-05 ENCOUNTER — Ambulatory Visit: Payer: Medicare Other | Admitting: Critical Care Medicine

## 2012-11-19 ENCOUNTER — Encounter: Payer: Self-pay | Admitting: Adult Health

## 2012-11-19 ENCOUNTER — Ambulatory Visit (INDEPENDENT_AMBULATORY_CARE_PROVIDER_SITE_OTHER): Payer: Medicare Other | Admitting: Adult Health

## 2012-11-19 VITALS — BP 110/68 | HR 89 | Temp 96.7°F | Ht 62.0 in | Wt 114.4 lb

## 2012-11-19 DIAGNOSIS — J449 Chronic obstructive pulmonary disease, unspecified: Secondary | ICD-10-CM

## 2012-11-19 MED ORDER — LEVOFLOXACIN 500 MG PO TABS: 500.0000 mg | ORAL_TABLET | Freq: Every day | ORAL | Status: AC

## 2012-11-19 NOTE — Progress Notes (Signed)
Subjective:    Patient ID: Tina [name], female    DOB: April 26, 1934, 79 y.o.   MRN: [id number]  HPI PCP [facility]  79 y.o.   white female with history of chronic obstructive lung disease with asthmatic bronchitis and emphysematous component. Gold Stage C  09/24/2012 Now coughing up thick dark green mucus and white foam, this has been an issue for one week No real chest pain. Notes some tightness.  Notes some pndrip clear.  Wash still gets yellow out of R nares.  No fever chills sweats. At last visit rx 7days of levaquin and pred. This helped the sinuses some.  10/15/2012 At last ov  We : Cipro 500 twice daily for 10days Prednisone 10mg  Take 4 for three days 3 for three days 2 for three days 1 for three days and stop HOLD requip while on cipro Use albuterol in nebulizer twice daily for 7 days in addition to other inhalers  Pt still coughing , mucus is dark green and thick and foamy.  Noted some chest pain .   No blood. Notes ongoing wheezing.  Mucus improved some , nasal spray was hard on sinus >>pred burst    11/19/2012 Acute OV Pt. reports that she has had a sore throat x 4 days and states that she has been coughing up dark green mucus and now has a hoarse voice, pt.  More wheezing for last 2 days . Says she got over recent sinus infection . But husband got sick 1 week ago and now she caught his cold.  She complains of cough, congestion with thick mucus.  No fever, chest pain , edema, or hemoptysis.  Has not been taking her Budesonide /Perforomist lately.  She got confused with her meds.  We went over maintenance  meds vs prn meds  Currently on Prednisone 20mg  daily -to taper to 10mg  soon .  CT sinus and CXR 3/21  neg for acute process   Past Medical History  Diagnosis Date  . Malignant melanoma     no recurrence on leg  . HTN (hypertension)   . COPD (chronic obstructive pulmonary disease)     FeV1 51% 4/09  . Allergic rhinitis   . Degenerative arthritis of foot       Family History  Problem Relation Age of Onset  . Diabetes    . Alzheimer's disease       History   Social History  . Marital Status: Married    Spouse Name: N/A    Number of Children: N/A  . Years of Education: N/A   Occupational History  . Not on file.   Social History Main Topics  . Smoking status: Former Smoker -- 0.30 packs/day for 30 years    Types: Cigarettes    Quit date: 07/29/1982  . Smokeless tobacco: Never Used  . Alcohol Use: Not on file  . Drug Use: Not on file  . Sexually Active: Not on file   Other Topics Concern  . Not on file   Social History Narrative   patient signed designated party release granting access to [facility] to husband [name]  detailled message may be left on home phone [name]  May 10, 2010 12:06 PM     Allergies  Allergen Reactions  . Doxycycline     REACTION: insomnia  . Penicillins     REACTION: shock     Outpatient Prescriptions Prior to Visit  Medication Sig Dispense Refill  . albuterol (PROVENTIL) (2.5 MG/3ML)  0.083% nebulizer solution Take 3 mLs (2.5 mg total) by nebulization every 6 (six) hours as needed for wheezing. Dx code 496  75 mL  12  . atorvastatin (LIPITOR) 10 MG tablet Take 10 mg by mouth daily.        . budesonide (PULMICORT) 0.25 MG/2ML nebulizer solution Take 2 mLs (0.25 mg total) by nebulization daily.  60 mL  5  . calcium carbonate (TUMS - DOSED IN MG ELEMENTAL CALCIUM) 500 MG chewable tablet On hold      . esomeprazole (NEXIUM) 40 MG capsule Take 40 mg by mouth daily before breakfast.        . ferrous sulfate 325 (65 FE) MG tablet Take 325 mg by mouth 3 (three) times daily.      . formoterol (PERFOROMIST) 20 MCG/2ML nebulizer solution Take 2 mLs (20 mcg total) by nebulization 2 (two) times daily. Dx code 496  120 mL  5  . furosemide (LASIX) 20 MG tablet Take 1 tablet (20 mg total) by mouth daily as needed.  90 tablet  4  . lidocaine (LIDODERM) 5 % ON HOLD      . losartan-hydrochlorothiazide (HYZAAR)  100-25 MG per tablet Take 1 tablet by mouth daily.       . mometasone (NASONEX) 50 MCG/ACT nasal spray Place 2 sprays into the nose daily.  17 g  6  . oxyCODONE-acetaminophen (PERCOCET) 10-325 MG per tablet Take 1 tablet by mouth 2 (two) times daily as needed for pain.      . potassium chloride (KLOR-CON) 10 MEQ CR tablet Take 20 mEq by mouth daily.       . predniSONE (DELTASONE) 10 MG tablet Take 4 for three days 3 for three days 2 for three days  Then one daily  60 tablet  6  . Respiratory Therapy Supplies (FLUTTER) DEVI Use 3-4 times daily  1 each  0  . rOPINIRole (REQUIP) 2 MG tablet Take 2 mg by mouth as needed.      . tiotropium (SPIRIVA) 18 MCG inhalation capsule Place 1 capsule (18 mcg total) into inhaler and inhale daily.  90 capsule  4  . albuterol (PROAIR HFA) 108 (90 BASE) MCG/ACT inhaler Inhale 2 puffs into the lungs every 6 (six) hours as needed for shortness of breath.  3 Inhaler  3   No facility-administered medications prior to visit.     Review of Systems  Constitutional:   No  weight loss, night sweats,  Fevers, chills, fatigue, lassitude. HEENT:   No headaches,  Difficulty swallowing,  Tooth/dental problems,  Sore throat,                No sneezing, itching, ear ache,+++  nasal congestion, +++post nasal drip,   CV:  No chest pain,  Orthopnea, PND, swelling in lower extremities, anasarca, dizziness, palpitations  GI  No heartburn, indigestion, abdominal pain, nausea, vomiting, diarrhea, change in bowel habits, loss of appetite  Resp: +++ shortness of breath with exertion and  at rest.  Notes  excess mucus, notes  productive cough,  No non-productive cough,  No coughing up of blood.  No change in color of mucus.  No wheezing.  No chest wall deformity  Skin: no rash or lesions.  GU: no dysuria, change in color of urine, no urgency or frequency.  No flank pain.  MS:  No joint pain or swelling.  No decreased range of motion.  No back pain.  Psych:  No change in mood or  affect.  No depression or anxiety.  No memory loss.     Objective:   Physical Exam  Filed Vitals:   11/19/12 1157  BP: 110/68  Pulse: 89  Temp: 96.7 F (35.9 C)  TempSrc: Oral  Height: 5\' 2"  (1.575 m)  Weight: 114 lb 6.4 oz (51.891 kg)  SpO2: 97%    Gen: Pleasant, well-nourished, in no distress,  normal affect  ENT: No lesions,  mouth clear,  oropharynx clear, no postnasal drip    Neck: No JVD, no TMG, no carotid bruits  Lungs: No use of accessory muscles, coarse rhonchi bilaterally  Cardiovascular: RRR, heart sounds normal, no murmur or gallops, no peripheral edema  Abdomen: soft and NT, no HSM,  BS normal  Musculoskeletal: No deformities, no cyanosis or clubbing  Neuro: alert, non focal  Skin: Warm, no lesions or rashes No results found.     Assessment & Plan:   No problem-specific assessment & plan notes found for this encounter.   Updated Medication List Outpatient Encounter Prescriptions as of 11/19/2012  Medication Sig Dispense Refill  . albuterol (PROVENTIL) (2.5 MG/3ML) 0.083% nebulizer solution Take 3 mLs (2.5 mg total) by nebulization every 6 (six) hours as needed for wheezing. Dx code 496  75 mL  12  . atorvastatin (LIPITOR) 10 MG tablet Take 10 mg by mouth daily.        . budesonide (PULMICORT) 0.25 MG/2ML nebulizer solution Take 2 mLs (0.25 mg total) by nebulization daily.  60 mL  5  . calcium carbonate (TUMS - DOSED IN MG ELEMENTAL CALCIUM) 500 MG chewable tablet On hold      . esomeprazole (NEXIUM) 40 MG capsule Take 40 mg by mouth daily before breakfast.        . ferrous sulfate 325 (65 FE) MG tablet Take 325 mg by mouth 3 (three) times daily.      . formoterol (PERFOROMIST) 20 MCG/2ML nebulizer solution Take 2 mLs (20 mcg total) by nebulization 2 (two) times daily. Dx code 496  120 mL  5  . furosemide (LASIX) 20 MG tablet Take 1 tablet (20 mg total) by mouth daily as needed.  90 tablet  4  . lidocaine (LIDODERM) 5 % ON HOLD      .  losartan-hydrochlorothiazide (HYZAAR) 100-25 MG per tablet Take 1 tablet by mouth daily.       . mometasone (NASONEX) 50 MCG/ACT nasal spray Place 2 sprays into the nose daily.  17 g  6  . oxyCODONE-acetaminophen (PERCOCET) 10-325 MG per tablet Take 1 tablet by mouth 2 (two) times daily as needed for pain.      . potassium chloride (KLOR-CON) 10 MEQ CR tablet Take 20 mEq by mouth daily.       . predniSONE (DELTASONE) 10 MG tablet Take 4 for three days 3 for three days 2 for three days  Then one daily  60 tablet  6  . Respiratory Therapy Supplies (FLUTTER) DEVI Use 3-4 times daily  1 each  0  . rOPINIRole (REQUIP) 2 MG tablet Take 2 mg by mouth as needed.      . tiotropium (SPIRIVA) 18 MCG inhalation capsule Place 1 capsule (18 mcg total) into inhaler and inhale daily.  90 capsule  4  . [DISCONTINUED] albuterol (PROAIR HFA) 108 (90 BASE) MCG/ACT inhaler Inhale 2 puffs into the lungs every 6 (six) hours as needed for shortness of breath.  3 Inhaler  3   No facility-administered encounter medications on file as of 11/19/2012.

## 2012-11-19 NOTE — Patient Instructions (Addendum)
Levaquin 500mg  daily for 7 days  Mucinex DM Twice daily  As needed  Cough/congestion.  Fluids and rest  Taper Prednisone down 10mg  daily .  Please contact office for sooner follow up if symptoms do not improve or worsen or seek emergency care  Follow up Dr. Delford Field  In 3-4 weeks [facility]   and As needed   Hold requip while Levaquin

## 2012-11-19 NOTE — Assessment & Plan Note (Signed)
Exacerbation   Plan  Levaquin 500mg  daily for 7 days  Mucinex DM Twice daily  As needed  Cough/congestion.  Fluids and rest  Taper Prednisone down 10mg  daily .  Please contact office for sooner follow up if symptoms do not improve or worsen or seek emergency care  Follow up Dr. Delford Field  In 3-4 weeks [facility]   and As needed   Hold requip while Levaquin

## 2012-11-20 ENCOUNTER — Telehealth: Payer: Self-pay | Admitting: Critical Care Medicine

## 2012-11-20 DIAGNOSIS — J329 Chronic sinusitis, unspecified: Secondary | ICD-10-CM

## 2012-11-20 NOTE — Telephone Encounter (Signed)
ATC PT LINE BUSY X 4 WCB 

## 2012-11-20 NOTE — Telephone Encounter (Signed)
ATC pt line rang several times < NA, no voicemail. WCB. Carron Curie, CMA

## 2012-11-20 NOTE — Telephone Encounter (Signed)
Duplicate message. See phone note from earlier

## 2012-11-20 NOTE — Telephone Encounter (Signed)
Pt just called back to add that [facility] @ [phone] [facility] [place]. Tina [name]

## 2012-11-23 ENCOUNTER — Ambulatory Visit: Payer: Medicare Other | Admitting: Critical Care Medicine

## 2012-11-23 MED ORDER — PREDNISONE 10 MG PO TABS: 10.0000 mg | ORAL_TABLET | Freq: Every day | ORAL | Status: DC

## 2012-11-23 NOTE — Telephone Encounter (Signed)
Spoke with pt  She states that she is needing her rx for pred called to [facility] [place] I have sent in rx Nothing further needed per pt

## 2012-11-25 ENCOUNTER — Telehealth: Payer: Self-pay | Admitting: Critical Care Medicine

## 2012-11-25 DIAGNOSIS — J449 Chronic obstructive pulmonary disease, unspecified: Secondary | ICD-10-CM

## 2012-11-25 DIAGNOSIS — J4489 Other specified chronic obstructive pulmonary disease: Secondary | ICD-10-CM

## 2012-11-25 MED ORDER — TIOTROPIUM BROMIDE MONOHYDRATE 18 MCG IN CAPS: 18.0000 ug | ORAL_CAPSULE | Freq: Every day | RESPIRATORY_TRACT | Status: DC

## 2012-11-25 NOTE — Telephone Encounter (Signed)
I spoke with pt and she stated [facility] needed [place] to call about her spiriva RX. I called [facility] and only thing needed was pt needs refills. I was transferred to a pharmacists but was on hold for several minutes. I have sent RX electronically.

## 2012-12-14 ENCOUNTER — Ambulatory Visit (INDEPENDENT_AMBULATORY_CARE_PROVIDER_SITE_OTHER): Payer: Medicare Other | Admitting: Critical Care Medicine

## 2012-12-14 ENCOUNTER — Encounter: Payer: Self-pay | Admitting: Critical Care Medicine

## 2012-12-14 VITALS — BP 120/76 | HR 84 | Ht 62.0 in | Wt 115.0 lb

## 2012-12-14 DIAGNOSIS — J449 Chronic obstructive pulmonary disease, unspecified: Secondary | ICD-10-CM

## 2012-12-14 MED ORDER — PREDNISONE 10 MG PO TABS: ORAL_TABLET | ORAL | Status: DC

## 2012-12-14 NOTE — Progress Notes (Signed)
Subjective:    Patient ID: Tina [name], female    DOB: 1933-11-15, 79 y.o.   MRN: [id number]  HPI PCP [facility]  79 y.o.Marland Kitchen   white female with history of chronic obstructive lung disease with asthmatic bronchitis and emphysematous component. Gold Stage C  12/14/2012 Notes eyes blurry.   On pred 10mg  /d.    Dyspnea is better. Min mucus.  No chest pain.   Pt denies any significant sore throat, nasal congestion or excess secretions, fever, chills, sweats, unintended weight loss, pleurtic or exertional chest pain, orthopnea PND, or leg swelling  No oxygen in two weeks Pt denies any increase in rescue therapy over baseline, denies waking up needing it or having any early am or nocturnal exacerbations of coughing/wheezing/or dyspnea. Pt also denies any obvious fluctuation in symptoms with  weather or environmental change or other alleviating or aggravating factors     Past Medical History  Diagnosis Date  . Malignant melanoma     no recurrence on leg  . HTN (hypertension)   . COPD (chronic obstructive pulmonary disease)     FeV1 51% 4/09  . Allergic rhinitis   . Degenerative arthritis of foot      Family History  Problem Relation Age of Onset  . Diabetes    . Alzheimer's disease       History   Social History  . Marital Status: Married    Spouse Name: N/A    Number of Children: N/A  . Years of Education: N/A   Occupational History  . Not on file.   Social History Main Topics  . Smoking status: Former Smoker -- 0.30 packs/day for 30 years    Types: Cigarettes    Quit date: 07/29/1982  . Smokeless tobacco: Never Used  . Alcohol Use: Not on file  . Drug Use: Not on file  . Sexually Active: Not on file   Other Topics Concern  . Not on file   Social History Narrative   patient signed designated party release granting access to [facility] to husband [name]  detailled message may be left on home phone [name]  May 10, 2010 12:06 PM     Allergies   Allergen Reactions  . Doxycycline     REACTION: insomnia  . Penicillins     REACTION: shock     Outpatient Prescriptions Prior to Visit  Medication Sig Dispense Refill  . albuterol (PROVENTIL) (2.5 MG/3ML) 0.083% nebulizer solution Take 3 mLs (2.5 mg total) by nebulization every 6 (six) hours as needed for wheezing. Dx code 496  75 mL  12  . atorvastatin (LIPITOR) 10 MG tablet Take 10 mg by mouth daily.        . budesonide (PULMICORT) 0.25 MG/2ML nebulizer solution Take 2 mLs (0.25 mg total) by nebulization daily.  60 mL  5  . calcium carbonate (TUMS - DOSED IN MG ELEMENTAL CALCIUM) 500 MG chewable tablet as needed.       Marland Kitchen esomeprazole (NEXIUM) 40 MG capsule Take 40 mg by mouth daily before breakfast.        . formoterol (PERFOROMIST) 20 MCG/2ML nebulizer solution Take 2 mLs (20 mcg total) by nebulization 2 (two) times daily. Dx code 496  120 mL  5  . furosemide (LASIX) 20 MG tablet Take 1 tablet (20 mg total) by mouth daily as needed.  90 tablet  4  . lidocaine (LIDODERM) 5 % Place 1 patch onto the skin every 12 (twelve) hours.       Marland Kitchen  losartan-hydrochlorothiazide (HYZAAR) 100-25 MG per tablet Take 1 tablet by mouth daily.       . mometasone (NASONEX) 50 MCG/ACT nasal spray Place 2 sprays into the nose daily.  17 g  6  . oxyCODONE-acetaminophen (PERCOCET) 10-325 MG per tablet Take 1 tablet by mouth every 6 (six) hours as needed for pain.       . potassium chloride (KLOR-CON) 10 MEQ CR tablet Take 10 mEq by mouth 2 (two) times daily.       Marland Kitchen Respiratory Therapy Supplies (FLUTTER) DEVI Use 3-4 times daily  1 each  0  . rOPINIRole (REQUIP) 2 MG tablet Take 1 mg by mouth 2 (two) times daily as needed.       . tiotropium (SPIRIVA) 18 MCG inhalation capsule Place 1 capsule (18 mcg total) into inhaler and inhale daily.  90 capsule  4  . predniSONE (DELTASONE) 10 MG tablet Take 1 tablet (10 mg total) by mouth daily. Take 4 for three days 3 for three days 2 for three days  Then one daily  30  tablet  1  . ferrous sulfate 325 (65 FE) MG tablet Take 325 mg by mouth 3 (three) times daily.       No facility-administered medications prior to visit.     Review of Systems  Constitutional:   No  weight loss, night sweats,  Fevers, chills, fatigue, lassitude. HEENT:   No headaches,  Difficulty swallowing,  Tooth/dental problems,  Sore throat,                No sneezing, itching, ear ache,+++  nasal congestion, +++post nasal drip,   CV:  No chest pain,  Orthopnea, PND, swelling in lower extremities, anasarca, dizziness, palpitations  GI  No heartburn, indigestion, abdominal pain, nausea, vomiting, diarrhea, change in bowel habits, loss of appetite  Resp: + shortness of breath with exertion not   at rest.   No  excess mucus, no  productive cough,  No non-productive cough,  No coughing up of blood.  No change in color of mucus.  No wheezing.  No chest wall deformity  Skin: no rash or lesions.  GU: no dysuria, change in color of urine, no urgency or frequency.  No flank pain.  MS:  No joint pain or swelling.  No decreased range of motion.  No back pain.  Psych:  No change in mood or affect. No depression or anxiety.  No memory loss.     Objective:   Physical Exam  Filed Vitals:   12/14/12 1123  BP: 120/76  Pulse: 84  Height: 5\' 2"  (1.575 m)  Weight: 115 lb (52.164 kg)  SpO2: 100%    Gen: Pleasant, well-nourished, in no distress,  normal affect  ENT: No lesions,  mouth clear,  oropharynx clear, no postnasal drip    Neck: No JVD, no TMG, no carotid bruits  Lungs: No use of accessory muscles, decreased  rhonchi bilaterally  Cardiovascular: RRR, heart sounds normal, no murmur or gallops, no peripheral edema  Abdomen: soft and NT, no HSM,  BS normal  Musculoskeletal: No deformities, no cyanosis or clubbing  Neuro: alert, non focal  Skin: Warm, no lesions or rashes No results found.     Assessment & Plan:   COPD Golds C Gold stage C. COPD with improvement  using chronic oral corticosteroids and resolution of chronic hypoxemia Plan Reduce prednisone to 5 mg daily Maintain medications otherwise as prescribed The patient may reduce oxygen to as needed  Updated Medication List Outpatient Encounter Prescriptions as of 12/14/2012  Medication Sig Dispense Refill  . albuterol (PROVENTIL) (2.5 MG/3ML) 0.083% nebulizer solution Take 3 mLs (2.5 mg total) by nebulization every 6 (six) hours as needed for wheezing. Dx code 496  75 mL  12  . atorvastatin (LIPITOR) 10 MG tablet Take 10 mg by mouth daily.        . baclofen (LIORESAL) 10 MG tablet Take 10 mg by mouth 3 (three) times daily as needed.      . budesonide (PULMICORT) 0.25 MG/2ML nebulizer solution Take 2 mLs (0.25 mg total) by nebulization daily.  60 mL  5  . calcium carbonate (TUMS - DOSED IN MG ELEMENTAL CALCIUM) 500 MG chewable tablet as needed.       Marland Kitchen EPINEPHrine (EPIPEN 2-PAK) 0.3 mg/0.3 mL DEVI Inject into the muscle once.      Marland Kitchen esomeprazole (NEXIUM) 40 MG capsule Take 40 mg by mouth daily before breakfast.        . formoterol (PERFOROMIST) 20 MCG/2ML nebulizer solution Take 2 mLs (20 mcg total) by nebulization 2 (two) times daily. Dx code 496  120 mL  5  . furosemide (LASIX) 20 MG tablet Take 1 tablet (20 mg total) by mouth daily as needed.  90 tablet  4  . lidocaine (LIDODERM) 5 % Place 1 patch onto the skin every 12 (twelve) hours.       Marland Kitchen losartan-hydrochlorothiazide (HYZAAR) 100-25 MG per tablet Take 1 tablet by mouth daily.       . mometasone (NASONEX) 50 MCG/ACT nasal spray Place 2 sprays into the nose daily.  17 g  6  . oxyCODONE-acetaminophen (PERCOCET) 10-325 MG per tablet Take 1 tablet by mouth every 6 (six) hours as needed for pain.       . polyethylene glycol powder (GLYCOLAX/MIRALAX) powder Take 17 g by mouth as needed.      . potassium chloride (KLOR-CON) 10 MEQ CR tablet Take 10 mEq by mouth 2 (two) times daily.       . predniSONE (DELTASONE) 10 MG tablet 1/2 tablet daily       . Prenatal Vit-Fe Sulfate-FA (PRENATAL VITAMIN PO) Take 1 tablet by mouth daily.      Marland Kitchen Respiratory Therapy Supplies (FLUTTER) DEVI Use 3-4 times daily  1 each  0  . rOPINIRole (REQUIP) 2 MG tablet Take 1 mg by mouth 2 (two) times daily as needed.       . tiotropium (SPIRIVA) 18 MCG inhalation capsule Place 1 capsule (18 mcg total) into inhaler and inhale daily.  90 capsule  4  . vitamin B-12 (CYANOCOBALAMIN) 1000 MCG tablet Take 1,000 mcg by mouth daily.      . [DISCONTINUED] predniSONE (DELTASONE) 10 MG tablet Take 1 tablet (10 mg total) by mouth daily. Take 4 for three days 3 for three days 2 for three days  Then one daily  30 tablet  1  . [DISCONTINUED] predniSONE (DELTASONE) 10 MG tablet Take 10 mg by mouth daily.      . [DISCONTINUED] ferrous sulfate 325 (65 FE) MG tablet Take 325 mg by mouth 3 (three) times daily.       No facility-administered encounter medications on file as of 12/14/2012.

## 2012-12-14 NOTE — Assessment & Plan Note (Signed)
Gold stage C. COPD with improvement using chronic oral corticosteroids and resolution of chronic hypoxemia Plan Reduce prednisone to 5 mg daily Maintain medications otherwise as prescribed The patient may reduce oxygen to as needed

## 2012-12-14 NOTE — Patient Instructions (Addendum)
Reduce prednisone to 1/2 daily Ok to use albuterol in inhaler or nebulizer as needed  No other medication changes Return 3 months Use oxygen as needed for now.

## 2012-12-28 ENCOUNTER — Telehealth: Payer: Self-pay | Admitting: Critical Care Medicine

## 2012-12-28 NOTE — Telephone Encounter (Signed)
ERROR

## 2013-03-05 ENCOUNTER — Other Ambulatory Visit: Payer: Self-pay | Admitting: Adult Health

## 2013-03-12 ENCOUNTER — Other Ambulatory Visit: Payer: Self-pay | Admitting: Adult Health

## 2013-03-22 ENCOUNTER — Encounter: Payer: Self-pay | Admitting: Critical Care Medicine

## 2013-03-22 ENCOUNTER — Ambulatory Visit (INDEPENDENT_AMBULATORY_CARE_PROVIDER_SITE_OTHER): Payer: Medicare Other | Admitting: Critical Care Medicine

## 2013-03-22 VITALS — BP 104/62 | HR 88 | Temp 98.0°F | Ht 61.5 in | Wt 121.0 lb

## 2013-03-22 DIAGNOSIS — J449 Chronic obstructive pulmonary disease, unspecified: Secondary | ICD-10-CM

## 2013-03-22 MED ORDER — BUDESONIDE 0.25 MG/2ML IN SUSP: 0.2500 mg | Freq: Every day | RESPIRATORY_TRACT | Status: DC

## 2013-03-22 MED ORDER — FORMOTEROL FUMARATE 20 MCG/2ML IN NEBU: 20.0000 ug | INHALATION_SOLUTION | Freq: Two times a day (BID) | RESPIRATORY_TRACT | Status: DC

## 2013-03-22 MED ORDER — PREDNISONE 5 MG PO TABS: 5.0000 mg | ORAL_TABLET | Freq: Every day | ORAL | Status: DC

## 2013-03-22 MED ORDER — TIOTROPIUM BROMIDE MONOHYDRATE 18 MCG IN CAPS: 18.0000 ug | ORAL_CAPSULE | Freq: Every day | RESPIRATORY_TRACT | Status: DC

## 2013-03-22 NOTE — Progress Notes (Signed)
Subjective:    Patient ID: Tina [name], female    DOB: 04-Jul-1934, 79 y.o.   MRN: [id number]  HPI PCP [facility]  79 y.o.Marland Kitchen   white female with history of chronic obstructive lung disease with asthmatic bronchitis and emphysematous component. Gold Stage C  03/22/2013 Chief Complaint  Patient presents with  . 3 month follow up    Has more trouble breathing on humid days, fatigue, and coughing/sneezing.  Would like to discuss neb meds vs inhalers and prednisone.  No real mucus. Dyspnea is at baseline.  Occ min mucus. Worse if climb a hill.   Pt denies any significant sore throat, nasal congestion or excess secretions, fever, chills, sweats, unintended weight loss, pleurtic or exertional chest pain, orthopnea PND, or leg swelling Pt denies any increase in rescue therapy over baseline, denies waking up needing it or having any early am or nocturnal exacerbations of coughing/wheezing/or dyspnea. Pt also denies any obvious fluctuation in symptoms with  weather or environmental change or other alleviating or aggravating factors  Past Medical History  Diagnosis Date  . Malignant melanoma     no recurrence on leg  . HTN (hypertension)   . COPD (chronic obstructive pulmonary disease)     FeV1 51% 4/09  . Allergic rhinitis   . Degenerative arthritis of foot      Family History  Problem Relation Age of Onset  . Diabetes    . Alzheimer's disease       History   Social History  . Marital Status: Married    Spouse Name: N/A    Number of Children: N/A  . Years of Education: N/A   Occupational History  . Not on file.   Social History Main Topics  . Smoking status: Former Smoker -- 0.30 packs/day for 30 years    Types: Cigarettes    Quit date: 07/29/1982  . Smokeless tobacco: Never Used  . Alcohol Use: Not on file  . Drug Use: Not on file  . Sexual Activity: Not on file   Other Topics Concern  . Not on file   Social History Narrative   patient signed designated  party release granting access to [facility] to husband Tina [name]  detailled message may be left on home phone Tina [name]  May 10, 2010 12:06 PM     Allergies  Allergen Reactions  . Doxycycline     REACTION: insomnia  . Penicillins     REACTION: shock     Outpatient Prescriptions Prior to Visit  Medication Sig Dispense Refill  . albuterol (PROVENTIL) (2.5 MG/3ML) 0.083% nebulizer solution Take 3 mLs (2.5 mg total) by nebulization every 6 (six) hours as needed for wheezing. Dx code 496  75 mL  12  . atorvastatin (LIPITOR) 10 MG tablet Take 10 mg by mouth daily.        . baclofen (LIORESAL) 10 MG tablet Take 10 mg by mouth 3 (three) times daily as needed.      . calcium carbonate (TUMS - DOSED IN MG ELEMENTAL CALCIUM) 500 MG chewable tablet as needed.       Marland Kitchen EPINEPHrine (EPIPEN 2-PAK) 0.3 mg/0.3 mL DEVI Inject into the muscle once.      Marland Kitchen esomeprazole (NEXIUM) 40 MG capsule Take 40 mg by mouth daily before breakfast.        . furosemide (LASIX) 20 MG tablet Take 1 tablet (20 mg total) by mouth daily as needed.  90 tablet  4  . lidocaine (LIDODERM) 5 %  Place 1 patch onto the skin every 12 (twelve) hours.       Marland Kitchen losartan-hydrochlorothiazide (HYZAAR) 100-25 MG per tablet Take 1 tablet by mouth daily.       . mometasone (NASONEX) 50 MCG/ACT nasal spray Place 2 sprays into the nose daily.  17 g  6  . oxyCODONE-acetaminophen (PERCOCET) 10-325 MG per tablet Take 1 tablet by mouth every 6 (six) hours as needed for pain.       . polyethylene glycol powder (GLYCOLAX/MIRALAX) powder Take 17 g by mouth as needed.      . potassium chloride (KLOR-CON) 10 MEQ CR tablet Take 10 mEq by mouth 2 (two) times daily.       . Prenatal Vit-Fe Sulfate-FA (PRENATAL VITAMIN PO) Take 1 tablet by mouth daily.      Marland Kitchen Respiratory Therapy Supplies (FLUTTER) DEVI Use 3-4 times daily  1 each  0  . rOPINIRole (REQUIP) 2 MG tablet Take 1 mg by mouth 2 (two) times daily as needed.       . vitamin B-12 (CYANOCOBALAMIN) 1000  MCG tablet Take 1,000 mcg by mouth daily.      Marland Kitchen tiotropium (SPIRIVA) 18 MCG inhalation capsule Place 1 capsule (18 mcg total) into inhaler and inhale daily.  90 capsule  4  . budesonide (PULMICORT) 0.25 MG/2ML nebulizer solution Take 2 mLs (0.25 mg total) by nebulization daily.  60 mL  5  . formoterol (PERFOROMIST) 20 MCG/2ML nebulizer solution Take 2 mLs (20 mcg total) by nebulization 2 (two) times daily. Dx code 496  120 mL  5  . predniSONE (DELTASONE) 10 MG tablet 1/2 tablet daily       No facility-administered medications prior to visit.     Review of Systems  Constitutional:   No  weight loss, night sweats,  Fevers, chills, fatigue, lassitude. HEENT:   No headaches,  Difficulty swallowing,  Tooth/dental problems,  Sore throat,                No sneezing, itching, ear ache,+++  nasal congestion, +++post nasal drip,   CV:  No chest pain,  Orthopnea, PND, swelling in lower extremities, anasarca, dizziness, palpitations  GI  No heartburn, indigestion, abdominal pain, nausea, vomiting, diarrhea, change in bowel habits, loss of appetite  Resp: + shortness of breath with exertion not   at rest.   No  excess mucus, no  productive cough,  No non-productive cough,  No coughing up of blood.  No change in color of mucus.  No wheezing.  No chest wall deformity  Skin: no rash or lesions.  GU: no dysuria, change in color of urine, no urgency or frequency.  No flank pain.  MS:  No joint pain or swelling.  No decreased range of motion.  No back pain.  Psych:  No change in mood or affect. No depression or anxiety.  No memory loss.     Objective:   Physical Exam  Filed Vitals:   03/22/13 1028  BP: 104/62  Pulse: 88  Temp: 98 F (36.7 C)  TempSrc: Oral  Height: 5' 1.5" (1.562 m)  Weight: 121 lb (54.885 kg)  SpO2: 94%    Gen: Pleasant, well-nourished, in no distress,  normal affect  ENT: No lesions,  mouth clear,  oropharynx clear, no postnasal drip    Neck: No JVD, no TMG, no  carotid bruits  Lungs: No use of accessory muscles, decreased  rhonchi bilaterally  Cardiovascular: RRR, heart sounds normal, no murmur or gallops,  no peripheral edema  Abdomen: soft and NT, no HSM,  BS normal  Musculoskeletal: No deformities, no cyanosis or clubbing  Neuro: alert, non focal  Skin: Warm, no lesions or rashes No results found.     Assessment & Plan:   COPD Golds C Gold stage C. COPD with associated chronic respiratory failure Medication nonadherence with discontinuance of long-acting beta agonist and inhaled steroid along with oral work of steroid The patient was reeducated as to proper use of maintenance medications for the patient's chronic lung disease Plan Resume perforomist  twice daily and budesonide daily by nebulization Resume prednisone 5 mg daily and hold at that dose The patient may use oxygen therapy 2 L each bedtime and as needed during daytime Spiriva is to continue daily    Updated Medication List Outpatient Encounter Prescriptions as of 03/22/2013  Medication Sig Dispense Refill  . albuterol (PROVENTIL) (2.5 MG/3ML) 0.083% nebulizer solution Take 3 mLs (2.5 mg total) by nebulization every 6 (six) hours as needed for wheezing. Dx code 496  75 mL  12  . atorvastatin (LIPITOR) 10 MG tablet Take 10 mg by mouth daily.        . baclofen (LIORESAL) 10 MG tablet Take 10 mg by mouth 3 (three) times daily as needed.      . calcium carbonate (TUMS - DOSED IN MG ELEMENTAL CALCIUM) 500 MG chewable tablet as needed.       Marland Kitchen EPINEPHrine (EPIPEN 2-PAK) 0.3 mg/0.3 mL DEVI Inject into the muscle once.      Marland Kitchen esomeprazole (NEXIUM) 40 MG capsule Take 40 mg by mouth daily before breakfast.        . furosemide (LASIX) 20 MG tablet Take 1 tablet (20 mg total) by mouth daily as needed.  90 tablet  4  . Gabapentin Enacarbil (HORIZANT) 600 MG TB24 Take 1 tablet by mouth daily.      Marland Kitchen lidocaine (LIDODERM) 5 % Place 1 patch onto the skin every 12 (twelve) hours.       Marland Kitchen  losartan-hydrochlorothiazide (HYZAAR) 100-25 MG per tablet Take 1 tablet by mouth daily.       . mometasone (NASONEX) 50 MCG/ACT nasal spray Place 2 sprays into the nose daily.  17 g  6  . oxyCODONE-acetaminophen (PERCOCET) 10-325 MG per tablet Take 1 tablet by mouth every 6 (six) hours as needed for pain.       . polyethylene glycol powder (GLYCOLAX/MIRALAX) powder Take 17 g by mouth as needed.      . potassium chloride (KLOR-CON) 10 MEQ CR tablet Take 10 mEq by mouth 2 (two) times daily.       . Prenatal Vit-Fe Sulfate-FA (PRENATAL VITAMIN PO) Take 1 tablet by mouth daily.      Marland Kitchen Respiratory Therapy Supplies (FLUTTER) DEVI Use 3-4 times daily  1 each  0  . rOPINIRole (REQUIP) 2 MG tablet Take 1 mg by mouth 2 (two) times daily as needed.       . tiotropium (SPIRIVA) 18 MCG inhalation capsule Place 1 capsule (18 mcg total) into inhaler and inhale daily.  90 capsule  4  . vitamin B-12 (CYANOCOBALAMIN) 1000 MCG tablet Take 1,000 mcg by mouth daily.      . [DISCONTINUED] tiotropium (SPIRIVA) 18 MCG inhalation capsule Place 1 capsule (18 mcg total) into inhaler and inhale daily.  90 capsule  4  . budesonide (PULMICORT) 0.25 MG/2ML nebulizer solution Take 2 mLs (0.25 mg total) by nebulization daily. Dx code 496  60 mL  5  . formoterol (PERFOROMIST) 20 MCG/2ML nebulizer solution Take 2 mLs (20 mcg total) by nebulization 2 (two) times daily. Dx code 496  120 mL  5  . predniSONE (DELTASONE) 5 MG tablet Take 1 tablet (5 mg total) by mouth daily.  30 tablet  0  . [DISCONTINUED] budesonide (PULMICORT) 0.25 MG/2ML nebulizer solution Take 2 mLs (0.25 mg total) by nebulization daily.  60 mL  5  . [DISCONTINUED] formoterol (PERFOROMIST) 20 MCG/2ML nebulizer solution Take 2 mLs (20 mcg total) by nebulization 2 (two) times daily. Dx code 496  120 mL  5  . [DISCONTINUED] predniSONE (DELTASONE) 10 MG tablet 1/2 tablet daily      . [DISCONTINUED] predniSONE (DELTASONE) 5 MG tablet Take 1 tablet (5 mg total) by mouth  daily.  90 tablet  3   No facility-administered encounter medications on file as of 03/22/2013.

## 2013-03-22 NOTE — Assessment & Plan Note (Signed)
Gold stage C. COPD with associated chronic respiratory failure Medication nonadherence with discontinuance of long-acting beta agonist and inhaled steroid along with oral work of steroid The patient was reeducated as to proper use of maintenance medications for the patient's chronic lung disease Plan Resume perforomist  twice daily and budesonide daily by nebulization Resume prednisone 5 mg daily and hold at that dose The patient may use oxygen therapy 2 L each bedtime and as needed during daytime Spiriva is to continue daily

## 2013-03-22 NOTE — Patient Instructions (Addendum)
Get back on perforomist twice daily and pulmicort once daily, refills sent to DME company REfills on prednisone sent to local pharmacy and mail order Refill on spiriva sent to mail order  Use oxygen at night 2Liter and as needed day time Return 3 months

## 2013-03-23 ENCOUNTER — Telehealth: Payer: Self-pay | Admitting: Critical Care Medicine

## 2013-03-23 ENCOUNTER — Other Ambulatory Visit: Payer: Self-pay | Admitting: Critical Care Medicine

## 2013-03-23 DIAGNOSIS — J449 Chronic obstructive pulmonary disease, unspecified: Secondary | ICD-10-CM

## 2013-03-23 MED ORDER — ALBUTEROL SULFATE (2.5 MG/3ML) 0.083% IN NEBU: 2.5000 mg | INHALATION_SOLUTION | Freq: Four times a day (QID) | RESPIRATORY_TRACT | Status: DC | PRN

## 2013-03-23 NOTE — Telephone Encounter (Signed)
Spoke with Tina [name]  Pt needing rx for albuterol faxed to [phone] This was done and nothing further needed

## 2013-03-25 ENCOUNTER — Telehealth: Payer: Self-pay | Admitting: Critical Care Medicine

## 2013-03-25 NOTE — Telephone Encounter (Signed)
Called and lmomtcb for Tina [name] to discuss rx for the albuterol that was sent to the pharmacy.

## 2013-03-25 NOTE — Telephone Encounter (Signed)
Will keep the rx the same per PW.   Samples have been left up front for the pt and pt will be by tomorrow to pick these up.

## 2013-03-25 NOTE — Telephone Encounter (Signed)
No the Rx is twice daily on schedule  Offer the pt extra vials Tina [name]

## 2013-03-25 NOTE — Telephone Encounter (Signed)
[name] returned call. [phone] [id number]. Tina [name]

## 2013-03-25 NOTE — Telephone Encounter (Signed)
Called and spoke with [facility] and she stated that the pts insurance will only cover the perforomist 30 vials/30 days.  They wanted to make PW aware so that the directions  Can be changed to once daily as needed.   PW please advise. Thanks  Allergies  Allergen Reactions  . Doxycycline     REACTION: insomnia  . Penicillins     REACTION: shock

## 2013-04-27 ENCOUNTER — Telehealth: Payer: Self-pay | Admitting: Critical Care Medicine

## 2013-04-27 NOTE — Telephone Encounter (Signed)
Spoke with patient advised of recs below per Dr. Delford Field Patient verbalized understanding and nothing further needed at this time

## 2013-04-27 NOTE — Telephone Encounter (Signed)
i would stay on the prednisone for now

## 2013-04-27 NOTE — Telephone Encounter (Signed)
I spoke with the pt and she states she is scheduled to get a steroid injection in her back and her doctor advised her to stop taking oral prednisone. Pt states she is only taking 5mg  daily. Please advise if ok for the pt to stop prednisone. Carron Curie, CMA

## 2013-05-20 ENCOUNTER — Telehealth: Payer: Self-pay | Admitting: Critical Care Medicine

## 2013-05-20 NOTE — Telephone Encounter (Signed)
She should have this done, it is not cosmetic , she will do well

## 2013-05-20 NOTE — Telephone Encounter (Signed)
I spoke with pt. She reports she was suppose to have eye surgery. Her lower lid has dropped and just waters terribly. She was to have this fixed. She did not know the name of the surgery. Her PCP would not allow her to have this done bc he stated it was cosmetic surgery and d/t her health problems he advised no. Pt reports if she does not have this don't then it can cause blindness. Her vision is very impaired and is not corrected by lenses d/t the lower lid retraction. Dr. Otis Dials is the eye doctor. She wants to know if Dr. Delford Field thinks it would be okay to have this done. It does not require her to be put to sleep. I spoke with Dr. Randon Goldsmith office. Was advised they do see a note where pt PCP stated it was cosmetic surgery and advised against this. The nurse reports this is not considered cosmetic surgery and confirmed pt is not put to sleep. Please advise Dr. Delford Field thanks  --pt cell [phone]

## 2013-05-20 NOTE — Telephone Encounter (Signed)
[id number] x1 

## 2013-05-20 NOTE — Telephone Encounter (Signed)
Returning call can be reached at [phone].[name]

## 2013-05-20 NOTE — Telephone Encounter (Signed)
I called Dr. Randon Goldsmith office [phone] to advised them Dr. Delford Field approves this. They close at 5 PM. WCB in the AM. Pt is also aware. I advised her will call them back in the AM.

## 2013-05-21 NOTE — Telephone Encounter (Signed)
Called Dr. Randon Goldsmith office back to speak with Tina [name].  He is aware of PW recs.  Nothing further is needed.

## 2013-05-21 NOTE — Telephone Encounter (Signed)
Called Dr. Randon Goldsmith office.  Spoke with Dayton Martes.  Was advised the Techs are currently on lunch.   She will have one of them call back when they return.

## 2013-05-21 NOTE — Telephone Encounter (Signed)
Tina [name] returning call from Dr. Randon Goldsmith office.

## 2013-05-25 ENCOUNTER — Telehealth: Payer: Self-pay | Admitting: Critical Care Medicine

## 2013-05-25 NOTE — Telephone Encounter (Signed)
noted 

## 2013-05-25 NOTE — Telephone Encounter (Signed)
Looked in Dr. Lynelle Doctor look at and this form is in there. Will forward to Dr. Delford Field so he is aware.

## 2013-05-26 ENCOUNTER — Telehealth: Payer: Self-pay | Admitting: Critical Care Medicine

## 2013-05-26 DIAGNOSIS — J449 Chronic obstructive pulmonary disease, unspecified: Secondary | ICD-10-CM

## 2013-05-26 NOTE — Telephone Encounter (Signed)
Call pt and tell her ONO normal  She no longer needs oxygen day or night

## 2013-05-26 NOTE — Telephone Encounter (Signed)
Surgery Clearance form was completed and signed by PW.  I have faxed this back to Dr. Margaretmary Eddy office. ATC Samantha to inform her of this.  Answering machine picked up to leave a msg if urgent for doc on call. WCB later.

## 2013-05-27 ENCOUNTER — Telehealth: Payer: Self-pay | Admitting: Critical Care Medicine

## 2013-05-27 DIAGNOSIS — J449 Chronic obstructive pulmonary disease, unspecified: Secondary | ICD-10-CM

## 2013-05-27 NOTE — Telephone Encounter (Signed)
Called, spoke with pt.  Informed her of results and recs per Dr. Delford Field.  She verbalized understanding.   Advised order will be placed to have [employer] d/c o2 given results. She verbalized understanding and is in agreement with this plan.

## 2013-05-27 NOTE — Telephone Encounter (Signed)
Called, spoke with Tina [name].  Advised Surgery clearance form has been faxed.  She verbalized understanding of this and has received it.  Nothing further is needed at this time.

## 2013-05-27 NOTE — Telephone Encounter (Signed)
Report said was on RA.  So repeat ono on RA

## 2013-05-27 NOTE — Telephone Encounter (Signed)
I spoke with the pt and she states that she just remembered that she wore her oxygen when she did the ONO. I advised this would change the results and she may need another test. I have placed an order to cancel the d/c order with [employer] at this time until further testing can be done. I advised the pt to continue to use her oxygen as she was until further instructions.  Dr. Delford Field do you want to repeat the ONO on room air? Please advise. Carron Curie, CMA

## 2013-05-27 NOTE — Telephone Encounter (Signed)
Order placed. Tina [name], CMA  

## 2013-06-09 ENCOUNTER — Telehealth: Payer: Self-pay | Admitting: Critical Care Medicine

## 2013-06-09 DIAGNOSIS — J449 Chronic obstructive pulmonary disease, unspecified: Secondary | ICD-10-CM

## 2013-06-09 NOTE — Telephone Encounter (Signed)
ONO shows NORMAL oxygen levels D/c all oxygen

## 2013-06-10 NOTE — Telephone Encounter (Signed)
Called, spoke with pt.  Informed her of below results and recs per Dr. Delford Field.  She verbalized understanding of this and is aware DME will be contacting her to d/c all of her o2.  She voiced no further questions or concerns at this time.  Order placed to d/c all o2.

## 2013-08-24 ENCOUNTER — Encounter: Payer: Self-pay | Admitting: Critical Care Medicine

## 2013-08-24 ENCOUNTER — Ambulatory Visit (INDEPENDENT_AMBULATORY_CARE_PROVIDER_SITE_OTHER): Payer: Medicare Other | Admitting: Critical Care Medicine

## 2013-08-24 VITALS — BP 116/74 | HR 100 | Temp 97.8°F | Ht 61.5 in | Wt 123.0 lb

## 2013-08-24 DIAGNOSIS — J449 Chronic obstructive pulmonary disease, unspecified: Secondary | ICD-10-CM

## 2013-08-24 DIAGNOSIS — Z23 Encounter for immunization: Secondary | ICD-10-CM

## 2013-08-24 MED ORDER — ALBUTEROL SULFATE HFA 108 (90 BASE) MCG/ACT IN AERS: 2.0000 | INHALATION_SPRAY | Freq: Four times a day (QID) | RESPIRATORY_TRACT | Status: DC | PRN

## 2013-08-24 MED ORDER — BUDESONIDE 0.25 MG/2ML IN SUSP: 0.2500 mg | Freq: Every day | RESPIRATORY_TRACT | Status: DC

## 2013-08-24 MED ORDER — TIOTROPIUM BROMIDE MONOHYDRATE 18 MCG IN CAPS: 18.0000 ug | ORAL_CAPSULE | Freq: Every day | RESPIRATORY_TRACT | Status: DC

## 2013-08-24 MED ORDER — FORMOTEROL FUMARATE 20 MCG/2ML IN NEBU: 20.0000 ug | INHALATION_SOLUTION | Freq: Two times a day (BID) | RESPIRATORY_TRACT | Status: DC

## 2013-08-24 MED ORDER — PREDNISONE 5 MG PO TABS: 5.0000 mg | ORAL_TABLET | Freq: Every day | ORAL | Status: DC

## 2013-08-24 MED ORDER — ALBUTEROL SULFATE (2.5 MG/3ML) 0.083% IN NEBU: 2.5000 mg | INHALATION_SOLUTION | Freq: Four times a day (QID) | RESPIRATORY_TRACT | Status: DC | PRN

## 2013-08-24 MED ORDER — MOMETASONE FUROATE 50 MCG/ACT NA SUSP: 2.0000 | Freq: Every day | NASAL | Status: DC

## 2013-08-24 NOTE — Patient Instructions (Addendum)
No change in medications. Return in        4 months You received Prevnar 13 vaccine  3 months supplies of nasonex, proair, spiriva to right source Refills on neb meds(albuterol, perforomist, budesonide ) to DME supplier

## 2013-08-24 NOTE — Progress Notes (Signed)
Subjective:    Patient ID: Tina [name], female    DOB: 1933/10/03, 80 y.o.   MRN: [id number]  HPI PCP [facility]  80 y.o.Marland Kitchen   white female with history of chronic obstructive lung disease with asthmatic bronchitis and emphysematous component. Gold Stage C  08/24/2013 Chief Complaint  Patient presents with  . Follow-up    Pt c/o SOB with exertion, nonprod cough.  Denies any wheezing, congestion.   Did have a flare up after new years.  Rx levaquin. No change in pred. Pt have GI virus and then bronchitis.  No oxygen was needed.  Pt went to ED for IVF.  CT chest was neg for PNA.   Past Medical History  Diagnosis Date  . Malignant melanoma     no recurrence on leg  . HTN (hypertension)   . COPD (chronic obstructive pulmonary disease)     FeV1 51% 4/09  . Allergic rhinitis   . Degenerative arthritis of foot      Family History  Problem Relation Age of Onset  . Diabetes    . Alzheimer's disease       History   Social History  . Marital Status: Married    Spouse Name: N/A    Number of Children: N/A  . Years of Education: N/A   Occupational History  . Not on file.   Social History Main Topics  . Smoking status: Former Smoker -- 0.30 packs/day for 30 years    Types: Cigarettes    Quit date: 07/29/1982  . Smokeless tobacco: Never Used  . Alcohol Use: Not on file  . Drug Use: Not on file  . Sexual Activity: Not on file   Other Topics Concern  . Not on file   Social History Narrative   patient signed designated party release granting access to [employer] to husband Fritz Pickerel  detailled message may be left on home phone Shanon Payor  May 10, 2010 12:06 PM     Allergies  Allergen Reactions  . Doxycycline     REACTION: insomnia  . Penicillins     REACTION: shock     Outpatient Prescriptions Prior to Visit  Medication Sig Dispense Refill  . atorvastatin (LIPITOR) 10 MG tablet Take 10 mg by mouth daily.        . baclofen (LIORESAL) 10 MG tablet Take 10 mg  by mouth 3 (three) times daily as needed.      . calcium carbonate (TUMS - DOSED IN MG ELEMENTAL CALCIUM) 500 MG chewable tablet as needed.       Marland Kitchen EPINEPHrine (EPIPEN 2-PAK) 0.3 mg/0.3 mL DEVI Inject into the muscle once.      Marland Kitchen esomeprazole (NEXIUM) 40 MG capsule Take 40 mg by mouth daily before breakfast.        . furosemide (LASIX) 20 MG tablet Take 1 tablet (20 mg total) by mouth daily as needed.  90 tablet  4  . Gabapentin Enacarbil (HORIZANT) 600 MG TB24 Take 1 tablet by mouth daily.      Marland Kitchen lidocaine (LIDODERM) 5 % Place 1 patch onto the skin every 12 (twelve) hours.       Marland Kitchen losartan-hydrochlorothiazide (HYZAAR) 100-25 MG per tablet Take 1 tablet by mouth daily.       Marland Kitchen oxyCODONE-acetaminophen (PERCOCET) 10-325 MG per tablet Take 1 tablet by mouth every 6 (six) hours as needed for pain.       . polyethylene glycol powder (GLYCOLAX/MIRALAX) powder Take 17 g by mouth as  needed.      . potassium chloride (KLOR-CON) 10 MEQ CR tablet Take 10 mEq by mouth 2 (two) times daily.       . Prenatal Vit-Fe Sulfate-FA (PRENATAL VITAMIN PO) Take 1 tablet by mouth daily.      Marland Kitchen Respiratory Therapy Supplies (FLUTTER) DEVI Use 3-4 times daily  1 each  0  . rOPINIRole (REQUIP) 2 MG tablet Take 1 mg by mouth 2 (two) times daily as needed.       . vitamin B-12 (CYANOCOBALAMIN) 1000 MCG tablet Take 1,000 mcg by mouth daily.      Marland Kitchen albuterol (PROVENTIL) (2.5 MG/3ML) 0.083% nebulizer solution Take 3 mLs (2.5 mg total) by nebulization every 6 (six) hours as needed for wheezing. Dx code 496  120 mL  6  . budesonide (PULMICORT) 0.25 MG/2ML nebulizer solution Take 2 mLs (0.25 mg total) by nebulization daily. Dx code 496  60 mL  5  . formoterol (PERFOROMIST) 20 MCG/2ML nebulizer solution Take 2 mLs (20 mcg total) by nebulization 2 (two) times daily. Dx code 496  120 mL  5  . mometasone (NASONEX) 50 MCG/ACT nasal spray Place 2 sprays into the nose daily.  17 g  6  . predniSONE (DELTASONE) 5 MG tablet Take 1 tablet (5 mg  total) by mouth daily.  30 tablet  0  . tiotropium (SPIRIVA) 18 MCG inhalation capsule Place 1 capsule (18 mcg total) into inhaler and inhale daily.  90 capsule  4   No facility-administered medications prior to visit.     Review of Systems  Constitutional:   No  weight loss, night sweats,  Fevers, chills, fatigue, lassitude. HEENT:   No headaches,  Difficulty swallowing,  Tooth/dental problems,  Sore throat,                No sneezing, itching, ear ache,+  nasal congestion, ++post nasal drip,   CV:  No chest pain,  Orthopnea, PND, swelling in lower extremities, anasarca, dizziness, palpitations  GI  No heartburn, indigestion, abdominal pain, nausea, vomiting, diarrhea, change in bowel habits, loss of appetite  Resp: + shortness of breath with exertion not   at rest.   No  excess mucus, no  productive cough,  No non-productive cough,  No coughing up of blood.  No change in color of mucus.  No wheezing.  No chest wall deformity  Skin: no rash or lesions.  GU: no dysuria, change in color of urine, no urgency or frequency.  No flank pain.  MS:  No joint pain or swelling.  No decreased range of motion.  No back pain.  Psych:  No change in mood or affect. No depression or anxiety.  No memory loss.     Objective:   Physical Exam  Filed Vitals:   08/24/13 1018  BP: 116/74  Pulse: 100  Temp: 97.8 F (36.6 C)  TempSrc: Oral  Height: 5' 1.5" (1.562 m)  Weight: 123 lb (55.792 kg)  SpO2: 99%    Gen: Pleasant, well-nourished, in no distress,  normal affect  ENT: No lesions,  mouth clear,  oropharynx clear, no postnasal drip    Neck: No JVD, no TMG, no carotid bruits  Lungs: No use of accessory muscles, decreased  rhonchi bilaterally  Cardiovascular: RRR, heart sounds normal, no murmur or gallops, no peripheral edema  Abdomen: soft and NT, no HSM,  BS normal  Musculoskeletal: No deformities, no cyanosis or clubbing  Neuro: alert, non focal  Skin: Warm, no lesions  or  rashes No results found.     Assessment & Plan:   COPD Golds C Gold stage C. COPD with recent exacerbation, improved at this time Plan  Prevnar 13 vaccine was given Maintain inhaled medications as prescribed    Updated Medication List Outpatient Encounter Prescriptions as of 08/24/2013  Medication Sig  . albuterol (PROVENTIL) (2.5 MG/3ML) 0.083% nebulizer solution Take 3 mLs (2.5 mg total) by nebulization every 6 (six) hours as needed for wheezing. Dx code 496  . atorvastatin (LIPITOR) 10 MG tablet Take 10 mg by mouth daily.    . baclofen (LIORESAL) 10 MG tablet Take 10 mg by mouth 3 (three) times daily as needed.  . budesonide (PULMICORT) 0.25 MG/2ML nebulizer solution Take 2 mLs (0.25 mg total) by nebulization daily. Dx code 14  . calcium carbonate (TUMS - DOSED IN MG ELEMENTAL CALCIUM) 500 MG chewable tablet as needed.   Marland Kitchen EPINEPHrine (EPIPEN 2-PAK) 0.3 mg/0.3 mL DEVI Inject into the muscle once.  Marland Kitchen esomeprazole (NEXIUM) 40 MG capsule Take 40 mg by mouth daily before breakfast.    . formoterol (PERFOROMIST) 20 MCG/2ML nebulizer solution Take 2 mLs (20 mcg total) by nebulization 2 (two) times daily. Dx code 496  . furosemide (LASIX) 20 MG tablet Take 1 tablet (20 mg total) by mouth daily as needed.  . Gabapentin Enacarbil (HORIZANT) 600 MG TB24 Take 1 tablet by mouth daily.  Marland Kitchen lidocaine (LIDODERM) 5 % Place 1 patch onto the skin every 12 (twelve) hours.   Marland Kitchen losartan-hydrochlorothiazide (HYZAAR) 100-25 MG per tablet Take 1 tablet by mouth daily.   . mometasone (NASONEX) 50 MCG/ACT nasal spray Place 2 sprays into the nose daily.  Marland Kitchen oxyCODONE-acetaminophen (PERCOCET) 10-325 MG per tablet Take 1 tablet by mouth every 6 (six) hours as needed for pain.   . polyethylene glycol powder (GLYCOLAX/MIRALAX) powder Take 17 g by mouth as needed.  . potassium chloride (KLOR-CON) 10 MEQ CR tablet Take 10 mEq by mouth 2 (two) times daily.   . predniSONE (DELTASONE) 5 MG tablet Take 1 tablet (5 mg  total) by mouth daily.  . Prenatal Vit-Fe Sulfate-FA (PRENATAL VITAMIN PO) Take 1 tablet by mouth daily.  Marland Kitchen Respiratory Therapy Supplies (FLUTTER) DEVI Use 3-4 times daily  . rOPINIRole (REQUIP) 2 MG tablet Take 1 mg by mouth 2 (two) times daily as needed.   . tiotropium (SPIRIVA) 18 MCG inhalation capsule Place 1 capsule (18 mcg total) into inhaler and inhale daily.  . vitamin B-12 (CYANOCOBALAMIN) 1000 MCG tablet Take 1,000 mcg by mouth daily.  . [DISCONTINUED] albuterol (PROVENTIL) (2.5 MG/3ML) 0.083% nebulizer solution Take 3 mLs (2.5 mg total) by nebulization every 6 (six) hours as needed for wheezing. Dx code 54  . [DISCONTINUED] budesonide (PULMICORT) 0.25 MG/2ML nebulizer solution Take 2 mLs (0.25 mg total) by nebulization daily. Dx code 47  . [DISCONTINUED] formoterol (PERFOROMIST) 20 MCG/2ML nebulizer solution Take 2 mLs (20 mcg total) by nebulization 2 (two) times daily. Dx code 73  . [DISCONTINUED] mometasone (NASONEX) 50 MCG/ACT nasal spray Place 2 sprays into the nose daily.  . [DISCONTINUED] predniSONE (DELTASONE) 5 MG tablet Take 1 tablet (5 mg total) by mouth daily.  . [DISCONTINUED] tiotropium (SPIRIVA) 18 MCG inhalation capsule Place 1 capsule (18 mcg total) into inhaler and inhale daily.  Marland Kitchen albuterol (PROVENTIL HFA;VENTOLIN HFA) 108 (90 BASE) MCG/ACT inhaler Inhale 2 puffs into the lungs every 6 (six) hours as needed for wheezing or shortness of breath.

## 2013-08-25 NOTE — Assessment & Plan Note (Addendum)
Gold stage C. COPD with recent exacerbation, improved at this time Plan  Prevnar 13 vaccine was given Maintain inhaled medications as prescribed

## 2013-08-27 ENCOUNTER — Ambulatory Visit: Payer: Medicare Other | Admitting: Internal Medicine

## 2013-10-21 ENCOUNTER — Other Ambulatory Visit: Payer: Self-pay | Admitting: *Deleted

## 2013-10-21 DIAGNOSIS — J449 Chronic obstructive pulmonary disease, unspecified: Secondary | ICD-10-CM

## 2013-10-21 MED ORDER — TIOTROPIUM BROMIDE MONOHYDRATE 18 MCG IN CAPS: 18.0000 ug | ORAL_CAPSULE | Freq: Every day | RESPIRATORY_TRACT | Status: DC

## 2014-03-24 ENCOUNTER — Ambulatory Visit (INDEPENDENT_AMBULATORY_CARE_PROVIDER_SITE_OTHER): Payer: Medicare Other | Admitting: Critical Care Medicine

## 2014-03-24 ENCOUNTER — Ambulatory Visit (HOSPITAL_BASED_OUTPATIENT_CLINIC_OR_DEPARTMENT_OTHER)
Admission: RE | Admit: 2014-03-24 | Discharge: 2014-03-24 | Disposition: A | Discharge status: Home / Self Care | Payer: Medicare Other | Source: Ambulatory Visit | Attending: Critical Care Medicine | Admitting: Critical Care Medicine

## 2014-03-24 ENCOUNTER — Encounter: Payer: Self-pay | Admitting: Critical Care Medicine

## 2014-03-24 VITALS — BP 120/56 | HR 94 | Temp 98.0°F | Ht 62.0 in | Wt 126.0 lb

## 2014-03-24 DIAGNOSIS — R05 Cough: Secondary | ICD-10-CM | POA: Diagnosis present

## 2014-03-24 DIAGNOSIS — J4489 Other specified chronic obstructive pulmonary disease: Secondary | ICD-10-CM | POA: Insufficient documentation

## 2014-03-24 DIAGNOSIS — J209 Acute bronchitis, unspecified: Secondary | ICD-10-CM

## 2014-03-24 DIAGNOSIS — R059 Cough, unspecified: Secondary | ICD-10-CM | POA: Diagnosis present

## 2014-03-24 DIAGNOSIS — J449 Chronic obstructive pulmonary disease, unspecified: Secondary | ICD-10-CM

## 2014-03-24 MED ORDER — PREDNISONE 5 MG PO TABS: ORAL_TABLET | ORAL | Status: DC

## 2014-03-24 MED ORDER — HYDROCODONE-HOMATROPINE 5-1.5 MG/5ML PO SYRP: 5.0000 mL | ORAL_SOLUTION | Freq: Four times a day (QID) | ORAL | Status: DC | PRN

## 2014-03-24 MED ORDER — PREDNISONE 10 MG PO TABS: ORAL_TABLET | ORAL | Status: DC

## 2014-03-24 MED ORDER — MOXIFLOXACIN HCL 400 MG PO TABS: 400.0000 mg | ORAL_TABLET | Freq: Every day | ORAL | Status: DC

## 2014-03-24 MED ORDER — ALBUTEROL SULFATE HFA 108 (90 BASE) MCG/ACT IN AERS: 2.0000 | INHALATION_SPRAY | Freq: Four times a day (QID) | RESPIRATORY_TRACT | Status: DC | PRN

## 2014-03-24 MED ORDER — TIOTROPIUM BROMIDE MONOHYDRATE 18 MCG IN CAPS: 18.0000 ug | ORAL_CAPSULE | Freq: Every day | RESPIRATORY_TRACT | Status: AC

## 2014-03-24 MED ORDER — MOMETASONE FUROATE 50 MCG/ACT NA SUSP: 2.0000 | Freq: Every day | NASAL | Status: AC

## 2014-03-24 NOTE — Progress Notes (Signed)
Quick Note:  Notify the patient that the Xray is stable and no pneumonia No change in medications are recommended. Continue current meds as prescribed at last office visit ______ 

## 2014-03-24 NOTE — Patient Instructions (Signed)
Prednisone 10mg  Take 4 for three days 3 for three days 2 for three days 1 for three days and then resume 5mg  prednisone Avelox one daily for 7days Above sent to [facility] Chest xray today  Use hycodan as needed for cough Return 2 months

## 2014-03-24 NOTE — Progress Notes (Signed)
Subjective:    Patient ID: Tina [name], female    DOB: [birth year], 80 y.o.   MRN: [id number]  HPI 03/24/2014 Chief Complaint  Patient presents with  . Follow-up    was seen by PCP x 1 wk ago for URI.  Was given prednisone and abx.  Finished abx and pred yesterday.  Cough only slightly improved.  Cough is deep and prod with white mucus.  Has increased SOB from baseline.  No chest tightness/pain or f/c/s.   Pt ill one week ago and Rx Zpak and tessalon.  Prednisone also given x 5 days.  No improved. Coughing in the throat.  Mucus is clear to green gray. No fever.  No real chest pain. Notes mild wheezing. Notes mucus in throat, mild dysphagia.  Notes mild nasal drip.    Review of Systems Constitutional:   No  weight loss, night sweats,  Fevers, chills, fatigue, lassitude. HEENT:   No headaches,  Difficulty swallowing,  Tooth/dental problems,  Sore throat,                No sneezing, itching, ear ache, nasal congestion, post nasal drip,   CV:  No chest pain,  Orthopnea, PND, swelling in lower extremities, anasarca, dizziness, palpitations  GI  No heartburn, indigestion, abdominal pain, nausea, vomiting, diarrhea, change in bowel habits, loss of appetite  Resp: Notes shortness of breath with exertion not at rest.  No excess mucus, notes  productive cough,  No non-productive cough,  No coughing up of blood.  No change in color of mucus.  No wheezing.  No chest wall deformity  Skin: no rash or lesions.  GU: no dysuria, change in color of urine, no urgency or frequency.  No flank pain.  MS:  No joint pain or swelling.  No decreased range of motion.  No back pain.  Psych:  No change in mood or affect. No depression or anxiety.  No memory loss.     Objective:   Physical Exam Filed Vitals:   03/24/14 1223  BP: 120/56  Pulse: 94  Temp: 98 F (36.7 C)  TempSrc: Oral  Height: 5\' 2"  (1.575 m)  Weight: 126 lb (57.153 kg)  SpO2: 96%    Gen: Pleasant, well-nourished, in no distress,   normal affect  ENT: No lesions,  mouth clear,  oropharynx clear, no postnasal drip  Neck: No JVD, no TMG, no carotid bruits  Lungs: No use of accessory muscles, no dullness to percussion, expired wheezes  Cardiovascular: RRR, heart sounds normal, no murmur or gallops, no peripheral edema  Abdomen: soft and NT, no HSM,  BS normal  Musculoskeletal: No deformities, no cyanosis or clubbing  Neuro: alert, non focal  Skin: Warm, no lesions or rashes  Dg Chest 2 View  03/24/2014   CLINICAL DATA:  Cough and shortness of breath.  ; history of COPD  EXAM: CHEST  2 VIEW  COMPARISON:  PA and lateral chest x-ray of October 15, 2012  FINDINGS: The lungs are mildly hyperinflated. There is no focal infiltrate. The heart and pulmonary vascularity appear normal. There is no pleural effusion. The mediastinum is normal in width. There is faint calcification associated with the left breast implant capsule.  IMPRESSION: COPD without evidence of pneumonia nor CHF. One cannot exclude acute bronchitis in the appropriate clinical setting.   Electronically Signed   By: David  Martinique   On: 03/24/2014 14:18          Assessment & Plan:  COPD Golds C COPD with mild exacerbation Note chest x-ray showed no active disease Plan Prednisone 10mg  Take 4 for three days 3 for three days 2 for three days 1 for three days and then resume 5mg  prednisone Avelox one daily for 7days Above sent to [facility] Chest xray today  Use hycodan as needed for cough Return 2 months    Updated Medication List Outpatient Encounter Prescriptions as of 03/24/2014  Medication Sig  . albuterol (PROVENTIL HFA;VENTOLIN HFA) 108 (90 BASE) MCG/ACT inhaler Inhale 2 puffs into the lungs every 6 (six) hours as needed for wheezing or shortness of breath.  Marland Kitchen albuterol (PROVENTIL) (2.5 MG/3ML) 0.083% nebulizer solution Take 3 mLs (2.5 mg total) by nebulization every 6 (six) hours as needed for wheezing. Dx code 65  .  aspirin 325 MG tablet Take 325 mg by mouth daily.  Marland Kitchen atorvastatin (LIPITOR) 10 MG tablet Take 10 mg by mouth daily.    . budesonide (PULMICORT) 0.25 MG/2ML nebulizer solution Take 2 mLs (0.25 mg total) by nebulization daily. Dx code 42  . calcium carbonate (TUMS - DOSED IN MG ELEMENTAL CALCIUM) 500 MG chewable tablet Chew 1 tablet by mouth daily.   . cycloSPORINE (RESTASIS) 0.05 % ophthalmic emulsion Place 1 drop into both eyes 2 (two) times daily.  Marland Kitchen EPINEPHrine (EPIPEN 2-PAK) 0.3 mg/0.3 mL DEVI Inject into the muscle once.  Marland Kitchen esomeprazole (NEXIUM) 40 MG capsule Take 40 mg by mouth daily before breakfast.    . formoterol (PERFOROMIST) 20 MCG/2ML nebulizer solution Take 2 mLs (20 mcg total) by nebulization 2 (two) times daily. Dx code 496  . furosemide (LASIX) 20 MG tablet Take 1 tablet (20 mg total) by mouth daily as needed.  Marland Kitchen guaiFENesin (MUCINEX) 600 MG 12 hr tablet Take 600 mg by mouth at bedtime.  . lidocaine (LIDODERM) 5 % Place 1 patch onto the skin every 12 (twelve) hours as needed.   Marland Kitchen losartan-hydrochlorothiazide (HYZAAR) 100-25 MG per tablet Take 1 tablet by mouth daily.   . mometasone (NASONEX) 50 MCG/ACT nasal spray Place 2 sprays into the nose daily.  . polyethylene glycol powder (GLYCOLAX/MIRALAX) powder Take 17 g by mouth as needed.  . potassium chloride (KLOR-CON) 10 MEQ CR tablet Take 10 mEq by mouth 2 (two) times daily.   . predniSONE (DELTASONE) 5 MG tablet HOLD while on 10mg  prednisone then resume 5mg  prednisone  . Prenatal Vit-Fe Sulfate-FA (PRENATAL VITAMIN PO) Take 1 tablet by mouth daily.  Marland Kitchen Respiratory Therapy Supplies (FLUTTER) DEVI Use 3-4 times daily  . rOPINIRole (REQUIP) 2 MG tablet Take 1 mg by mouth 2 (two) times daily as needed.   . tiotropium (SPIRIVA) 18 MCG inhalation capsule Place 1 capsule (18 mcg total) into inhaler and inhale daily.  . vitamin B-12 (CYANOCOBALAMIN) 1000 MCG tablet Take 1,000 mcg by mouth daily.  . [DISCONTINUED] albuterol (PROVENTIL  HFA;VENTOLIN HFA) 108 (90 BASE) MCG/ACT inhaler Inhale 2 puffs into the lungs every 6 (six) hours as needed for wheezing or shortness of breath.  . [DISCONTINUED] mometasone (NASONEX) 50 MCG/ACT nasal spray Place 2 sprays into the nose daily.  . [DISCONTINUED] mometasone (NASONEX) 50 MCG/ACT nasal spray Place 2 sprays into the nose daily as needed.  . [DISCONTINUED] predniSONE (DELTASONE) 5 MG tablet Take 1 tablet (5 mg total) by mouth daily.  . [DISCONTINUED] tiotropium (SPIRIVA) 18 MCG inhalation capsule Place 1 capsule (18 mcg total) into inhaler and inhale daily.  Marland Kitchen HYDROcodone-homatropine (HYCODAN) 5-1.5 MG/5ML syrup Take 5 mLs by  mouth every 6 (six) hours as needed for cough.  . moxifloxacin (AVELOX) 400 MG tablet Take 1 tablet (400 mg total) by mouth daily.  . predniSONE (DELTASONE) 10 MG tablet Take 4 for three days 3 for three days 2 for three days 1 for three days and stop  . [DISCONTINUED] baclofen (LIORESAL) 10 MG tablet Take 10 mg by mouth 3 (three) times daily as needed.  . [DISCONTINUED] Gabapentin Enacarbil (HORIZANT) 600 MG TB24 Take 1 tablet by mouth daily.  . [DISCONTINUED] oxyCODONE-acetaminophen (PERCOCET) 10-325 MG per tablet Take 1 tablet by mouth every 6 (six) hours as needed for pain.

## 2014-03-25 NOTE — Assessment & Plan Note (Signed)
COPD with mild exacerbation Note chest x-ray showed no active disease Plan Prednisone 10mg  Take 4 for three days 3 for three days 2 for three days 1 for three days and then resume 5mg  prednisone Avelox one daily for 7days Above sent to [facility] Chest xray today  Use hycodan as needed for cough Return 2 months

## 2014-03-28 NOTE — Progress Notes (Signed)
Quick Note:  lmomtcb on pt's home and cell #s ______ 

## 2014-03-29 ENCOUNTER — Telehealth: Payer: Self-pay | Admitting: *Deleted

## 2014-03-29 MED ORDER — HYDROCODONE-HOMATROPINE 5-1.5 MG/5ML PO SYRP: 5.0000 mL | ORAL_SOLUTION | Freq: Four times a day (QID) | ORAL | Status: DC | PRN

## 2014-03-29 NOTE — Telephone Encounter (Signed)
Spoke with pt regarding cxr results and recs per Dr. Joya Gaskins.  She verbalized understanding.   Hycodan rx was given during OV on 03/24/14 #120 x 0. Pt states she has been taking 5 mL approx tid and ran out of medication last night. She is requesting rx to be mailed to verified home address.   Dr. Joya Gaskins, pls advise if rx is ok.   Thank you.

## 2014-03-29 NOTE — Telephone Encounter (Signed)
Message copied by Adalberto Ill on Tue Mar 29, 2014  8:14 AM ------      Message from: Asencion Noble E      Created: Thu Mar 24, 2014  5:41 PM       Notify the patient that the Xray is stable and no pneumonia      No change in medications are recommended.      Continue current meds as prescribed at last office visit ------

## 2014-03-29 NOTE — Telephone Encounter (Signed)
i am ok with refill x 1

## 2014-03-29 NOTE — Progress Notes (Signed)
Quick Note:  Called, spoke with pt. Informed her of cxr results and recs per Dr. Joya Gaskins. She verbalized understanding. ______

## 2014-03-29 NOTE — Telephone Encounter (Signed)
Rx printed, signed by Dr. Joya Gaskins, and placed in mail to pt's verified home address.  Pt aware.

## 2014-03-29 NOTE — Telephone Encounter (Signed)
As PW is in [place], [employer] can you print RX and have him sign it? Thanks

## 2014-04-28 IMAGING — CT CT PARANASAL SINUSES LIMITED
1 series · 10 of 12 positions shown, 13 images · non-contrast
Comparison: None.

CLINICAL DATA: Chronic sinusitis

CT PARANASAL SINUS LIMITED WITHOUT CONTRAST
TECHNIQUE: Multidetector CT images of the paranasal sinuses were
obtained in a single plane without contrast.

[Series 3: sinus supine 5.0 h31s · axial · 0.27mm/px · z∈[-197,-107]mm · 10 of 12 slices shown, 13 images]
[im 2/12  brain]
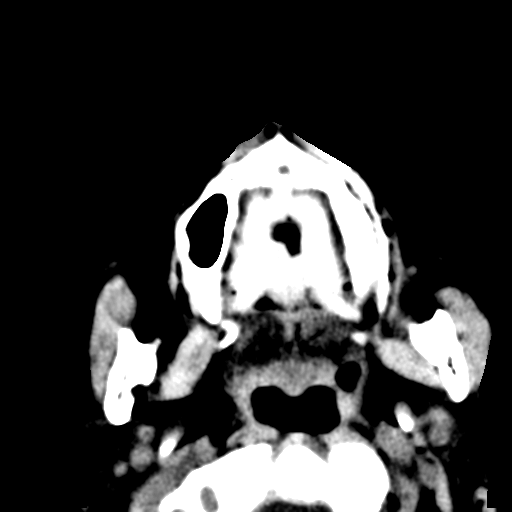
[im 2/12  bone]
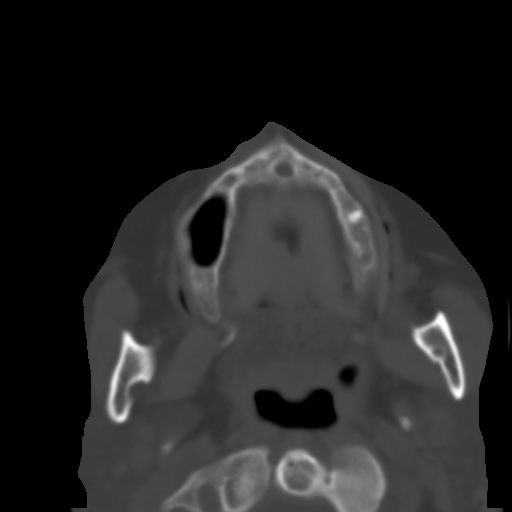
[im 3/12  bone]
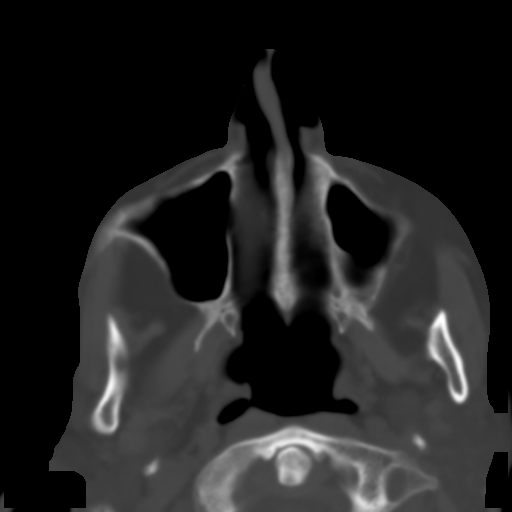
[im 4/12  bone]
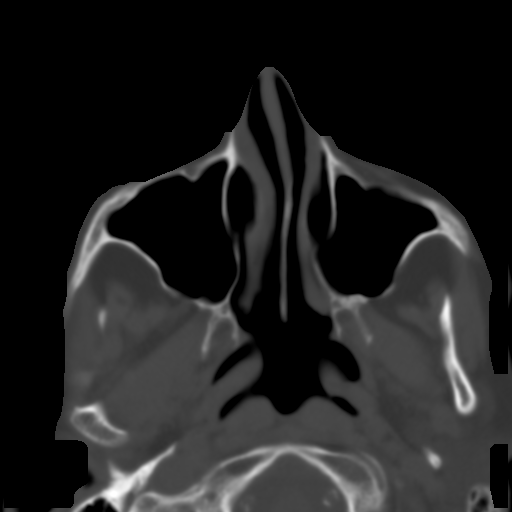
[im 5/12  bone]
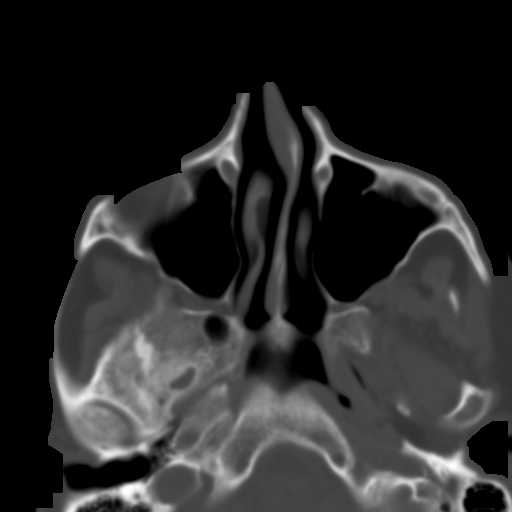
[im 6/12  brain]
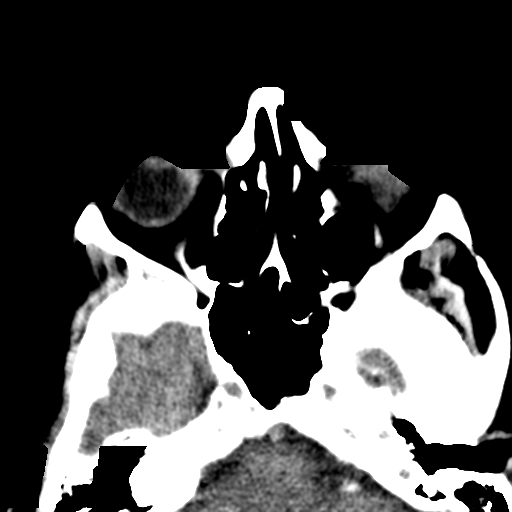
[im 6/12  bone]
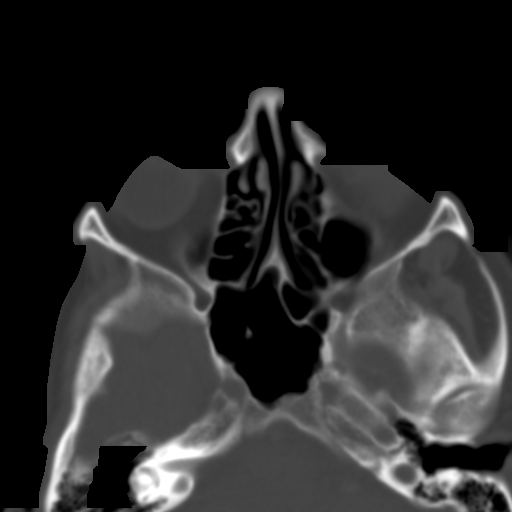
[im 7/12  bone]
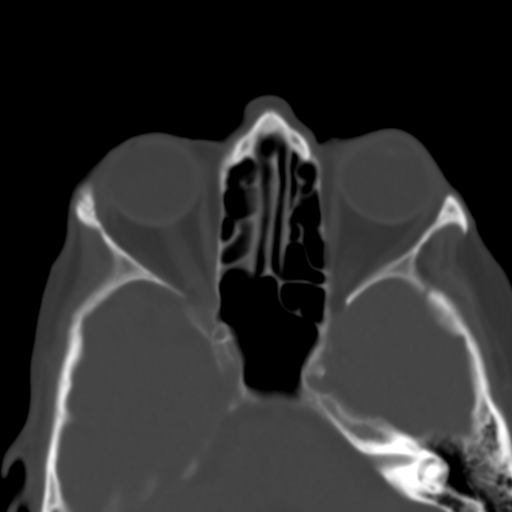
[im 8/12  bone]
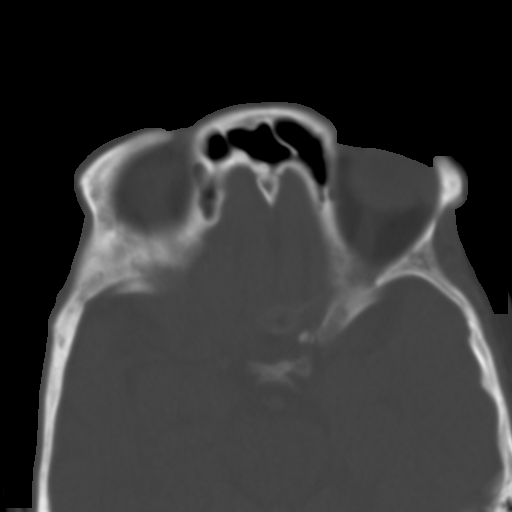
[im 9/12  bone]
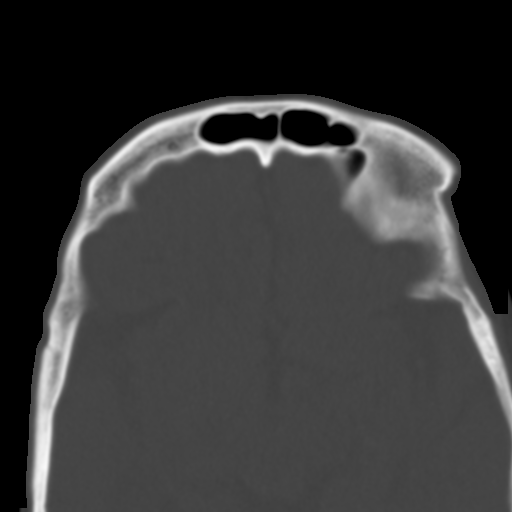
[im 10/12  brain]
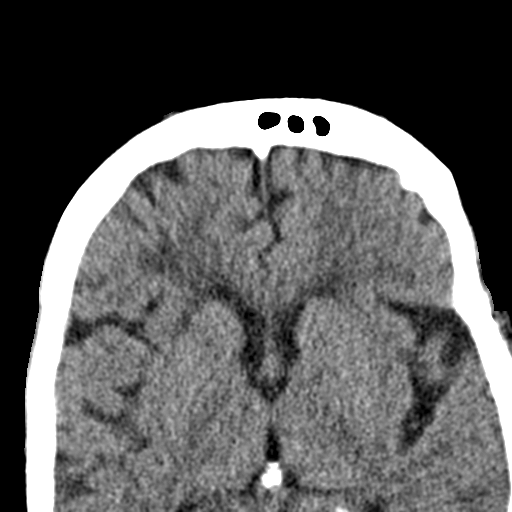
[im 10/12  bone]
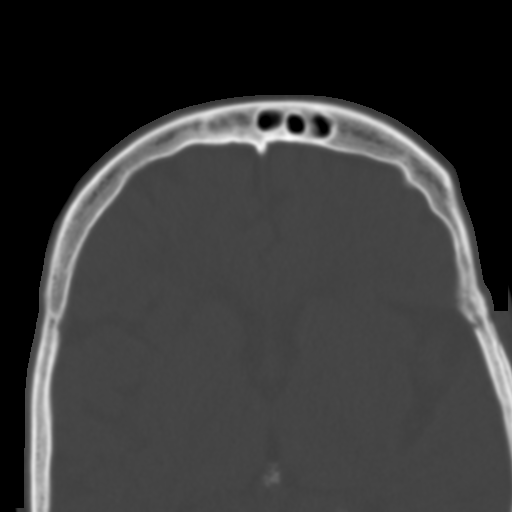
[im 11/12  bone]
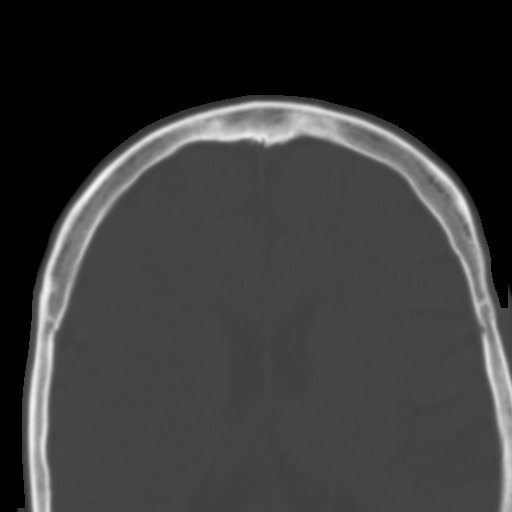

[10 of 12 positions shown; findings below may reference images not displayed]

FINDINGS: Paranasal sinuses are clear.  Negative for mucosal edema
or air-fluid level.  No acute bony abnormality.  Nasal septum is
deviated anteriorly.
IMPRESSION: Negative for sinusitis.

## 2014-05-05 ENCOUNTER — Ambulatory Visit (INDEPENDENT_AMBULATORY_CARE_PROVIDER_SITE_OTHER): Payer: Medicare Other | Admitting: Critical Care Medicine

## 2014-05-05 ENCOUNTER — Encounter: Payer: Self-pay | Admitting: Critical Care Medicine

## 2014-05-05 VITALS — BP 124/70 | HR 95 | Ht 62.0 in | Wt 129.0 lb

## 2014-05-05 DIAGNOSIS — J441 Chronic obstructive pulmonary disease with (acute) exacerbation: Secondary | ICD-10-CM

## 2014-05-05 DIAGNOSIS — Z23 Encounter for immunization: Secondary | ICD-10-CM

## 2014-05-05 DIAGNOSIS — J440 Chronic obstructive pulmonary disease with acute lower respiratory infection: Principal | ICD-10-CM

## 2014-05-05 DIAGNOSIS — J209 Acute bronchitis, unspecified: Secondary | ICD-10-CM

## 2014-05-05 MED ORDER — PREDNISONE 10 MG PO TABS: ORAL_TABLET | ORAL | Status: DC

## 2014-05-05 MED ORDER — ROPINIROLE HCL 2 MG PO TABS: 2.0000 mg | ORAL_TABLET | Freq: Every day | ORAL | Status: DC

## 2014-05-05 MED ORDER — AZITHROMYCIN 250 MG PO TABS: ORAL_TABLET | ORAL | Status: DC

## 2014-05-05 NOTE — Progress Notes (Signed)
Subjective:    Patient ID: Tina [name], female    DOB: 25-Mar-1934, 80 y.o.   MRN: [id number]  HPI  05/05/2014 Chief Complaint  Patient presents with  . Follow-up    Pt  c/o worsening "labored" breathing throughout the day.  Wheezing with exertion, PND, sinus congestion.    Pt notes more dyspnea. Min mucus is noted, if coughs comes up and is gray.  No real chest pain, just tight.  No f/c/s.  No hemoptysis.  Nasal congestion, No sinus pressure, just headache.   Review of Systems  Constitutional:   No  weight loss, night sweats,  Fevers, chills, fatigue, lassitude. HEENT:   No headaches,  Difficulty swallowing,  Tooth/dental problems,  Sore throat,                No sneezing, itching, ear ache, ++nasal congestion, ++ post nasal drip,   CV:  No chest pain,  Orthopnea, PND, swelling in lower extremities, anasarca, dizziness, palpitations  GI  No heartburn, indigestion, abdominal pain, nausea, vomiting, diarrhea, change in bowel habits, loss of appetite  Resp: Notes shortness of breath with exertion not at rest.  No excess mucus, notes  productive cough,  No non-productive cough,  No coughing up of blood.  No change in color of mucus.  No wheezing.  No chest wall deformity  Skin: no rash or lesions.  GU: no dysuria, change in color of urine, no urgency or frequency.  No flank pain.  MS:  No joint pain or swelling.  No decreased range of motion.  No back pain.  Psych:  No change in mood or affect. No depression or anxiety.  No memory loss.     Objective:   Physical Exam  Filed Vitals:   05/05/14 1016  BP: 124/70  Pulse: 95  Height: 5\' 2"  (1.575 m)  Weight: 129 lb (58.514 kg)  SpO2: 98%    Gen: Pleasant, well-nourished, in no distress,  normal affect  ENT: No lesions,  mouth clear,  oropharynx clear, no postnasal drip  Neck: No JVD, no TMG, no carotid bruits  Lungs: No use of accessory muscles, no dullness to percussion, expired wheezes  Cardiovascular: RRR, heart  sounds normal, no murmur or gallops, no peripheral edema  Abdomen: soft and NT, no HSM,  BS normal  Musculoskeletal: No deformities, no cyanosis or clubbing  Neuro: alert, non focal  Skin: Warm, no lesions or rashes  No results found.        Assessment & Plan:   COPD with acute bronchitis Chronic obstructive lung disease with chronic bronchitis now with acute flare Plan Take prednisone 10mg  Take 4 for two days three for two days two for two days one for two days, then resume 5mg  daily prednisone Take azithromycin 250mg  Take two once then one daily until gone Stay on nebulizers/spiriva as prescribed An overnight oxygen test will be performed Flu vaccine was given Increase ropinirole to 2mg  at bedtime, refill sent  Return 2 months     Updated Medication List Outpatient Encounter Prescriptions as of 05/05/2014  Medication Sig  . albuterol (PROVENTIL HFA;VENTOLIN HFA) 108 (90 BASE) MCG/ACT inhaler Inhale 2 puffs into the lungs every 6 (six) hours as needed for wheezing or shortness of breath.  Marland Kitchen albuterol (PROVENTIL) (2.5 MG/3ML) 0.083% nebulizer solution Take 3 mLs (2.5 mg total) by nebulization every 6 (six) hours as needed for wheezing. Dx code 34  . aspirin 325 MG tablet Take 325 mg by mouth daily.  Marland Kitchen  atorvastatin (LIPITOR) 10 MG tablet Take 10 mg by mouth daily.    . budesonide (PULMICORT) 0.25 MG/2ML nebulizer solution Take 2 mLs (0.25 mg total) by nebulization daily. Dx code 59  . calcium carbonate (TUMS - DOSED IN MG ELEMENTAL CALCIUM) 500 MG chewable tablet Chew 1 tablet by mouth daily.   . cycloSPORINE (RESTASIS) 0.05 % ophthalmic emulsion Place 1 drop into both eyes 2 (two) times daily.  Marland Kitchen EPINEPHrine (EPIPEN 2-PAK) 0.3 mg/0.3 mL DEVI Inject into the muscle once.  Marland Kitchen esomeprazole (NEXIUM) 40 MG capsule Take 40 mg by mouth daily before breakfast.    . formoterol (PERFOROMIST) 20 MCG/2ML nebulizer solution Take 2 mLs (20 mcg total) by nebulization 2 (two) times daily.  Dx code 496  . furosemide (LASIX) 20 MG tablet Take 1 tablet (20 mg total) by mouth daily as needed.  Marland Kitchen guaiFENesin (MUCINEX) 600 MG 12 hr tablet Take 600 mg by mouth at bedtime.  Marland Kitchen HYDROcodone-homatropine (HYCODAN) 5-1.5 MG/5ML syrup Take 5 mLs by mouth every 6 (six) hours as needed for cough.  . lidocaine (LIDODERM) 5 % Place 1 patch onto the skin every 12 (twelve) hours as needed.   Marland Kitchen losartan-hydrochlorothiazide (HYZAAR) 100-25 MG per tablet Take 1 tablet by mouth daily.   . mometasone (NASONEX) 50 MCG/ACT nasal spray Place 2 sprays into the nose daily.  . polyethylene glycol powder (GLYCOLAX/MIRALAX) powder Take 17 g by mouth as needed.  . potassium chloride (KLOR-CON) 10 MEQ CR tablet Take 10 mEq by mouth 2 (two) times daily.   . predniSONE (DELTASONE) 5 MG tablet HOLD while on 10mg  prednisone then resume 5mg  prednisone  . Prenatal Vit-Fe Sulfate-FA (PRENATAL VITAMIN PO) Take 1 tablet by mouth daily.  Marland Kitchen Respiratory Therapy Supplies (FLUTTER) DEVI Use 3-4 times daily  . rOPINIRole (REQUIP) 2 MG tablet Take 1 tablet (2 mg total) by mouth at bedtime.  Marland Kitchen tiotropium (SPIRIVA) 18 MCG inhalation capsule Place 1 capsule (18 mcg total) into inhaler and inhale daily.  . vitamin B-12 (CYANOCOBALAMIN) 1000 MCG tablet Take 1,000 mcg by mouth daily.  . [DISCONTINUED] rOPINIRole (REQUIP) 2 MG tablet Take 1 mg by mouth 2 (two) times daily as needed.   Marland Kitchen azithromycin (ZITHROMAX) 250 MG tablet Take two once then one daily until gone  . predniSONE (DELTASONE) 10 MG tablet Take 4 for two days three for two days two for two days one for two days  . [DISCONTINUED] moxifloxacin (AVELOX) 400 MG tablet Take 1 tablet (400 mg total) by mouth daily.  . [DISCONTINUED] predniSONE (DELTASONE) 10 MG tablet Take 4 for three days 3 for three days 2 for three days 1 for three days and stop

## 2014-05-05 NOTE — Patient Instructions (Addendum)
Take prednisone 10mg  Take 4 for two days three for two days two for two days one for two days, then resume 5mg  daily prednisone Take azithromycin 250mg  Take two once then one daily until gone Stay on nebulizers/spiriva as prescribed An overnight oxygen test will be performed Flu vaccine was given Increase ropinirole to 2mg  at bedtime, refill sent  Return 2 months

## 2014-05-06 ENCOUNTER — Telehealth: Payer: Self-pay | Admitting: Critical Care Medicine

## 2014-05-06 DIAGNOSIS — J9611 Chronic respiratory failure with hypoxia: Secondary | ICD-10-CM

## 2014-05-06 NOTE — Assessment & Plan Note (Signed)
Chronic obstructive lung disease with chronic bronchitis now with acute flare Plan Take prednisone 10mg  Take 4 for two days three for two days two for two days one for two days, then resume 5mg  daily prednisone Take azithromycin 250mg  Take two once then one daily until gone Stay on nebulizers/spiriva as prescribed An overnight oxygen test will be performed Flu vaccine was given Increase ropinirole to 2mg  at bedtime, refill sent  Return 2 months

## 2014-05-06 NOTE — Telephone Encounter (Signed)
[id number] x1 

## 2014-05-06 NOTE — Telephone Encounter (Signed)
Called and spoke with pt and she stated that when she was seen by PW yesterday they talked about increasing her requip from 2 mg to a higher dose.  She stated that on her AVS it was still the same.  Did PW want to increase this med?    She also wanted to see if she can get a new nebulizer from [facility].  She stated that hers is very old and they dont have filters for her old one.  She would like to have a new machine.    Pt stated that she is having a cough at times too.  She is coughing up clear sputum now.  Pt is aware that we will send this over to PW to further recs.  Please advise. Thanks  Allergies  Allergen Reactions  . Doxycycline     REACTION: insomnia  . Horizant [Gabapentin]   . Other     Bee stings - internal swelling per pt  . Penicillins     REACTION: shock  . Plavix [Clopidogrel]     Arm turned purple, rash  . Prozac [Fluoxetine Hcl]   . Sulfa Antibiotics    Current Outpatient Prescriptions on File Prior to Visit  Medication Sig Dispense Refill  . albuterol (PROVENTIL HFA;VENTOLIN HFA) 108 (90 BASE) MCG/ACT inhaler Inhale 2 puffs into the lungs every 6 (six) hours as needed for wheezing or shortness of breath.  3 Inhaler  3  . albuterol (PROVENTIL) (2.5 MG/3ML) 0.083% nebulizer solution Take 3 mLs (2.5 mg total) by nebulization every 6 (six) hours as needed for wheezing. Dx code 496  120 mL  6  . aspirin 325 MG tablet Take 325 mg by mouth daily.      Marland Kitchen atorvastatin (LIPITOR) 10 MG tablet Take 10 mg by mouth daily.        Marland Kitchen azithromycin (ZITHROMAX) 250 MG tablet Take two once then one daily until gone  6 tablet  0  . budesonide (PULMICORT) 0.25 MG/2ML nebulizer solution Take 2 mLs (0.25 mg total) by nebulization daily. Dx code 496  60 mL  11  . calcium carbonate (TUMS - DOSED IN MG ELEMENTAL CALCIUM) 500 MG chewable tablet Chew 1 tablet by mouth daily.       . cycloSPORINE (RESTASIS) 0.05 % ophthalmic emulsion Place 1 drop into both eyes 2 (two) times daily.      Marland Kitchen EPINEPHrine  (EPIPEN 2-PAK) 0.3 mg/0.3 mL DEVI Inject into the muscle once.      Marland Kitchen esomeprazole (NEXIUM) 40 MG capsule Take 40 mg by mouth daily before breakfast.        . formoterol (PERFOROMIST) 20 MCG/2ML nebulizer solution Take 2 mLs (20 mcg total) by nebulization 2 (two) times daily. Dx code 496  120 mL  11  . furosemide (LASIX) 20 MG tablet Take 1 tablet (20 mg total) by mouth daily as needed.  90 tablet  4  . guaiFENesin (MUCINEX) 600 MG 12 hr tablet Take 600 mg by mouth at bedtime.      Marland Kitchen HYDROcodone-homatropine (HYCODAN) 5-1.5 MG/5ML syrup Take 5 mLs by mouth every 6 (six) hours as needed for cough.  120 mL  0  . lidocaine (LIDODERM) 5 % Place 1 patch onto the skin every 12 (twelve) hours as needed.       Marland Kitchen losartan-hydrochlorothiazide (HYZAAR) 100-25 MG per tablet Take 1 tablet by mouth daily.       . mometasone (NASONEX) 50 MCG/ACT nasal spray Place 2 sprays into the nose  daily.  17 g  11  . polyethylene glycol powder (GLYCOLAX/MIRALAX) powder Take 17 g by mouth as needed.      . potassium chloride (KLOR-CON) 10 MEQ CR tablet Take 10 mEq by mouth 2 (two) times daily.       . predniSONE (DELTASONE) 10 MG tablet Take 4 for two days three for two days two for two days one for two days  20 tablet  0  . predniSONE (DELTASONE) 5 MG tablet HOLD while on 10mg  prednisone then resume 5mg  prednisone  90 tablet  3  . Prenatal Vit-Fe Sulfate-FA (PRENATAL VITAMIN PO) Take 1 tablet by mouth daily.      Marland Kitchen Respiratory Therapy Supplies (FLUTTER) DEVI Use 3-4 times daily  1 each  0  . rOPINIRole (REQUIP) 2 MG tablet Take 1 tablet (2 mg total) by mouth at bedtime.  30 tablet  6  . tiotropium (SPIRIVA) 18 MCG inhalation capsule Place 1 capsule (18 mcg total) into inhaler and inhale daily.  90 capsule  3  . vitamin B-12 (CYANOCOBALAMIN) 1000 MCG tablet Take 1,000 mcg by mouth daily.       No current facility-administered medications on file prior to visit.

## 2014-05-06 NOTE — Telephone Encounter (Signed)
Pt returned call [phone]

## 2014-05-06 NOTE — Telephone Encounter (Signed)
The med list i have stated she was on 0.5mg  bid  Not 1mg  bid  i changed to 2mg  tab qhs

## 2014-05-06 NOTE — Telephone Encounter (Signed)
Will make pt aware of med changes.    Dr. Joya Gaskins, is it ok to order a new nebulizer?  Thank you!

## 2014-05-08 NOTE — Telephone Encounter (Signed)
Yes this is ok to order a new nebulizer

## 2014-05-09 NOTE — Telephone Encounter (Signed)
Order has been placed for the pt to get a new nebulizer from [facility].  Message has been sent to [facility].

## 2014-05-23 ENCOUNTER — Telehealth: Payer: Self-pay | Admitting: Critical Care Medicine

## 2014-05-23 DIAGNOSIS — J209 Acute bronchitis, unspecified: Secondary | ICD-10-CM

## 2014-05-23 DIAGNOSIS — J440 Chronic obstructive pulmonary disease with acute lower respiratory infection: Principal | ICD-10-CM

## 2014-05-23 NOTE — Telephone Encounter (Signed)
ONO on RA normal. No oxygen needed Pt aware

## 2014-09-29 ENCOUNTER — Ambulatory Visit: Payer: Medicare Other | Admitting: Critical Care Medicine

## 2014-10-13 ENCOUNTER — Ambulatory Visit: Payer: Medicare Other | Admitting: Critical Care Medicine

## 2014-10-24 ENCOUNTER — Encounter: Payer: Self-pay | Admitting: Neurology

## 2014-10-24 ENCOUNTER — Ambulatory Visit (INDEPENDENT_AMBULATORY_CARE_PROVIDER_SITE_OTHER): Payer: Medicare Other | Admitting: Neurology

## 2014-10-24 VITALS — BP 146/72 | HR 68 | Resp 18 | Ht 61.0 in | Wt 131.4 lb

## 2014-10-24 DIAGNOSIS — M7061 Trochanteric bursitis, right hip: Secondary | ICD-10-CM

## 2014-10-24 DIAGNOSIS — M7062 Trochanteric bursitis, left hip: Secondary | ICD-10-CM

## 2014-10-24 DIAGNOSIS — M542 Cervicalgia: Secondary | ICD-10-CM | POA: Diagnosis not present

## 2014-10-24 DIAGNOSIS — M5440 Lumbago with sciatica, unspecified side: Secondary | ICD-10-CM

## 2014-10-24 DIAGNOSIS — R413 Other amnesia: Secondary | ICD-10-CM | POA: Insufficient documentation

## 2014-10-24 DIAGNOSIS — M5431 Sciatica, right side: Secondary | ICD-10-CM | POA: Diagnosis not present

## 2014-10-24 DIAGNOSIS — M549 Dorsalgia, unspecified: Secondary | ICD-10-CM | POA: Insufficient documentation

## 2014-10-24 DIAGNOSIS — R404 Transient alteration of awareness: Secondary | ICD-10-CM | POA: Insufficient documentation

## 2014-10-24 DIAGNOSIS — G500 Trigeminal neuralgia: Secondary | ICD-10-CM | POA: Diagnosis not present

## 2014-10-24 MED ORDER — BACLOFEN 10 MG PO TABS: 10.0000 mg | ORAL_TABLET | Freq: Three times a day (TID) | ORAL | Status: AC

## 2014-10-24 MED ORDER — LIDOCAINE 5 % EX OINT: 1.0000 "application " | TOPICAL_OINTMENT | Freq: Two times a day (BID) | CUTANEOUS | Status: DC | PRN

## 2014-10-24 MED ORDER — OXYCODONE-ACETAMINOPHEN 7.5-325 MG PO TABS: 1.0000 | ORAL_TABLET | Freq: Three times a day (TID) | ORAL | Status: DC | PRN

## 2014-10-24 NOTE — Progress Notes (Signed)
[facility]  PATIENT: Tina [name] DOB: May 30, 1934  REFERRING DOCTOR OR PCP:  Lolita Patella SOURCE: Patient and office notes, imaging studies and lab reports from [facility]  _________________________________   HISTORICAL  CHIEF COMPLAINT:  Chief Complaint  Patient presents with  . Trigeminal Neuralgia    Sts. right sided facial pain has been worse over the last week.  She is not currently on any med--sts. is allergic to Gabapentin, Horizant, but is unsure what rx. she had..  . Back Pain    Sts. has had right sided lbp radiating down right lateral leg to mid calf, onset mos. ago without known injury.  She had recent le arterial duplex.  Right abi was .85--consistent with mild PAOD, and is stable since last abi.  Left abi is .92, which is wnl at rest, and is improved since last abi.  Duplex was negative for dvt or sftp in right leg.  She also had carotid duplex done 10-14-14 and show bilat stenosis of 1-39%, which is stable from last exam/fim  . Altered Mental Status    She would also like to discuss several episodes of "lost time."  Sts. last episode was about a month ago--she was in the kitchen--"didn't feel right" so she decided to lie down.  Sts. her husband had difficulty waking her up, and when she did wake up, she "found several thinks in places they shouldn't have been."  Husband sts. "almost like she was sleep walking."/fim    HISTORY OF PRESENT ILLNESS:  Tina [name] is a 81 year old woman with multiple neurologic problems.   Her worse problems are right trigeminal neuralgia, back pain and memory concerns.  Trigeminal Neuralgia:  She has had trigeminal neuralgia on the right for many years. About 5 days ago the pain got much worse.    Currently, she is having about 8-10 episodes of trigeminal neuralgia each day. The pain will last for about 2 minutes. Chewing, brushing her teeth and putting on makeup have triggered this spell was. In the past, she has  had episodes of severe trigeminal neuralgia like this. She is unable to tolerate gabapentin or Horizant. She has been on baclofen in the past for her back but not for trigeminal neuralgia in the past. She does not recall being on lamotrigine.   She denies facial numbness or weakness.  Neck pain:   She she had a lot of neck pain in the past but has been doing better over the last couple years. Interestingly, the C3-C4 level that was her worse level looks to be fused (without surgery) on her latest MRI.  Lower back pain:   Over the years, she had multiple ESI injections and RFA of facet joints with short term benefit.    She had surgery 5-6 years ago (Dr. Lang Snow) and 3 years ago (Dr, Patrice Paradise) and had a lot of benefit initially.   She has had more pain recently. She gets some pain in the lower leg but has had lot of vein procedures there. She gets some pain more in the buttock area on the right in the past, she has received some benefit from piriformis muscle and trochanteric bursa injections there.  Memory issues/spells LOC:    She has mild stable memory loss. However, she has had several episodes of altered consciousness.  About a month ago, she had an episode lasting 1 day where she was hard to arouse. Once she was up she was somewhat confused. She needed help with her  walking. The next day she was back to her baseline. In the past, she had other episodes of loss of consciousness and was worked up by having an EEG that showed a couple of sharp waves but no spikes. A 24-hour EEG did not show any epileptiform activity, even during one of the spells.  I reviewed multiple imaging and lab reports: 02/12/2013: MRI of the brain without contrast shows moderately severe subcortical and periventricular white matter changes minimally increased since 2009. There was also a right occipital lobe small infarction that was chronic. 02/12/2013: MRI of the cervical spine shows C3-C4 autofusion that is new when compared to  2008. Additionally there was more C2-C3 degenerative change than compared to the older study. 09/02/2011: X-ray lumbar spine with flexion and extension shows degenerative disc and facet changes with 6  millimeters anterolisthesis at L3-L4 that increases with flexion. 09/02/2011: X-ray of the SI joints and hips are essentially negative. 04/18/2011: 4 hour ambulatory EEG, including recording of episodes, was normal. 03/20/2011: Abnormal EEG showing occasional sharp waves in the left anterior temporal frontal region.  03/20/2010:  MRI of the brain with and without contrast showed extensive small vessel ischemic changes bilaterally. There are also some chronic ischemic changes in the pons. These changes were stable when compared to MRI from 02/01/2008. 05/29/2007 MRI of the cervical spine shows multilevel degenerative changes most significant at C3-C4 where there is central disc protrusion and facet hypertrophy causing left greater than right foraminal narrowing and at C4-C5 with there is left greater than right facet hypertrophy Laboratory results showed ESR and ANA were normal.     REVIEW OF SYSTEMS: Constitutional: No fevers, chills, sweats, or change in appetite Eyes: No visual changes, double vision, eye pain Ear, nose and throat: No hearing loss, ear pain, nasal congestion, sore throat Cardiovascular: No chest pain, palpitations Respiratory: No shortness of breath at rest or with exertion.   No wheezes GastrointestinaI: No nausea, vomiting, diarrhea, abdominal pain, fecal incontinence Genitourinary: No dysuria, urinary retention or frequency.  No nocturia. Musculoskeletal: as above. Integumentary: No rash, pruritus, skin lesions.  Has some ankle edema Neurological: as above Psychiatric: No depression at this time.  No anxiety Endocrine: No palpitations, diaphoresis, change in appetite, change in weigh or increased thirst Hematologic/Lymphatic: No anemia, purpura,  petechiae. Allergic/Immunologic: No itchy/runny eyes, nasal congestion, recent allergic reactions, rashes  ALLERGIES: Allergies  Allergen Reactions  . Doxycycline     REACTION: insomnia  . Horizant [Gabapentin]   . Other     Bee stings - internal swelling per pt  . Penicillins     REACTION: shock  . Plavix [Clopidogrel]     Arm turned purple, rash  . Prozac [Fluoxetine Hcl]   . Sulfa Antibiotics     HOME MEDICATIONS:  Current outpatient prescriptions:  .  albuterol (PROVENTIL HFA;VENTOLIN HFA) 108 (90 BASE) MCG/ACT inhaler, Inhale 2 puffs into the lungs every 6 (six) hours as needed for wheezing or shortness of breath., Disp: 3 Inhaler, Rfl: 3 .  albuterol (PROVENTIL) (2.5 MG/3ML) 0.083% nebulizer solution, Take 3 mLs (2.5 mg total) by nebulization every 6 (six) hours as needed for wheezing. Dx code 496, Disp: 120 mL, Rfl: 6 .  aspirin 325 MG tablet, Take 325 mg by mouth daily., Disp: , Rfl:  .  atorvastatin (LIPITOR) 10 MG tablet, Take 10 mg by mouth daily.  , Disp: , Rfl:  .  budesonide (PULMICORT) 0.25 MG/2ML nebulizer solution, Take 2 mLs (0.25 mg total) by nebulization daily. Dx code  496, Disp: 60 mL, Rfl: 11 .  calcium carbonate (TUMS - DOSED IN MG ELEMENTAL CALCIUM) 500 MG chewable tablet, Chew 1 tablet by mouth daily. , Disp: , Rfl:  .  cycloSPORINE (RESTASIS) 0.05 % ophthalmic emulsion, Place 1 drop into both eyes 2 (two) times daily., Disp: , Rfl:  .  EPINEPHrine (EPIPEN 2-PAK) 0.3 mg/0.3 mL DEVI, Inject into the muscle once., Disp: , Rfl:  .  esomeprazole (NEXIUM) 40 MG capsule, Take 40 mg by mouth daily before breakfast.  , Disp: , Rfl:  .  formoterol (PERFOROMIST) 20 MCG/2ML nebulizer solution, Take 2 mLs (20 mcg total) by nebulization 2 (two) times daily. Dx code 496, Disp: 120 mL, Rfl: 11 .  furosemide (LASIX) 20 MG tablet, Take 1 tablet (20 mg total) by mouth daily as needed., Disp: 90 tablet, Rfl: 4 .  guaiFENesin (MUCINEX) 600 MG 12 hr tablet, Take 600 mg by mouth  at bedtime., Disp: , Rfl:  .  losartan-hydrochlorothiazide (HYZAAR) 100-25 MG per tablet, Take 1 tablet by mouth daily. , Disp: , Rfl:  .  mometasone (NASONEX) 50 MCG/ACT nasal spray, Place 2 sprays into the nose daily., Disp: 17 g, Rfl: 11 .  Omega-3 Fatty Acids (FISH OIL) 645 MG CAPS, Take by mouth 2 (two) times daily., Disp: , Rfl:  .  polyethylene glycol powder (GLYCOLAX/MIRALAX) powder, Take 17 g by mouth as needed., Disp: , Rfl:  .  potassium chloride (KLOR-CON) 10 MEQ CR tablet, Take 10 mEq by mouth 2 (two) times daily. , Disp: , Rfl:  .  predniSONE (DELTASONE) 5 MG tablet, HOLD while on 78m prednisone then resume 522mprednisone, Disp: 90 tablet, Rfl: 3 .  Prenatal Vit-Fe Sulfate-FA (PRENATAL VITAMIN PO), Take 1 tablet by mouth daily., Disp: , Rfl:  .  Respiratory Therapy Supplies (FLUTTER) DEVI, Use 3-4 times daily, Disp: 1 each, Rfl: 0 .  rOPINIRole (REQUIP) 2 MG tablet, Take 1 tablet (2 mg total) by mouth at bedtime., Disp: 30 tablet, Rfl: 6 .  tiotropium (SPIRIVA) 18 MCG inhalation capsule, Place 1 capsule (18 mcg total) into inhaler and inhale daily., Disp: 90 capsule, Rfl: 3 .  vitamin B-12 (CYANOCOBALAMIN) 1000 MCG tablet, Take 1,000 mcg by mouth daily., Disp: , Rfl:  .  azithromycin (ZITHROMAX) 250 MG tablet, Take two once then one daily until gone (Patient not taking: Reported on 10/24/2014), Disp: 6 tablet, Rfl: 0 .  HYDROcodone-homatropine (HYCODAN) 5-1.5 MG/5ML syrup, Take 5 mLs by mouth every 6 (six) hours as needed for cough. (Patient not taking: Reported on 10/24/2014), Disp: 120 mL, Rfl: 0 .  lidocaine (LIDODERM) 5 %, Place 1 patch onto the skin every 12 (twelve) hours as needed. , Disp: , Rfl:  .  predniSONE (DELTASONE) 10 MG tablet, Take 4 for two days three for two days two for two days one for two days (Patient not taking: Reported on 10/24/2014), Disp: 20 tablet, Rfl: 0  PAST MEDICAL HISTORY: Past Medical History  Diagnosis Date  . Malignant melanoma     no recurrence  on leg  . HTN (hypertension)   . COPD (chronic obstructive pulmonary disease)     FeV1 51% 4/09  . Allergic rhinitis   . Degenerative arthritis of foot   . Vision abnormalities     PAST SURGICAL HISTORY: Past Surgical History  Procedure Laterality Date  . Appendectomy    . Mastectomy      Bilateral 1980s  . Cholecystectomy    . Total abdominal hysterectomy  FAMILY HISTORY: Family History  Problem Relation Age of Onset  . Diabetes    . Alzheimer's disease      SOCIAL HISTORY:  History   Social History  . Marital Status: Married    Spouse Name: N/A  . Number of Children: N/A  . Years of Education: N/A   Occupational History  . Not on file.   Social History Main Topics  . Smoking status: Former Smoker -- 0.30 packs/day for 30 years    Types: Cigarettes    Quit date: 07/29/1982  . Smokeless tobacco: Never Used  . Alcohol Use: Not on file  . Drug Use: Not on file  . Sexual Activity: Not on file   Other Topics Concern  . Not on file   Social History Narrative   patient signed designated party release granting access to [facility] to husband Fritz Pickerel  detailled message may be left on home phone Shanon Payor  May 10, 2010 12:06 PM     PHYSICAL EXAM  Filed Vitals:   10/24/14 1037  BP: 146/72  Pulse: 68  Resp: 18  Height: _0  (1.549 m)  Weight: 131 lb 6.4 oz (59.603 kg)    Body mass index is 24.84 kg/(m^2).   General: The patient is well-developed and well-nourished and in no acute distress  Neck: The neck is supple, no carotid bruits are noted.  The neck is minimally tender with mild ROM reduction  Skin: Extremities are with mild right > left pedal edema.  Musculoskeletal:  Back is very tender over right piriformis muscle and hips tender over both trochanteric bursae.  Only mild lumbar paraspinal tenderness  Neurologic Exam  Mental status: The patient is alert and oriented x 3 at the time of the examination. The patient has apparent normal  recent and remote memory, with an apparently normal attention span and concentration ability.   Speech is normal.  Cranial nerves: Extraocular movements are full.  Facial symmetry is present. There is good facial sensation to soft touch bilaterally.Facial strength is normal.  Trapezius and sternocleidomastoid strength is normal. No dysarthria is noted.  The tongue is midline, and the patient has symmetric elevation of the soft palate. No obvious hearing deficits are noted.  Motor:  Muscle bulk is normal.   Tone is normal. Strength is  5 / 5 in all 4 extremities.   Sensory: Sensory testing is intact to pinprick, soft touch and vibration sensation in all 4 extremities.  Coordination: Cerebellar testing reveals good finger-nose-finger.  Gait and station: Station is normal.   Gait is normal. Tandem gait is normal.    Reflexes: Deep tendon reflexes are symmetric and normal bilaterally.       DIAGNOSTIC DATA (LABS, IMAGING, TESTING) - I reviewed patient records, labs, notes, testing and imaging myself where available.    ASSESSMENT AND PLAN  NEURALGIA, TRIGEMINAL  Midline low back pain with sciatica, sciatica laterality unspecified  Trochanteric bursitis of both hips  Altered awareness, transient  Memory loss  Right sided sciatica  Cervicalgia   In summary, Pearlena Arnett is a 81 year old woman with multiple neurologic and muscular skeletal issues. She gets episodes of trigeminal neuralgia and is in the misdt of one now.   As most of the antiepileptic medications take a while to kick in, I will have her try baclofen first. If she does not note any improvement by the end of the week, I will add Trileptal 150 mg 3 times a day. I am not sure what to make  of the episode of transient alteration of awareness. She hasn't had other episodes with cognitive changes in the past. Evaluation did not show any acute findings on MRI in the past and EEG did not show any seizure activity. If these persist  we will need to consider a repeat 24-hour EEG and possibly repeat imaging.  For her trochanteric bursitis, I performed a bilateral trochanteric bursa injection using sterile technique with a total of 5 mL of Marcaine containing 40 mg of depot Medrol. She tolerated the procedure well and felt better afterwards. For her piriformis muscle tenderness with sciatica, I performed a right piriformis muscle injection using sterile technique with 3 mL of Marcaine containing 40 mg of depot Medrol. She tolerated the procedure well.  She will return to see me in 4 months or sooner if she has new or worsening neurologic symptoms.   Richard A. Felecia Shelling, MD, PhD 7/67/0110, 03:49 AM Certified in Neurology, Clinical Neurophysiology, Sleep Medicine, Pain Medicine and Neuroimaging  [facility] [address] [place], [place] [address] [phone]

## 2014-10-27 ENCOUNTER — Encounter: Payer: Self-pay | Admitting: Critical Care Medicine

## 2014-10-27 ENCOUNTER — Ambulatory Visit (INDEPENDENT_AMBULATORY_CARE_PROVIDER_SITE_OTHER): Payer: Medicare Other | Admitting: Critical Care Medicine

## 2014-10-27 VITALS — BP 139/67 | HR 72 | Temp 97.6°F | Ht 61.0 in | Wt 131.0 lb

## 2014-10-27 DIAGNOSIS — J441 Chronic obstructive pulmonary disease with (acute) exacerbation: Secondary | ICD-10-CM

## 2014-10-27 DIAGNOSIS — Z9189 Other specified personal risk factors, not elsewhere classified: Secondary | ICD-10-CM | POA: Diagnosis not present

## 2014-10-27 DIAGNOSIS — J440 Chronic obstructive pulmonary disease with acute lower respiratory infection: Secondary | ICD-10-CM

## 2014-10-27 DIAGNOSIS — J209 Acute bronchitis, unspecified: Secondary | ICD-10-CM

## 2014-10-27 DIAGNOSIS — Z9181 History of falling: Secondary | ICD-10-CM

## 2014-10-27 MED ORDER — ESOMEPRAZOLE MAGNESIUM 40 MG PO CPDR: 40.0000 mg | DELAYED_RELEASE_CAPSULE | Freq: Every day | ORAL | Status: DC

## 2014-10-27 MED ORDER — FORMOTEROL FUMARATE 20 MCG/2ML IN NEBU: 20.0000 ug | INHALATION_SOLUTION | Freq: Two times a day (BID) | RESPIRATORY_TRACT | Status: AC

## 2014-10-27 MED ORDER — ALBUTEROL SULFATE (2.5 MG/3ML) 0.083% IN NEBU: 2.5000 mg | INHALATION_SOLUTION | Freq: Four times a day (QID) | RESPIRATORY_TRACT | Status: DC | PRN

## 2014-10-27 MED ORDER — BUDESONIDE 0.25 MG/2ML IN SUSP: 0.2500 mg | Freq: Every day | RESPIRATORY_TRACT | Status: AC

## 2014-10-27 MED ORDER — ESOMEPRAZOLE MAGNESIUM 40 MG PO CPDR: 40.0000 mg | DELAYED_RELEASE_CAPSULE | Freq: Every day | ORAL | Status: AC

## 2014-10-27 NOTE — Progress Notes (Signed)
Subjective:    Patient ID: Tina [name], female    DOB: 08/10/1933, 81 y.o.   MRN: [id number]  HPI  10/27/2014 Chief Complaint  Patient presents with  . Follow-up    Breathing unchanged - still has SOB qhs, chest tightness, edema in RLE, and cough with mostly green mucus.  No fever.  Dyspnea the same, no recent bronchitis spells.  Dyspnea with exertion and notes some chest tightness.  Coughing green mucus, R side of nose notes some green mucus. Pt denies any significant sore throat, nasal congestion or excess secretions, fever, chills, sweats, unintended weight loss, pleurtic or exertional chest pain, orthopnea PND, or leg swelling Pt denies any increase in rescue therapy over baseline, denies waking up needing it or having any early am or nocturnal exacerbations of coughing/wheezing/or dyspnea. Pt also denies any obvious fluctuation in symptoms with  weather or environmental change or other alleviating or aggravating factors   Review of Systems Constitutional:   No  weight loss, night sweats,  Fevers, chills, fatigue, lassitude. HEENT:   No headaches,  Difficulty swallowing,  Tooth/dental problems,  Sore throat,                No sneezing, itching, ear ache, ++nasal congestion, ++ post nasal drip,   CV:  No chest pain,  Orthopnea, PND, swelling in lower extremities, anasarca, dizziness, palpitations  GI  No heartburn, indigestion, abdominal pain, nausea, vomiting, diarrhea, change in bowel habits, loss of appetite  Resp: Notes shortness of breath with exertion not at rest.  No excess mucus, notes  productive cough,  No non-productive cough,  No coughing up of blood.  No change in color of mucus.  No wheezing.  No chest wall deformity  Skin: no rash or lesions.  GU: no dysuria, change in color of urine, no urgency or frequency.  No flank pain.  MS:  No joint pain or swelling.  No decreased range of motion.  No back pain.  Psych:  No change in mood or affect. No depression or  anxiety.  No memory loss.     Objective:   Physical Exam Filed Vitals:   10/27/14 1209  BP: 139/67  Pulse: 72  Temp: 97.6 F (36.4 C)  TempSrc: Oral  Height: 5\' 1"  (1.549 m)  Weight: 131 lb (59.421 kg)  SpO2: 97%    Gen: Pleasant, well-nourished, in no distress,  normal affect  ENT: No lesions,  mouth clear,  oropharynx clear, no postnasal drip  Neck: No JVD, no TMG, no carotid bruits  Lungs: No use of accessory muscles, no dullness to percussion, no wheezes  Cardiovascular: RRR, heart sounds normal, no murmur or gallops, no peripheral edema  Abdomen: soft and NT, no HSM,  BS normal  Musculoskeletal: No deformities, no cyanosis or clubbing  Neuro: alert, non focal  Skin: Warm, no lesions or rashes  No results found.        Assessment & Plan:   COPD with acute bronchitis Copd with chronic bronchitis stable at present Plan No change in inhaled meds   At high risk for injury related to fall Pt falling frequently with balance issues Plan PT consult      Updated Medication List Outpatient Encounter Prescriptions as of 10/27/2014  Medication Sig  . albuterol (PROVENTIL HFA;VENTOLIN HFA) 108 (90 BASE) MCG/ACT inhaler Inhale 2 puffs into the lungs every 6 (six) hours as needed for wheezing or shortness of breath.  Marland Kitchen albuterol (PROVENTIL) (2.5 MG/3ML) 0.083% nebulizer solution Take  3 mLs (2.5 mg total) by nebulization every 6 (six) hours as needed for wheezing. Dx code J44.1  . aspirin 325 MG tablet Take 325 mg by mouth daily.  Marland Kitchen atorvastatin (LIPITOR) 10 MG tablet Take 10 mg by mouth daily.    . baclofen (LIORESAL) 10 MG tablet Take 1 tablet (10 mg total) by mouth 3 (three) times daily. (Patient taking differently: Take 10 mg by mouth 3 (three) times daily as needed. )  . budesonide (PULMICORT) 0.25 MG/2ML nebulizer solution Take 2 mLs (0.25 mg total) by nebulization daily. Dx code J44.1  . calcium carbonate (TUMS - DOSED IN MG ELEMENTAL CALCIUM) 500 MG  chewable tablet Chew 1 tablet by mouth daily.   Marland Kitchen EPINEPHrine (EPIPEN 2-PAK) 0.3 mg/0.3 mL DEVI Inject into the muscle once.  . formoterol (PERFOROMIST) 20 MCG/2ML nebulizer solution Take 2 mLs (20 mcg total) by nebulization 2 (two) times daily. Dx code J44.1  . furosemide (LASIX) 20 MG tablet Take 1 tablet (20 mg total) by mouth daily as needed.  Marland Kitchen guaiFENesin (MUCINEX) 600 MG 12 hr tablet Take 600 mg by mouth at bedtime.  Marland Kitchen HYDROcodone-homatropine (HYCODAN) 5-1.5 MG/5ML syrup Take 5 mLs by mouth every 6 (six) hours as needed for cough.  . lidocaine (XYLOCAINE) 5 % ointment Apply 1 application topically 2 (two) times daily as needed. Apply one inch to feet and toes twice daily  . losartan-hydrochlorothiazide (HYZAAR) 100-25 MG per tablet Take 1 tablet by mouth daily.   . mometasone (NASONEX) 50 MCG/ACT nasal spray Place 2 sprays into the nose daily.  . Omega-3 Fatty Acids (FISH OIL) 645 MG CAPS Take by mouth 2 (two) times daily.  Marland Kitchen oxyCODONE-acetaminophen (PERCOCET) 7.5-325 MG per tablet Take 1 tablet by mouth every 8 (eight) hours as needed for pain.  . polyethylene glycol powder (GLYCOLAX/MIRALAX) powder Take 17 g by mouth as needed.  . potassium chloride (KLOR-CON) 10 MEQ CR tablet Take 10 mEq by mouth 2 (two) times daily.   . predniSONE (DELTASONE) 5 MG tablet HOLD while on 10mg  prednisone then resume 5mg  prednisone (Patient taking differently: Take 5 mg by mouth daily. )  . Prenatal Vit-Fe Sulfate-FA (PRENATAL VITAMIN PO) Take 1 tablet by mouth daily.  Marland Kitchen Respiratory Therapy Supplies (FLUTTER) DEVI Use 3-4 times daily  . rOPINIRole (REQUIP) 2 MG tablet Take 1 tablet (2 mg total) by mouth at bedtime.  Marland Kitchen tiotropium (SPIRIVA) 18 MCG inhalation capsule Place 1 capsule (18 mcg total) into inhaler and inhale daily.  . vitamin B-12 (CYANOCOBALAMIN) 1000 MCG tablet Take 1,000 mcg by mouth daily.  . [DISCONTINUED] albuterol (PROVENTIL) (2.5 MG/3ML) 0.083% nebulizer solution Take 3 mLs (2.5 mg total)  by nebulization every 6 (six) hours as needed for wheezing. Dx code 69  . [DISCONTINUED] budesonide (PULMICORT) 0.25 MG/2ML nebulizer solution Take 2 mLs (0.25 mg total) by nebulization daily. Dx code 39  . [DISCONTINUED] formoterol (PERFOROMIST) 20 MCG/2ML nebulizer solution Take 2 mLs (20 mcg total) by nebulization 2 (two) times daily. Dx code 60  . esomeprazole (NEXIUM) 40 MG capsule Take 1 capsule (40 mg total) by mouth daily before breakfast.  . [DISCONTINUED] azithromycin (ZITHROMAX) 250 MG tablet Take two once then one daily until gone (Patient not taking: Reported on 10/24/2014)  . [DISCONTINUED] cycloSPORINE (RESTASIS) 0.05 % ophthalmic emulsion Place 1 drop into both eyes 2 (two) times daily.  . [DISCONTINUED] esomeprazole (NEXIUM) 40 MG capsule Take 40 mg by mouth daily before breakfast.    . [DISCONTINUED] esomeprazole (NEXIUM) 40 MG  capsule Take 1 capsule (40 mg total) by mouth daily before breakfast.  . [DISCONTINUED] predniSONE (DELTASONE) 10 MG tablet Take 4 for two days three for two days two for two days one for two days (Patient not taking: Reported on 10/24/2014)

## 2014-10-27 NOTE — Patient Instructions (Signed)
PT consult will be made at [facility] on all meds given Return 2 months

## 2014-10-28 NOTE — Assessment & Plan Note (Addendum)
Copd with chronic bronchitis stable at present Plan No change in inhaled meds

## 2014-10-28 NOTE — Assessment & Plan Note (Signed)
Pt falling frequently with balance issues Plan PT consult

## 2014-11-18 ENCOUNTER — Ambulatory Visit (INDEPENDENT_AMBULATORY_CARE_PROVIDER_SITE_OTHER): Payer: Medicare Other | Admitting: Internal Medicine

## 2014-11-18 ENCOUNTER — Encounter: Payer: Self-pay | Admitting: Internal Medicine

## 2014-11-18 VITALS — BP 150/80 | HR 113 | Temp 98.2°F | Wt 132.6 lb

## 2014-11-18 DIAGNOSIS — J449 Chronic obstructive pulmonary disease, unspecified: Secondary | ICD-10-CM

## 2014-11-18 MED ORDER — PREDNISONE 10 MG PO TABS: ORAL_TABLET | ORAL | Status: DC

## 2014-11-18 NOTE — Progress Notes (Signed)
   Subjective:    Patient ID: Tina [name], female    DOB: October 22, 1933  .   MRN: [id number]  Brief patient profile:  81 yowf quit smoking around 1990 with GOLD II  COPD documented Oct 2011    10/27/2014 Chief Complaint  Patient presents with  . Follow-up    Breathing unchanged - still has SOB qhs, chest tightness, edema in RLE, and cough with mostly green mucus.  No fever.  Dyspnea the same, no recent bronchitis spells.  Dyspnea with exertion and notes some chest tightness.  Coughing green mucus, R side of nose notes some green mucus. rec PT consult will be made at [facility] on all meds given    11/18/2014 acute  ov/W[facility] re: aecopd on laba/ics neb and spiriva  Chief Complaint  Patient presents with  . Acute Visit    Pt of PW. C/O increased SOB x 10 days. Productive cough with white, green mucus, wheezing. Pt states seeing PCP last week. Given pred dos pack. States no relief   11/15/14 rx pred and no better Baseline using saba each am = neb  form and saba hfa at least once a day and did not titrate up when flared    No obvious day to day or daytime variabilty or assoc  cp or chest tightness, subjective wheeze overt sinus or hb symptoms. No unusual exp hx or h/o childhood pna/ asthma or knowledge of premature birth.  Sleeping ok without nocturnal  or early am exacerbation  of respiratory  c/o's or need for noct saba. Also denies any obvious fluctuation of symptoms with weather or environmental changes or other aggravating or alleviating factors except as outlined above   Current Medications, Allergies, Complete Past Medical History, Past Surgical History, Family History, and Social History were reviewed in [employer] record.  ROS  The following are not active complaints unless bolded sore throat, dysphagia, dental problems, itching, sneezing,  nasal congestion or excess/ purulent secretions, ear ache,   fever, chills, sweats, unintended wt loss,  pleuritic or exertional cp, hemoptysis,  orthopnea pnd or leg swelling chronic on R , presyncope, palpitations, heartburn, abdominal pain, anorexia, nausea, vomiting, diarrhea  or change in bowel or urinary habits, change in stools or urine, dysuria,hematuria,  rash, arthralgias, visual complaints, headache, numbness weakness or ataxia or problems with walking or coordination,  change in mood/affect or memory.              Objective:   Physical Exam  Wt Readings from Last 3 Encounters:  11/18/14 132 lb 9.6 oz (60.147 kg)  10/27/14 131 lb (59.421 kg)  10/24/14 131 lb 6.4 oz (59.603 kg)    Vital signs reviewed    Gen: Pleasant, well-nourished, in no distress,  normal affect  ENT: No lesions,  mouth clear,  oropharynx clear, no postnasal drip - top dentures   Neck: No JVD, no TMG, no carotid bruits  Lungs: No use of accessory muscles, no dullness to percussion, no wheezes  Cardiovascular: RRR, heart sounds normal, no murmur or gallops,  R pitting edema  Abdomen: soft and NT, no HSM,  BS normal  Musculoskeletal: No deformities, no cyanosis or clubbing  Neuro: alert, non focal  Skin: Warm, no lesions or rashes               Assessment & Plan:

## 2014-11-18 NOTE — Patient Instructions (Addendum)
Prednisone 10 mg take  4 each am x 2 days,   2 each am x 2 days,  1 each am x 2 days and stop   Finish antibiotics  Plan A =  Automatic = performist/budosonide each am and  In 12 hours and spirva each am  Plan B = backup  Only use your albuterol /ventolin as a rescue medication to be used if you can't catch your breath by resting or doing a relaxed purse lip breathing pattern.  - The less you use it, the better it will work when you need it. - Ok to use up to 2 puffs  every 4 hours if you must but call for immediate appointment if use goes up over your usual need - Don't leave home without it !!  (think of it like the spare tire for your car)   Plan C = crisis > use albuterol 2.5 mg every 4 hours if need it and you need Plan B and it doesn't work  For cough > mucinex 1200 mg every 12 hours and flutter valve and if not effective ok to use hydo cough syrup   GERD (REFLUX)  is an extremely common cause of respiratory symptoms just like yours , many times with no obvious heartburn at all.    It can be treated with medication, but also with lifestyle changes including avoidance of late meals, excessive alcohol, smoking cessation, and avoid fatty foods, chocolate, peppermint, colas, red wine, and acidic juices such as orange juice.  NO MINT OR MENTHOL PRODUCTS SO NO COUGH DROPS  USE SUGARLESS CANDY INSTEAD (Jolley ranchers or Stover's or Life Savers) or even ice chips will also do - the key is to swallow to prevent all throat clearing. NO OIL BASED VITAMINS - use powdered substitutes.  If not happy with above the step is to see Tammy with all medications in hand  Needs cxr on return

## 2014-11-19 ENCOUNTER — Encounter: Payer: Self-pay | Admitting: Internal Medicine

## 2014-11-19 DIAGNOSIS — J449 Chronic obstructive pulmonary disease, unspecified: Secondary | ICD-10-CM | POA: Insufficient documentation

## 2014-11-19 NOTE — Assessment & Plan Note (Addendum)
Spirometry 04/2010  FEV1 1.04 (59%) ratio 49   The proper method of use, as well as anticipated side effects, of a metered-dose inhaler are discussed and demonstrated to the patient. Improved effectiveness after extensive coaching during this visit to a level of approximately  75%    Acute flare with poor insight how to manage maint vs prns  I had an extended discussion with the patient reviewing all relevant studies completed to date and  lasting 15 to 20 minutes of a 25 minute visit on the following ongoing concerns:    1) Each maintenance medication was reviewed in detail including most importantly the difference between maintenance and as needed and under what circumstances the prns are to be used.  Please see instructions for details which were reviewed in writing and the patient given a copy.    2) use of flutter valve/ max mucinex reviewed   3) ? Acid (or non-acid) GERD > always difficult to exclude as up to 75% of pts in some series report no assoc GI/ Heartburn symptoms> rec continue max (24h)  acid suppression and diet restrictions/ reviewed     4) rec f/u with med calendar if not improving and complete the pred taper/ abx already started   See instructions for specific recommendations which were reviewed directly with the patient who was given a copy with highlighter outlining the key components.

## 2014-11-29 ENCOUNTER — Other Ambulatory Visit: Payer: Self-pay | Admitting: Internal Medicine

## 2014-12-06 ENCOUNTER — Telehealth: Payer: Self-pay | Admitting: Neurology

## 2014-12-06 MED ORDER — OXYCODONE-ACETAMINOPHEN 7.5-325 MG PO TABS: 1.0000 | ORAL_TABLET | Freq: Three times a day (TID) | ORAL | Status: DC | PRN

## 2014-12-06 NOTE — Addendum Note (Signed)
Addended by: France Ravens I on: 12/06/2014 04:36 PM   Modules accepted: Orders

## 2014-12-06 NOTE — Telephone Encounter (Signed)
Patient called and stated that she would like a Rx. OXYCODONE 7.5 mg tablets. Please call and advise.

## 2014-12-06 NOTE — Telephone Encounter (Signed)
I have spoken with Tina [name] and advised rx. will be available to be picked up at [facility] in the am.  Last r/f was in March.  Rx. printed, signed, up front [facility]/fim

## 2014-12-18 DIAGNOSIS — E782 Mixed hyperlipidemia: Secondary | ICD-10-CM | POA: Insufficient documentation

## 2014-12-18 DIAGNOSIS — I10 Essential (primary) hypertension: Secondary | ICD-10-CM | POA: Insufficient documentation

## 2014-12-18 DIAGNOSIS — I739 Peripheral vascular disease, unspecified: Secondary | ICD-10-CM | POA: Insufficient documentation

## 2014-12-18 DIAGNOSIS — J441 Chronic obstructive pulmonary disease with (acute) exacerbation: Secondary | ICD-10-CM | POA: Insufficient documentation

## 2014-12-19 DIAGNOSIS — R0902 Hypoxemia: Secondary | ICD-10-CM | POA: Insufficient documentation

## 2014-12-20 DIAGNOSIS — F419 Anxiety disorder, unspecified: Secondary | ICD-10-CM | POA: Insufficient documentation

## 2014-12-21 DIAGNOSIS — D729 Disorder of white blood cells, unspecified: Secondary | ICD-10-CM | POA: Insufficient documentation

## 2014-12-22 ENCOUNTER — Ambulatory Visit: Payer: Medicare Other | Admitting: Critical Care Medicine

## 2015-01-03 ENCOUNTER — Other Ambulatory Visit: Payer: Self-pay | Admitting: Neurology

## 2015-01-03 ENCOUNTER — Telehealth: Payer: Self-pay | Admitting: Neurology

## 2015-01-03 MED ORDER — OXYCODONE-ACETAMINOPHEN 7.5-325 MG PO TABS: 1.0000 | ORAL_TABLET | Freq: Three times a day (TID) | ORAL | Status: DC | PRN

## 2015-01-03 NOTE — Telephone Encounter (Signed)
Request entered, forwarded to provider for approval.  

## 2015-01-03 NOTE — Telephone Encounter (Addendum)
Patient is callling to pick up a written Rx oxydodone-acetaminophen 8/5-325 mg.  She would like to pick up in a little while as she is not from [place] and is in town. I told her it is normally 24 hr turn-around.  Please call her.

## 2015-01-23 ENCOUNTER — Telehealth: Payer: Self-pay

## 2015-01-23 ENCOUNTER — Ambulatory Visit (INDEPENDENT_AMBULATORY_CARE_PROVIDER_SITE_OTHER): Payer: Medicare Other | Admitting: Neurology

## 2015-01-23 ENCOUNTER — Encounter: Payer: Self-pay | Admitting: Neurology

## 2015-01-23 VITALS — BP 160/58 | HR 68 | Resp 16 | Ht 61.0 in | Wt 124.4 lb

## 2015-01-23 DIAGNOSIS — M7061 Trochanteric bursitis, right hip: Secondary | ICD-10-CM

## 2015-01-23 DIAGNOSIS — M5442 Lumbago with sciatica, left side: Secondary | ICD-10-CM

## 2015-01-23 DIAGNOSIS — M5432 Sciatica, left side: Secondary | ICD-10-CM | POA: Diagnosis not present

## 2015-01-23 DIAGNOSIS — M7062 Trochanteric bursitis, left hip: Secondary | ICD-10-CM | POA: Diagnosis not present

## 2015-01-23 DIAGNOSIS — G500 Trigeminal neuralgia: Secondary | ICD-10-CM

## 2015-01-23 MED ORDER — OXYCODONE-ACETAMINOPHEN 7.5-325 MG PO TABS: 1.0000 | ORAL_TABLET | Freq: Three times a day (TID) | ORAL | Status: DC | PRN

## 2015-01-23 NOTE — Telephone Encounter (Signed)
I have spoken with Tina [name] this morning--she c/o new back pain that she has not been seen in the office for.  Oxycodone not helping. Appt. given for 1040 this morning/fim

## 2015-01-23 NOTE — Progress Notes (Signed)
[facility]  PATIENT: Tina [name] DOB: [birth year]  REFERRING DOCTOR OR PCP:  Lolita Patella SOURCE: Patient and office notes, imaging studies and lab reports from [facility]  _________________________________   HISTORICAL  CHIEF COMPLAINT:  Chief Complaint  Patient presents with  . Trigeminal Neuralgia    Sts. right sided facial pain has been worse--at last office visit, the plan was for her to try Baclofen for pain, then add Trileptal prn.  She has not tried Baclofen yet--would like to discuss this again.  Sts. has a history of right sided back pain, but sts. 2 weeks ago had sudden onset of left sided lbp radiating into left buttock, with numbness left lateral leg to knee.  No known injury.Hilton Cork    HISTORY OF PRESENT ILLNESS:  Tina [name] is a 81 year old woman with multiple neurologic problems.   Her worse problems are right trigeminal neuralgia, back pain and memory concerns.  Trigeminal Neuralgia:  She has had trigeminal neuralgia on the right for many years. About 5 days ago the pain got much worse.    Currently, she is having about 8-10 episodes of trigeminal neuralgia each day. The pain will last for about 2 minutes. Chewing, brushing her teeth and putting on makeup have triggered this spell was. In the past, she has had episodes of severe trigeminal neuralgia like this. She is unable to tolerate gabapentin or Horizant. She has been on baclofen in the past for her back but not for trigeminal neuralgia in the past. She does not recall being on lamotrigine.   She denies facial numbness or weakness.  Neck pain:   She she had a lot of neck pain in the past but has been doing better over the last couple years. Interestingly, the C3-C4 level that was her worse level looks to be fused (without surgery) on her latest MRI.  Lower back pain:   Over the years, she had multiple ESI injections and RFA of facet joints with short term benefit.    She had surgery 5-6  years ago (Dr. Lang Snow) and 3 years ago (Dr, Patrice Paradise) and had a lot of benefit initially.   She has had more pain recently. She gets some pain in the lower leg but has had lot of vein procedures there. She gets some pain more in the buttock area on the right in the past, she has received some benefit from piriformis muscle and trochanteric bursa injections there.  MRI report dated 04/18/2015 shows a large rounded structure in the left lateral recess at L1-L2 most consistent with a large herniated extruded disc fragment. There is no comment about the adjacent nerve roots, although I would be concerned about left L2 with that sort of HNP.     REVIEW OF SYSTEMS: Constitutional: No fevers, chills, sweats, or change in appetite Eyes: No visual changes, double vision, eye pain Ear, nose and throat: No hearing loss, ear pain, nasal congestion, sore throat Cardiovascular: No chest pain, palpitations Respiratory: No shortness of breath at rest or with exertion.   No wheezes GastrointestinaI: No nausea, vomiting, diarrhea, abdominal pain, fecal incontinence Genitourinary: No dysuria, urinary retention or frequency.  No nocturia. Musculoskeletal: as above. Integumentary: No rash, pruritus, skin lesions.  Has some ankle edema Neurological: as above Psychiatric: No depression at this time.  No anxiety Endocrine: No palpitations, diaphoresis, change in appetite, change in weigh or increased thirst Hematologic/Lymphatic: No anemia, purpura, petechiae. Allergic/Immunologic: No itchy/runny eyes, nasal congestion, recent allergic reactions, rashes  ALLERGIES: Allergies  Allergen Reactions  . Doxycycline     REACTION: insomnia  . Fluoxetine   . Horizant [Gabapentin]   . Other     Bee stings - internal swelling per pt Bee stings - internal swelling per pt  . Penicillins     REACTION: shock  . Plavix [Clopidogrel]     Arm turned purple, rash  . Prozac [Fluoxetine Hcl]   . Sulfa Antibiotics   .  Codeine Rash    HOME MEDICATIONS:  Current outpatient prescriptions:  .  albuterol (PROVENTIL HFA;VENTOLIN HFA) 108 (90 BASE) MCG/ACT inhaler, Inhale 2 puffs into the lungs every 6 (six) hours as needed for wheezing or shortness of breath., Disp: 3 Inhaler, Rfl: 3 .  albuterol (PROVENTIL) (2.5 MG/3ML) 0.083% nebulizer solution, Take 3 mLs (2.5 mg total) by nebulization every 6 (six) hours as needed for wheezing. Dx code J44.1, Disp: 120 mL, Rfl: 6 .  aspirin 325 MG tablet, Take 325 mg by mouth daily., Disp: , Rfl:  .  atorvastatin (LIPITOR) 10 MG tablet, Take 10 mg by mouth daily.  , Disp: , Rfl:  .  baclofen (LIORESAL) 10 MG tablet, Take 1 tablet (10 mg total) by mouth 3 (three) times daily., Disp: 90 each, Rfl: 5 .  budesonide (PULMICORT) 0.25 MG/2ML nebulizer solution, Take 2 mLs (0.25 mg total) by nebulization daily. Dx code J44.1, Disp: 60 mL, Rfl: 11 .  EPINEPHrine (EPIPEN 2-PAK) 0.3 mg/0.3 mL DEVI, Inject into the muscle once., Disp: , Rfl:  .  esomeprazole (NEXIUM) 40 MG capsule, Take 1 capsule (40 mg total) by mouth daily before breakfast., Disp: 90 capsule, Rfl: 3 .  formoterol (PERFOROMIST) 20 MCG/2ML nebulizer solution, Take 2 mLs (20 mcg total) by nebulization 2 (two) times daily. Dx code J44.1, Disp: 120 mL, Rfl: 11 .  furosemide (LASIX) 20 MG tablet, Take 1 tablet (20 mg total) by mouth daily as needed., Disp: 90 tablet, Rfl: 4 .  lidocaine (XYLOCAINE) 5 % ointment, Apply 1 application topically 2 (two) times daily as needed. Apply one inch to feet and toes twice daily, Disp: 35.44 g, Rfl: 3 .  losartan-hydrochlorothiazide (HYZAAR) 100-25 MG per tablet, Take 1 tablet by mouth daily. , Disp: , Rfl:  .  oxyCODONE-acetaminophen (PERCOCET) 7.5-325 MG per tablet, Take 1 tablet by mouth every 8 (eight) hours as needed., Disp: 90 tablet, Rfl: 0 .  polyethylene glycol powder (GLYCOLAX/MIRALAX) powder, Take 17 g by mouth as needed., Disp: , Rfl:  .  potassium chloride (KLOR-CON) 10 MEQ CR  tablet, Take 10 mEq by mouth 2 (two) times daily. , Disp: , Rfl:  .  predniSONE (DELTASONE) 5 MG tablet, HOLD while on 10mg  prednisone then resume 5mg  prednisone, Disp: 90 tablet, Rfl: 3 .  Prenatal Vit-Fe Sulfate-FA (PRENATAL VITAMIN PO), Take 1 tablet by mouth daily., Disp: , Rfl:  .  Respiratory Therapy Supplies (FLUTTER) DEVI, Use 3-4 times daily, Disp: 1 each, Rfl: 0 .  rOPINIRole (REQUIP) 2 MG tablet, Take 1 tablet (2 mg total) by mouth at bedtime., Disp: 30 tablet, Rfl: 6 .  tiotropium (SPIRIVA) 18 MCG inhalation capsule, Place 1 capsule (18 mcg total) into inhaler and inhale daily., Disp: 90 capsule, Rfl: 3 .  vitamin B-12 (CYANOCOBALAMIN) 1000 MCG tablet, Take 1,000 mcg by mouth daily., Disp: , Rfl:  .  calcium carbonate (TUMS - DOSED IN MG ELEMENTAL CALCIUM) 500 MG chewable tablet, Chew 1 tablet by mouth daily. , Disp: , Rfl:  .  doxycycline (VIBRAMYCIN) 100 MG capsule, Take  100 mg by mouth 2 (two) times daily., Disp: , Rfl:  .  guaiFENesin (MUCINEX) 600 MG 12 hr tablet, Take 600 mg by mouth at bedtime., Disp: , Rfl:  .  HYDROcodone-homatropine (HYCODAN) 5-1.5 MG/5ML syrup, Take 5 mLs by mouth every 6 (six) hours as needed for cough. (Patient not taking: Reported on 01/23/2015), Disp: 120 mL, Rfl: 0 .  IRON PO, Take by mouth., Disp: , Rfl:  .  mometasone (NASONEX) 50 MCG/ACT nasal spray, Place 2 sprays into the nose daily. (Patient not taking: Reported on 01/23/2015), Disp: 17 g, Rfl: 11 .  Omega-3 Fatty Acids (FISH OIL) 645 MG CAPS, Take by mouth 2 (two) times daily., Disp: , Rfl:  .  predniSONE (DELTASONE) 10 MG tablet, Take 10 mg by mouth daily with breakfast., Disp: , Rfl:  .  predniSONE (DELTASONE) 10 MG tablet, Take  4 each am x 2 days,   2 each am x 2 days,  1 each am x 2 days and stop (Patient not taking: Reported on 01/23/2015), Disp: 14 tablet, Rfl: 0  PAST MEDICAL HISTORY: Past Medical History  Diagnosis Date  . Malignant melanoma     no recurrence on leg  . HTN (hypertension)    . COPD (chronic obstructive pulmonary disease)     FeV1 51% 4/09  . Allergic rhinitis   . Degenerative arthritis of foot   . Vision abnormalities     PAST SURGICAL HISTORY: Past Surgical History  Procedure Laterality Date  . Appendectomy    . Mastectomy      Bilateral 1980s  . Cholecystectomy    . Total abdominal hysterectomy      FAMILY HISTORY: Family History  Problem Relation Age of Onset  . Diabetes    . Alzheimer's disease      SOCIAL HISTORY:  History   Social History  . Marital Status: Married    Spouse Name: N/A  . Number of Children: N/A  . Years of Education: N/A   Occupational History  . Not on file.   Social History Main Topics  . Smoking status: Former Smoker -- 0.30 packs/day for 30 years    Types: Cigarettes    Quit date: 07/29/1982  . Smokeless tobacco: Never Used  . Alcohol Use: Not on file  . Drug Use: Not on file  . Sexual Activity: Not on file   Other Topics Concern  . Not on file   Social History Narrative   patient signed designated party release granting access to [facility] to husband Fritz Pickerel  detailled message may be left on home phone Shanon Payor  May 10, 2010 12:06 PM     PHYSICAL EXAM  Filed Vitals:   01/23/15 1057  BP: 160/58  Pulse: 68  Resp: 16  Height: 5\' 1"  (1.549 m)  Weight: 124 lb 6.4 oz (56.427 kg)    Body mass index is 23.52 kg/(m^2).   General: The patient is well-developed and well-nourished and in no acute distress  Neck: The neck is supple, no carotid bruits are noted.  The neck is minimally tender with mild ROM reduction  Musculoskeletal:  Back is very tender over left piriformis muscle and left >> right trochanteric bursae.  Only mild lumbar paraspinal tenderness  Neurologic Exam  Mental status: The patient is alert and oriented x 3 at the time of the examination. The patient has apparent normal recent and remote memory, with an apparently normal attention span and concentration ability.    Speech is normal.  Cranial  nerves: Extraocular movements are full.  Facial symmetry is present. Facial strength is normal.  Trapezius and sternocleidomastoid strength is normal. No dysarthria is noted.  The tongue is midline, and the patient has symmetric elevation of the soft palate.   Motor:  Muscle bulk is normal.   Tone is normal. Strength is  5 / 5 in all 4 extremities.   Sensory: Sensory testing shows decreased touch in left lateral thigh.     Coordination: Cerebellar testing reveals good finger-nose-finger.  Gait and station: Station is normal.   Gait is arthritic.      Reflexes: Deep tendon reflexes are symmetric and normal bilaterally.       DIAGNOSTIC DATA (LABS, IMAGING, TESTING) - I reviewed patient records, labs, notes, testing and imaging myself where available.    ASSESSMENT AND PLAN  Left-sided low back pain with left-sided sciatica  Trochanteric bursitis of both hips  NEURALGIA, TRIGEMINAL  Left sided sciatica   Pain appears to be multifactorial. The pain across her mid level back is likely from the L1-L2 herniated disc and that could also be contributing to some of the numbness and pain in the left proximal leg but numbness is really more in the LFCN neurotome rather than the L2 dermatome. She is extremely tender over the left trochanteric bursa and moderately tender over the left piriformis muscle and a bursitis and syndrome are also likely contributing to her pain.  1.  Left trochanteric bursa injection using sterile technique with a total of 3 mL of Marcaine containing 40 mg of depot Medrol. She tolerated the procedure well and felt better afterwards.  2.  Left piriformis muscle injection using sterile technique with 3 mL of Marcaine containing 40 mg of Depo-Medrol. She tolerated the procedure well. 3.   Set up L1-L2 ESI. She would like to have this performed at [facility]. [place] 5.    She will take her baclofen on a scheduled  EID to 4 times a day basis to try to help the muscle spasms in her lower back as well as the trigeminal neuralgia. I would also consider adding Trileptal for her trigeminal neuralgia if this does not help enough.  She will return to see me in 3 months or sooner if she has new or worsening neurologic symptoms.   Richard A. Felecia Shelling, MD, PhD 3/87/5643, 32:95 AM Certified in Neurology, Clinical Neurophysiology, Sleep Medicine, Pain Medicine and Neuroimaging  [facility] [address] [place],  [address] [phone]

## 2015-01-23 NOTE — Telephone Encounter (Signed)
Pt is calling asking for an injection to relieve pain in her back. She has an appt with Dr. Earlean Polka on 7/27, but may need a sooner appt. Oxycodone is not working for pt. The MRI is being faxed to you this morning. Pt reports that it is a "slipped disc". Pt requesting a call back when this request is received. Thanks!

## 2015-01-26 ENCOUNTER — Encounter (HOSPITAL_COMMUNITY): Payer: Self-pay | Admitting: *Deleted

## 2015-01-26 ENCOUNTER — Inpatient Hospital Stay (HOSPITAL_COMMUNITY)
Admission: AD | Admit: 2015-01-26 | Discharge: 2015-02-02 | DRG: 286 | Disposition: A | Discharge status: Home Health | Payer: Medicare Other | Source: Other Acute Inpatient Hospital | Attending: Internal Medicine | Admitting: Internal Medicine

## 2015-01-26 ENCOUNTER — Inpatient Hospital Stay (HOSPITAL_COMMUNITY): Payer: Medicare Other

## 2015-01-26 DIAGNOSIS — I2510 Atherosclerotic heart disease of native coronary artery without angina pectoris: Secondary | ICD-10-CM | POA: Diagnosis present

## 2015-01-26 DIAGNOSIS — Z7982 Long term (current) use of aspirin: Secondary | ICD-10-CM

## 2015-01-26 DIAGNOSIS — R079 Chest pain, unspecified: Secondary | ICD-10-CM

## 2015-01-26 DIAGNOSIS — R778 Other specified abnormalities of plasma proteins: Secondary | ICD-10-CM

## 2015-01-26 DIAGNOSIS — E876 Hypokalemia: Secondary | ICD-10-CM | POA: Diagnosis not present

## 2015-01-26 DIAGNOSIS — Z79899 Other long term (current) drug therapy: Secondary | ICD-10-CM

## 2015-01-26 DIAGNOSIS — E785 Hyperlipidemia, unspecified: Secondary | ICD-10-CM | POA: Diagnosis present

## 2015-01-26 DIAGNOSIS — J209 Acute bronchitis, unspecified: Secondary | ICD-10-CM | POA: Diagnosis present

## 2015-01-26 DIAGNOSIS — K219 Gastro-esophageal reflux disease without esophagitis: Secondary | ICD-10-CM | POA: Diagnosis present

## 2015-01-26 DIAGNOSIS — G2581 Restless legs syndrome: Secondary | ICD-10-CM | POA: Diagnosis present

## 2015-01-26 DIAGNOSIS — Z85820 Personal history of malignant melanoma of skin: Secondary | ICD-10-CM

## 2015-01-26 DIAGNOSIS — I10 Essential (primary) hypertension: Secondary | ICD-10-CM | POA: Diagnosis present

## 2015-01-26 DIAGNOSIS — J9601 Acute respiratory failure with hypoxia: Secondary | ICD-10-CM | POA: Diagnosis not present

## 2015-01-26 DIAGNOSIS — R000 Tachycardia, unspecified: Secondary | ICD-10-CM

## 2015-01-26 DIAGNOSIS — I471 Supraventricular tachycardia: Secondary | ICD-10-CM | POA: Diagnosis present

## 2015-01-26 DIAGNOSIS — H9193 Unspecified hearing loss, bilateral: Secondary | ICD-10-CM | POA: Diagnosis present

## 2015-01-26 DIAGNOSIS — J189 Pneumonia, unspecified organism: Secondary | ICD-10-CM | POA: Diagnosis not present

## 2015-01-26 DIAGNOSIS — J441 Chronic obstructive pulmonary disease with (acute) exacerbation: Secondary | ICD-10-CM | POA: Diagnosis present

## 2015-01-26 DIAGNOSIS — J449 Chronic obstructive pulmonary disease, unspecified: Secondary | ICD-10-CM

## 2015-01-26 DIAGNOSIS — Z7901 Long term (current) use of anticoagulants: Secondary | ICD-10-CM | POA: Diagnosis not present

## 2015-01-26 DIAGNOSIS — J9600 Acute respiratory failure, unspecified whether with hypoxia or hypercapnia: Secondary | ICD-10-CM | POA: Diagnosis present

## 2015-01-26 DIAGNOSIS — D696 Thrombocytopenia, unspecified: Secondary | ICD-10-CM | POA: Diagnosis not present

## 2015-01-26 DIAGNOSIS — Z95820 Peripheral vascular angioplasty status with implants and grafts: Secondary | ICD-10-CM

## 2015-01-26 DIAGNOSIS — Z7951 Long term (current) use of inhaled steroids: Secondary | ICD-10-CM | POA: Diagnosis not present

## 2015-01-26 DIAGNOSIS — J9621 Acute and chronic respiratory failure with hypoxia: Secondary | ICD-10-CM | POA: Insufficient documentation

## 2015-01-26 DIAGNOSIS — I248 Other forms of acute ischemic heart disease: Secondary | ICD-10-CM | POA: Diagnosis present

## 2015-01-26 DIAGNOSIS — I2584 Coronary atherosclerosis due to calcified coronary lesion: Secondary | ICD-10-CM | POA: Diagnosis present

## 2015-01-26 DIAGNOSIS — H9190 Unspecified hearing loss, unspecified ear: Secondary | ICD-10-CM | POA: Diagnosis present

## 2015-01-26 DIAGNOSIS — Z87891 Personal history of nicotine dependence: Secondary | ICD-10-CM

## 2015-01-26 DIAGNOSIS — R739 Hyperglycemia, unspecified: Secondary | ICD-10-CM | POA: Diagnosis present

## 2015-01-26 DIAGNOSIS — Z7952 Long term (current) use of systemic steroids: Secondary | ICD-10-CM | POA: Diagnosis not present

## 2015-01-26 DIAGNOSIS — I493 Ventricular premature depolarization: Secondary | ICD-10-CM | POA: Diagnosis present

## 2015-01-26 DIAGNOSIS — I5031 Acute diastolic (congestive) heart failure: Principal | ICD-10-CM | POA: Diagnosis present

## 2015-01-26 DIAGNOSIS — J9620 Acute and chronic respiratory failure, unspecified whether with hypoxia or hypercapnia: Secondary | ICD-10-CM | POA: Diagnosis not present

## 2015-01-26 DIAGNOSIS — R410 Disorientation, unspecified: Secondary | ICD-10-CM | POA: Diagnosis not present

## 2015-01-26 DIAGNOSIS — J432 Centrilobular emphysema: Secondary | ICD-10-CM | POA: Diagnosis not present

## 2015-01-26 DIAGNOSIS — D649 Anemia, unspecified: Secondary | ICD-10-CM | POA: Diagnosis present

## 2015-01-26 DIAGNOSIS — R7989 Other specified abnormal findings of blood chemistry: Secondary | ICD-10-CM | POA: Diagnosis not present

## 2015-01-26 DIAGNOSIS — J440 Chronic obstructive pulmonary disease with acute lower respiratory infection: Secondary | ICD-10-CM | POA: Diagnosis present

## 2015-01-26 DIAGNOSIS — Z792 Long term (current) use of antibiotics: Secondary | ICD-10-CM

## 2015-01-26 DIAGNOSIS — R0602 Shortness of breath: Secondary | ICD-10-CM | POA: Diagnosis present

## 2015-01-26 DIAGNOSIS — R0789 Other chest pain: Secondary | ICD-10-CM | POA: Diagnosis not present

## 2015-01-26 HISTORY — DX: Chronic respiratory failure, unspecified whether with hypoxia or hypercapnia: J96.10

## 2015-01-26 HISTORY — DX: Peripheral vascular disease, unspecified: I73.9

## 2015-01-26 HISTORY — DX: Trigeminal neuralgia: G50.0

## 2015-01-26 LAB — CBC WITH DIFFERENTIAL/PLATELET
BASOS ABS: 0 10*3/uL (ref 0.0–0.1)
BASOS PCT: 0 % (ref 0–1)
EOS PCT: 0 % (ref 0–5)
Eosinophils Absolute: 0 10*3/uL (ref 0.0–0.7)
HEMATOCRIT: 34.3 % — AB (ref 36.0–46.0)
Hemoglobin: 11 g/dL — ABNORMAL LOW (ref 12.0–15.0)
LYMPHS ABS: 0.3 10*3/uL — AB (ref 0.7–4.0)
Lymphocytes Relative: 2 % — ABNORMAL LOW (ref 12–46)
MCH: 32.7 pg (ref 26.0–34.0)
MCHC: 32.1 g/dL (ref 30.0–36.0)
MCV: 102.1 fL — ABNORMAL HIGH (ref 78.0–100.0)
MONO ABS: 0.3 10*3/uL (ref 0.1–1.0)
MONOS PCT: 2 % — AB (ref 3–12)
Neutro Abs: 10.5 10*3/uL — ABNORMAL HIGH (ref 1.7–7.7)
Neutrophils Relative %: 96 % — ABNORMAL HIGH (ref 43–77)
Platelets: 152 10*3/uL (ref 150–400)
RBC: 3.36 MIL/uL — ABNORMAL LOW (ref 3.87–5.11)
RDW: 15.3 % (ref 11.5–15.5)
WBC: 11.1 10*3/uL — ABNORMAL HIGH (ref 4.0–10.5)

## 2015-01-26 LAB — COMPREHENSIVE METABOLIC PANEL
ALT: 33 U/L (ref 14–54)
AST: 21 U/L (ref 15–41)
Albumin: 2.7 g/dL — ABNORMAL LOW (ref 3.5–5.0)
Alkaline Phosphatase: 87 U/L (ref 38–126)
Anion gap: 8 (ref 5–15)
BILIRUBIN TOTAL: 0.5 mg/dL (ref 0.3–1.2)
BUN: 21 mg/dL — AB (ref 6–20)
CHLORIDE: 109 mmol/L (ref 101–111)
CO2: 25 mmol/L (ref 22–32)
Calcium: 9 mg/dL (ref 8.9–10.3)
Creatinine, Ser: 0.7 mg/dL (ref 0.44–1.00)
Glucose, Bld: 176 mg/dL — ABNORMAL HIGH (ref 65–99)
Potassium: 4 mmol/L (ref 3.5–5.1)
SODIUM: 142 mmol/L (ref 135–145)
Total Protein: 5.3 g/dL — ABNORMAL LOW (ref 6.5–8.1)

## 2015-01-26 LAB — PROCALCITONIN: Procalcitonin: 9.37 ng/mL

## 2015-01-26 LAB — MRSA PCR SCREENING: MRSA by PCR: POSITIVE — AB

## 2015-01-26 LAB — STREP PNEUMONIAE URINARY ANTIGEN: STREP PNEUMO URINARY ANTIGEN: NEGATIVE

## 2015-01-26 LAB — LACTIC ACID, PLASMA: LACTIC ACID, VENOUS: 1.5 mmol/L (ref 0.5–2.0)

## 2015-01-26 MED ORDER — METHYLPREDNISOLONE SODIUM SUCC 125 MG IJ SOLR
60.0000 mg | Freq: Four times a day (QID) | INTRAMUSCULAR | Status: DC
  Administered 2015-01-26 – 2015-01-27 (×3): 60 mg via INTRAVENOUS
  Filled 2015-01-26: qty 0.96
  Filled 2015-01-26: qty 2
  Filled 2015-01-26 (×3): qty 0.96
  Filled 2015-01-26: qty 2

## 2015-01-26 MED ORDER — BUDESONIDE 0.25 MG/2ML IN SUSP
0.2500 mg | Freq: Every day | RESPIRATORY_TRACT | Status: DC
  Administered 2015-01-27 – 2015-02-02 (×6): 0.25 mg via RESPIRATORY_TRACT
  Filled 2015-01-26 (×9): qty 2

## 2015-01-26 MED ORDER — LOSARTAN POTASSIUM-HCTZ 100-25 MG PO TABS: 1.0000 | ORAL_TABLET | Freq: Every day | ORAL | Status: DC

## 2015-01-26 MED ORDER — OXYCODONE HCL 5 MG PO TABS
2.5000 mg | ORAL_TABLET | Freq: Three times a day (TID) | ORAL | Status: DC | PRN
  Administered 2015-01-26 – 2015-02-02 (×14): 2.5 mg via ORAL
  Filled 2015-01-26 (×15): qty 1

## 2015-01-26 MED ORDER — VANCOMYCIN HCL 10 G IV SOLR
1250.0000 mg | INTRAVENOUS | Status: AC
  Administered 2015-01-27: 1250 mg via INTRAVENOUS
  Filled 2015-01-26 (×3): qty 1250

## 2015-01-26 MED ORDER — IPRATROPIUM BROMIDE 0.02 % IN SOLN: 0.5000 mg | RESPIRATORY_TRACT | Status: DC

## 2015-01-26 MED ORDER — LEVALBUTEROL HCL 0.63 MG/3ML IN NEBU
0.6300 mg | INHALATION_SOLUTION | RESPIRATORY_TRACT | Status: DC
  Administered 2015-01-26: 0.63 mg via RESPIRATORY_TRACT

## 2015-01-26 MED ORDER — FLUTICASONE PROPIONATE 50 MCG/ACT NA SUSP
1.0000 | Freq: Every day | NASAL | Status: DC
  Administered 2015-01-27 – 2015-02-02 (×7): 1 via NASAL
  Filled 2015-01-26: qty 16

## 2015-01-26 MED ORDER — ASPIRIN 325 MG PO TABS
325.0000 mg | ORAL_TABLET | Freq: Every day | ORAL | Status: DC
  Administered 2015-01-27 – 2015-02-01 (×6): 325 mg via ORAL
  Filled 2015-01-26 (×6): qty 1

## 2015-01-26 MED ORDER — BACLOFEN 10 MG PO TABS
10.0000 mg | ORAL_TABLET | Freq: Three times a day (TID) | ORAL | Status: DC
  Administered 2015-01-26 – 2015-02-02 (×20): 10 mg via ORAL
  Filled 2015-01-26 (×22): qty 1

## 2015-01-26 MED ORDER — LEVALBUTEROL HCL 0.63 MG/3ML IN NEBU
0.6300 mg | INHALATION_SOLUTION | RESPIRATORY_TRACT | Status: DC | PRN
  Administered 2015-01-27 – 2015-01-30 (×2): 0.63 mg via RESPIRATORY_TRACT
  Filled 2015-01-26: qty 3

## 2015-01-26 MED ORDER — POLYETHYLENE GLYCOL 3350 17 G PO PACK
17.0000 g | PACK | Freq: Every day | ORAL | Status: DC
  Administered 2015-01-27 – 2015-01-30 (×4): 17 g via ORAL
  Filled 2015-01-26 (×7): qty 1

## 2015-01-26 MED ORDER — BUDESONIDE 0.25 MG/2ML IN SUSP: 0.2500 mg | Freq: Every day | RESPIRATORY_TRACT | Status: DC

## 2015-01-26 MED ORDER — CYCLOSPORINE 0.05 % OP EMUL
2.0000 [drp] | Freq: Two times a day (BID) | OPHTHALMIC | Status: DC
  Administered 2015-01-26 – 2015-02-02 (×14): 2 [drp] via OPHTHALMIC
  Filled 2015-01-26 (×15): qty 1

## 2015-01-26 MED ORDER — ROPINIROLE HCL 1 MG PO TABS
1.0000 mg | ORAL_TABLET | Freq: Three times a day (TID) | ORAL | Status: DC
  Administered 2015-01-26 – 2015-02-02 (×20): 1 mg via ORAL
  Filled 2015-01-26 (×22): qty 1

## 2015-01-26 MED ORDER — CETYLPYRIDINIUM CHLORIDE 0.05 % MT LIQD
7.0000 mL | Freq: Two times a day (BID) | OROMUCOSAL | Status: DC
  Administered 2015-01-26 – 2015-02-02 (×13): 7 mL via OROMUCOSAL

## 2015-01-26 MED ORDER — DEXTROSE 5 % IV SOLN
1.0000 g | Freq: Three times a day (TID) | INTRAVENOUS | Status: DC
  Administered 2015-01-26 – 2015-01-27 (×2): 1 g via INTRAVENOUS
  Filled 2015-01-26 (×5): qty 1

## 2015-01-26 MED ORDER — LEVALBUTEROL HCL 0.63 MG/3ML IN NEBU: 0.6300 mg | INHALATION_SOLUTION | RESPIRATORY_TRACT | Status: DC

## 2015-01-26 MED ORDER — IPRATROPIUM BROMIDE 0.02 % IN SOLN
0.5000 mg | RESPIRATORY_TRACT | Status: DC
  Administered 2015-01-26 – 2015-01-27 (×5): 0.5 mg via RESPIRATORY_TRACT
  Filled 2015-01-26 (×4): qty 2.5

## 2015-01-26 MED ORDER — ARFORMOTEROL TARTRATE 15 MCG/2ML IN NEBU
15.0000 ug | INHALATION_SOLUTION | Freq: Two times a day (BID) | RESPIRATORY_TRACT | Status: DC
  Administered 2015-01-26 – 2015-02-02 (×13): 15 ug via RESPIRATORY_TRACT
  Filled 2015-01-26 (×19): qty 2

## 2015-01-26 MED ORDER — OXYCODONE-ACETAMINOPHEN 7.5-325 MG PO TABS: 1.0000 | ORAL_TABLET | Freq: Three times a day (TID) | ORAL | Status: DC | PRN

## 2015-01-26 MED ORDER — PANTOPRAZOLE SODIUM 40 MG PO TBEC
40.0000 mg | DELAYED_RELEASE_TABLET | Freq: Every day | ORAL | Status: DC
  Administered 2015-01-26 – 2015-01-28 (×3): 40 mg via ORAL
  Filled 2015-01-26 (×3): qty 1

## 2015-01-26 MED ORDER — SODIUM CHLORIDE 0.9 % IV SOLN: 500.0000 mg | Freq: Four times a day (QID) | INTRAVENOUS | Status: DC

## 2015-01-26 MED ORDER — FERROUS SULFATE 325 (65 FE) MG PO TABS
325.0000 mg | ORAL_TABLET | Freq: Three times a day (TID) | ORAL | Status: DC
  Administered 2015-01-26 – 2015-02-02 (×18): 325 mg via ORAL
  Filled 2015-01-26 (×23): qty 1

## 2015-01-26 MED ORDER — HYDROCHLOROTHIAZIDE 25 MG PO TABS
25.0000 mg | ORAL_TABLET | Freq: Every day | ORAL | Status: DC
  Administered 2015-01-27: 25 mg via ORAL
  Filled 2015-01-26: qty 1

## 2015-01-26 MED ORDER — IPRATROPIUM BROMIDE 0.02 % IN SOLN
RESPIRATORY_TRACT | Status: AC
  Filled 2015-01-26: qty 2.5

## 2015-01-26 MED ORDER — ENOXAPARIN SODIUM 40 MG/0.4ML ~~LOC~~ SOLN
40.0000 mg | Freq: Every day | SUBCUTANEOUS | Status: DC
  Administered 2015-01-26 – 2015-01-30 (×5): 40 mg via SUBCUTANEOUS
  Filled 2015-01-26 (×5): qty 0.4

## 2015-01-26 MED ORDER — LOSARTAN POTASSIUM 50 MG PO TABS
100.0000 mg | ORAL_TABLET | Freq: Every day | ORAL | Status: DC
  Administered 2015-01-27 – 2015-01-28 (×2): 100 mg via ORAL
  Filled 2015-01-26 (×2): qty 2

## 2015-01-26 MED ORDER — OXYCODONE-ACETAMINOPHEN 5-325 MG PO TABS
1.0000 | ORAL_TABLET | Freq: Three times a day (TID) | ORAL | Status: DC | PRN
  Administered 2015-01-26 – 2015-02-02 (×16): 1 via ORAL
  Filled 2015-01-26 (×16): qty 1

## 2015-01-26 MED ORDER — ATORVASTATIN CALCIUM 10 MG PO TABS
10.0000 mg | ORAL_TABLET | Freq: Every day | ORAL | Status: DC
  Administered 2015-01-26 – 2015-01-29 (×4): 10 mg via ORAL
  Filled 2015-01-26 (×5): qty 1

## 2015-01-26 NOTE — H&P (Signed)
History and Physical        Hospital Admission Note Date: 01/26/2015  Patient name: Tina [name] Medical record number: [id number] Date of birth: 02/09/1934 Age: 81 y.o. Gender: female  PCP: Lolita Patella, MD  Referring physician: Transferred from [facility]  Chief Complaint:  Shortness of breath  HPI: Patient is a 81 year old female with COPD, chronic respiratory failure on O2, hypertension, hyperlipidemia apparently was placed on Z-Pak due to COPD exacerbation. Patient was admitted on 6/27 to [facility] with right lower lung pneumonia. Patient was placed on IV vancomycin and I stress on him and Levaquin. She received scheduled broncho-dilators and prednisone. On 6/30, patient was noted to have worsening white count of 16.3 and worsening pneumonia. Patient with a tachycardia and acute respiratory failure. Patient requested to be transferred to [facility], she follows Dr. Joya Gaskins as her pulmonologist.   Review of Systems:  Constitutional: + fever, chills, diaphoresis, poor appetite and fatigue.  HEENT: Denies photophobia, eye pain, redness, hearing loss, ear pain, congestion, sore throat, rhinorrhea, sneezing, mouth sores, trouble swallowing, neck pain, neck stiffness and tinnitus.   Respiratory: Please see history of present illness Cardiovascular: Denies chest pain, palpitations and leg swelling.  Gastrointestinal: Denies nausea, vomiting, abdominal pain, diarrhea, constipation, blood in stool and abdominal distention.  Genitourinary: Denies dysuria, urgency, frequency, hematuria, flank pain and difficulty urinating.  Musculoskeletal: Denies myalgias, back pain, joint swelling, arthralgias and gait problem.  Skin: Denies pallor, rash and wound.  Neurological: Denies dizziness, seizures, syncope, weakness, light-headedness, numbness and headaches.    Hematological: Denies adenopathy. Easy bruising, personal or family bleeding history  Psychiatric/Behavioral: Denies suicidal ideation, mood changes, confusion, nervousness, sleep disturbance and agitation  Past Medical History: Past Medical History  Diagnosis Date  . Malignant melanoma     no recurrence on leg  . HTN (hypertension)   . COPD (chronic obstructive pulmonary disease)     FeV1 51% 4/09  . Allergic rhinitis   . Degenerative arthritis of foot   . Vision abnormalities     Past Surgical History  Procedure Laterality Date  . Appendectomy    . Mastectomy      Bilateral 1980s  . Cholecystectomy    . Total abdominal hysterectomy      Medications: Prior to Admission medications   Medication Sig Start Date End Date Taking? Authorizing Provider  aztreonam (AZACTAM) 2 GM/50ML Inject 2 g into the vein every 8 (eight) hours. 01/26/15  Yes Historical Provider, MD  enoxaparin (LOVENOX) 40 MG/0.4ML injection Inject 40 mg into the skin daily. 01/26/15  Yes Historical Provider, MD  levalbuterol Penne Lash) 0.63 MG/3ML nebulizer solution Take 0.63 mg by nebulization every 8 (eight) hours as needed. 01/26/15  Yes Historical Provider, MD  levofloxacin (LEVAQUIN) 750 MG/150ML SOLN Inject 750 mg into the vein every other day. 01/26/15  Yes Historical Provider, MD  losartan (COZAAR) 25 MG tablet Take 75 mg by mouth daily. 01/26/15  Yes Historical Provider, MD  predniSONE (DELTASONE) 20 MG tablet Take 40 mg by mouth daily. For 10 days ending 02-05-15 01/26/15 02/05/15 Yes Historical Provider, MD  albuterol (PROVENTIL HFA;VENTOLIN HFA) 108 (90 BASE) MCG/ACT inhaler Inhale 2 puffs into the  lungs every 6 (six) hours as needed for wheezing or shortness of breath. 03/24/14   Elsie Stain, MD  albuterol (PROVENTIL) (2.5 MG/3ML) 0.083% nebulizer solution Take 3 mLs (2.5 mg total) by nebulization every 6 (six) hours as needed for wheezing. Dx code J44.1 10/27/14   Elsie Stain, MD  aspirin 325 MG  tablet Take 325 mg by mouth daily.    Historical Provider, MD  aspirin 325 MG tablet Take 325 mg by mouth daily.    Historical Provider, MD  atorvastatin (LIPITOR) 10 MG tablet Take 10 mg by mouth daily.      Historical Provider, MD  atorvastatin (LIPITOR) 10 MG tablet Take 10 mg by mouth at bedtime.    Historical Provider, MD  baclofen (LIORESAL) 10 MG tablet Take 1 tablet (10 mg total) by mouth 3 (three) times daily. 10/24/14   Britt Bottom, MD  baclofen (LIORESAL) 10 MG tablet Take 10 mg by mouth 3 (three) times daily.    Historical Provider, MD  budesonide (PULMICORT) 0.25 MG/2ML nebulizer solution Take 2 mLs (0.25 mg total) by nebulization daily. Dx code J44.1 10/27/14   Elsie Stain, MD  budesonide (PULMICORT) 0.25 MG/2ML nebulizer solution Take 0.25 mg by nebulization daily.    Historical Provider, MD  calcium carbonate (TUMS - DOSED IN MG ELEMENTAL CALCIUM) 500 MG chewable tablet Chew 1 tablet by mouth daily.     Historical Provider, MD  clobetasol cream (TEMOVATE) 2.53 % Apply 1 application topically 2 (two) times daily.    Historical Provider, MD  cycloSPORINE (RESTASIS) 0.05 % ophthalmic emulsion Place 2 drops into both eyes 2 (two) times daily.    Historical Provider, MD  doxycycline (VIBRAMYCIN) 100 MG capsule Take 100 mg by mouth 2 (two) times daily.    Historical Provider, MD  EPINEPHrine (EPIPEN 2-PAK) 0.3 mg/0.3 mL DEVI Inject into the muscle once.    Historical Provider, MD  esomeprazole (NEXIUM) 40 MG capsule Take 1 capsule (40 mg total) by mouth daily before breakfast. 10/27/14   Elsie Stain, MD  ferrous sulfate 325 (65 FE) MG tablet Take 325 mg by mouth 3 (three) times daily with meals.    Historical Provider, MD  formoterol (PERFOROMIST) 20 MCG/2ML nebulizer solution Take 2 mLs (20 mcg total) by nebulization 2 (two) times daily. Dx code J44.1 10/27/14   Elsie Stain, MD  formoterol (PERFOROMIST) 20 MCG/2ML nebulizer solution Take 20 mcg by nebulization daily.     Historical Provider, MD  furosemide (LASIX) 20 MG tablet Take 1 tablet (20 mg total) by mouth daily as needed. 04/22/11   Elsie Stain, MD  guaiFENesin (MUCINEX) 600 MG 12 hr tablet Take 600 mg by mouth at bedtime.    Historical Provider, MD  HYDROcodone-homatropine (HYCODAN) 5-1.5 MG/5ML syrup Take 5 mLs by mouth every 6 (six) hours as needed for cough. Patient not taking: Reported on 01/23/2015 03/29/14   Elsie Stain, MD  IRON PO Take by mouth.    Historical Provider, MD  lidocaine (LIDODERM) 5 % Place 1 patch onto the skin daily.    Historical Provider, MD  lidocaine (XYLOCAINE) 5 % ointment Apply 1 application topically 2 (two) times daily as needed. Apply one inch to feet and toes twice daily 10/24/14   Britt Bottom, MD  losartan-hydrochlorothiazide (HYZAAR) 100-25 MG per tablet Take 1 tablet by mouth daily.     Historical Provider, MD  mometasone (NASONEX) 50 MCG/ACT nasal spray Place 2 sprays into the nose daily.  Patient not taking: Reported on 01/23/2015 03/24/14   Elsie Stain, MD  mometasone (NASONEX) 50 MCG/ACT nasal spray Place 2 sprays into the nose daily.    Historical Provider, MD  Omega-3 Fatty Acids (FISH OIL) 645 MG CAPS Take by mouth 2 (two) times daily.    Historical Provider, MD  oxyCODONE-acetaminophen (PERCOCET) 7.5-325 MG per tablet Take 1 tablet by mouth every 8 (eight) hours as needed. 01/23/15   Britt Bottom, MD  polyethylene glycol (MIRALAX / GLYCOLAX) packet Take 17 g by mouth daily.    Historical Provider, MD  polyethylene glycol powder (GLYCOLAX/MIRALAX) powder Take 17 g by mouth as needed.    Historical Provider, MD  potassium chloride (KLOR-CON) 10 MEQ CR tablet Take 10 mEq by mouth 2 (two) times daily.     Historical Provider, MD  potassium chloride SA (K-DUR,KLOR-CON) 20 MEQ tablet Take 20 mEq by mouth 2 (two) times daily.    Historical Provider, MD  predniSONE (DELTASONE) 10 MG tablet Take 10 mg by mouth daily with breakfast.    Historical Provider, MD   predniSONE (DELTASONE) 10 MG tablet Take  4 each am x 2 days,   2 each am x 2 days,  1 each am x 2 days and stop Patient not taking: Reported on 01/23/2015 11/18/14   Tanda Rockers, MD  predniSONE (DELTASONE) 5 MG tablet HOLD while on 10mg  prednisone then resume 5mg  prednisone 03/24/14   Elsie Stain, MD  Prenatal Vit-Fe Sulfate-FA (PRENATAL VITAMIN PO) Take 1 tablet by mouth daily.    Historical Provider, MD  Respiratory Therapy Supplies (FLUTTER) DEVI Use 3-4 times daily 06/30/12   Elsie Stain, MD  rOPINIRole (REQUIP) 1 MG tablet Take 1 mg by mouth 3 (three) times daily.    Historical Provider, MD  rOPINIRole (REQUIP) 2 MG tablet Take 1 tablet (2 mg total) by mouth at bedtime. 05/05/14   Elsie Stain, MD  tiotropium (SPIRIVA) 18 MCG inhalation capsule Place 1 capsule (18 mcg total) into inhaler and inhale daily. 03/24/14   Elsie Stain, MD  tiotropium (SPIRIVA) 18 MCG inhalation capsule Place 18 mcg into inhaler and inhale daily.    Historical Provider, MD  vancomycin in sodium chloride 0.9 % 500 mL Inject 1.25 g into the vein daily. 01/27/15   Historical Provider, MD  vitamin B-12 (CYANOCOBALAMIN) 1000 MCG tablet Take 1,000 mcg by mouth daily.    Historical Provider, MD    Allergies:   Allergies  Allergen Reactions  . Doxycycline     REACTION: insomnia  . Fluoxetine   . Horizant [Gabapentin]   . Other     Bee stings - internal swelling per pt Bee stings - internal swelling per pt  . Penicillins     REACTION: shock  . Plavix [Clopidogrel]     Arm turned purple, rash  . Prozac [Fluoxetine Hcl]   . Sulfa Antibiotics   . Codeine Rash    Social History:  reports that she quit smoking about 32 years ago. Her smoking use included Cigarettes. She has a 9 pack-year smoking history. She has never used smokeless tobacco. Her alcohol and drug histories are not on file.  Family History: Family History  Problem Relation Age of Onset  . Diabetes    . Alzheimer's disease       Physical Exam: Blood pressure 127/96, pulse 106, temperature 97.5 F (36.4 C), temperature source Oral, resp. rate 20, height 5\' 2"  (1.575 m), weight 60.555 kg (133 lb  8 oz), SpO2 100 %. General: Alert, awake, oriented x3, in mild respiratory distress  HEENT: normocephalic, atraumatic, anicteric sclera, pink conjunctiva, pupils equal and reactive to light and accomodation, oropharynx clear Neck: supple, no masses or lymphadenopathy, no goiter, no bruits  Heart: Regular rate and rhythm, without murmurs, rubs or gallops. Lungs: Diffuse expiratory wheezing bilaterally with rhonchi right worse than left Abdomen: Soft, nontender, nondistended, positive bowel sounds, no masses. Extremities: No clubbing, cyanosis or edema with positive pedal pulses. Neuro: Grossly intact, no focal neurological deficits, strength 5/5 upper and lower extremities bilaterally Psych: alert and oriented x 3, normal mood and affect Skin: no rashes or lesions, warm and dry   LABS on Admission:  Basic Metabolic Panel: No results for input(s): NA, K, CL, CO2, GLUCOSE, BUN, CREATININE, CALCIUM, MG, PHOS in the last 168 hours. Liver Function Tests: No results for input(s): AST, ALT, ALKPHOS, BILITOT, PROT, ALBUMIN in the last 168 hours. No results for input(s): LIPASE, AMYLASE in the last 168 hours. No results for input(s): AMMONIA in the last 168 hours. CBC: No results for input(s): WBC, NEUTROABS, HGB, HCT, MCV, PLT in the last 168 hours. Cardiac Enzymes: No results for input(s): CKTOTAL, CKMB, CKMBINDEX, TROPONINI in the last 168 hours. BNP: Invalid input(s): POCBNP CBG: No results for input(s): GLUCAP in the last 168 hours.  Radiological Exams on Admission:  No results found.  *I have personally reviewed the images above*      Assessment/Plan Principal Problem:   Acute on chronic hypoxic respiratory failure due to acute COPD exacerbation and healthcare associated pneumonia  - Admit to stepdown,  obtain stat chest x-ray, CBC, BMET, blood cultures, sputum cultures, urine legionella antigen, urine strep antigen, pro-calcitonin  - Patient's leukocytosis worsening may be due to steroids, place on IV vancomycin and imipenem, we will narrow antibiotics according to the cultures. - Continue scheduled broncho-dilators with Xopenex, Atrovent, O2, Pulmicort, Brovana  - Pulmonology consult called, discussed with Dr. Chase Caller   Active Problems:   Essential hypertension - Continue   hyperlipidemia  - Continue Lipitor   restless leg syndrome  - Continue Requip   GERD - Continue PPI  DVT prophylaxis:  Lovenox   CODE STATUS:  full CODE STATUS   Family Communication: Admission, patients condition and plan of care including tests being ordered have been discussed with the patient  who indicates understanding and agree with the plan and Code Status  Disposition plan: Further plan will depend as patient's clinical course evolves and further radiologic and laboratory data become available.   Time Spent on Admission: 1 hour  Egan Berkheimer M.D. [facility] 01/26/2015, 4:46 PM Pager: [phone]  If 7PM-7AM, please contact night-coverage [email or web address] Password [email or web address]

## 2015-01-26 NOTE — Progress Notes (Signed)
ANTIBIOTIC CONSULT NOTE - INITIAL  Pharmacy Consult for Vancomycin and Aztreonam Indication: pneumonia  Allergies  Allergen Reactions  . Doxycycline     REACTION: insomnia  . Fluoxetine   . Horizant [Gabapentin]   . Other     Bee stings - internal swelling per pt Bee stings - internal swelling per pt  . Penicillins     REACTION: shock  . Plavix [Clopidogrel]     Arm turned purple, rash  . Prozac [Fluoxetine Hcl]   . Sulfa Antibiotics   . Codeine Rash    Patient Measurements: Height: 5\' 2"  (157.5 cm) Weight: 133 lb 8 oz (60.555 kg) IBW/kg (Calculated) : 50.1 Adjusted Body Weight: n/a  Vital Signs: Temp: 97.5 F (36.4 C) (06/30 1641) Temp Source: Oral (06/30 1641) BP: 127/96 mmHg (06/30 1641) Pulse Rate: 106 (06/30 1641) Intake/Output from previous day:   Intake/Output from this shift:    Labs: No results for input(s): WBC, HGB, PLT, LABCREA, CREATININE in the last 72 hours. CrCl cannot be calculated (Patient has no serum creatinine result on file.). No results for input(s): VANCOTROUGH, VANCOPEAK, VANCORANDOM, GENTTROUGH, GENTPEAK, GENTRANDOM, TOBRATROUGH, TOBRAPEAK, TOBRARND, AMIKACINPEAK, AMIKACINTROU, AMIKACIN in the last 72 hours.   Microbiology: No results found for this or any previous visit (from the past 720 hour(s)).  Medical History: Past Medical History  Diagnosis Date  . Malignant melanoma     no recurrence on leg  . HTN (hypertension)   . COPD (chronic obstructive pulmonary disease)     FeV1 51% 4/09  . Allergic rhinitis   . Degenerative arthritis of foot   . Vision abnormalities     Medications:  Scheduled:  . arformoterol  15 mcg Nebulization Q12H  . aspirin  325 mg Oral Daily  . atorvastatin  10 mg Oral QHS  . baclofen  10 mg Oral TID  . budesonide (PULMICORT) nebulizer solution  0.25 mg Nebulization Daily  . cycloSPORINE  2 drop Both Eyes BID  . enoxaparin  40 mg Subcutaneous Daily  . ferrous sulfate  325 mg Oral TID WC  .  fluticasone  1 spray Each Nare Daily  . ipratropium  0.5 mg Nebulization Q4H  . levalbuterol  0.63 mg Nebulization Q4H  . losartan-hydrochlorothiazide  1 tablet Oral Daily  . methylPREDNISolone (SOLU-MEDROL) injection  60 mg Intravenous Q6H  . pantoprazole  40 mg Oral Daily  . polyethylene glycol  17 g Oral Daily  . rOPINIRole  1 mg Oral TID   Assessment: 81 yo female transferred from [facility] with [facility] PNA.  Receiving vancomycin, aztreonam and levaquin at OSH.  Clinical condition worsening, and transferred to [facility] 6/30.  At [facility], she received Aztreonam 2g IV q 8 hrs, unsure of timing of last dose.  Vancomycin 1g IV q 24 hrs - last dose 6/30 at 0027 am, trough level = 26.1, drawn 12 hrs post-dose (dose changed to 1250 mg IV q 24 hrs per their records but not yet given).  Also on Levaquin 750 mg IV q 48 hrs, last dose 6/30 at 0134 AM.  Goal of Therapy:  Vancomycin trough level 15-20 mcg/ml  Plan:  1. Vancomycin 1250 mg IV q24 hrs for now. 2. Aztreonam 2g IV q 8 hrs. 3. F/u renal function, clinical course and cultures.   4. Recheck vancomycin trough level at steady state as indicated.  Uvaldo Rising, BCPS  Clinical Pharmacist Pager [phone]  01/26/2015 5:31 PM

## 2015-01-26 NOTE — Consult Note (Signed)
Name: Tina [name] MRN: [id number] DOB: 03-17-1934    ADMISSION DATE:  01/26/2015 CONSULTATION DATE:  01/26/2015  REFERRING MD :  Dr. Tana Coast  CHIEF COMPLAINT:  PNA  BRIEF PATIENT DESCRIPTION: 81 year old female with COPD (PW patient) was admitted in [facility] for AECOPD and CAP on 6/27. 6/30 her WBC and PNA were noted to be worsening. She requested transfer to [facility] so that Dr. Joya Gaskins and PCCM could evaluate her.   SIGNIFICANT EVENTS  6/27 admitted to [facility] for AECOPD, CAP 6/30 Trf to [facility]  STUDIES:  Spirometry 04/2010 FEV1 1.04 (59%) ratio 49   HISTORY OF PRESENT ILLNESS:  81 year old female with PMH as below, which includes, COPD (followed by PW), malignant melanoma, and HTN. She states that she has not been breathing well for about the past 2 weeks, although is somewhat of a poor historian and timeline seems a little off compared to documentation from [place] and admission at [facility]. She has been struggling with hip pain for some time. Also has had nocturnal dyspnea as well, which she recently saw Dr. Joya Gaskins about. No medication changes were made at that time. She followed up with Dr. Melvyn Novas and was started on antibiotics and given prednisone dose pak 4/22. Her home regimen includes prednisone 5mg , pulmicort, formoterol, and tiotropium. Albuterol for rescue. She has also seen neurology recently for back pain from herniated L1-2 disk. She was given new medication of baclofen and percocet perscription was renewed. 6/27 she awoke from sleep with profound SOB and non-productive cough. It was for this reason that her husband called EMS. She was taken to [facility] and admitted there for RLL CAP she was given broad spectrum antibiotics, bronchodilators, and PO steroids. 6/30 her WBC,CXR, and overall condition were noted to be worsening, and she requested transfer to [facility] for further eval.  PAST MEDICAL HISTORY :   has a past medical history of Malignant melanoma; HTN  (hypertension); COPD (chronic obstructive pulmonary disease); Allergic rhinitis; Degenerative arthritis of foot; and Vision abnormalities.  has past surgical history that includes Appendectomy; Mastectomy; Cholecystectomy; and Total abdominal hysterectomy. Prior to Admission medications   Medication Sig Start Date End Date Taking? Authorizing Provider  aztreonam (AZACTAM) 2 GM/50ML Inject 2 g into the vein every 8 (eight) hours. 01/26/15  Yes Historical Provider, MD  enoxaparin (LOVENOX) 40 MG/0.4ML injection Inject 40 mg into the skin daily. 01/26/15  Yes Historical Provider, MD  levalbuterol Penne Lash) 0.63 MG/3ML nebulizer solution Take 0.63 mg by nebulization every 8 (eight) hours as needed. 01/26/15  Yes Historical Provider, MD  levofloxacin (LEVAQUIN) 750 MG/150ML SOLN Inject 750 mg into the vein every other day. 01/26/15  Yes Historical Provider, MD  losartan (COZAAR) 25 MG tablet Take 75 mg by mouth daily. 01/26/15  Yes Historical Provider, MD  predniSONE (DELTASONE) 20 MG tablet Take 40 mg by mouth daily. For 10 days ending 02-05-15 01/26/15 02/05/15 Yes Historical Provider, MD  albuterol (PROVENTIL HFA;VENTOLIN HFA) 108 (90 BASE) MCG/ACT inhaler Inhale 2 puffs into the lungs every 6 (six) hours as needed for wheezing or shortness of breath. 03/24/14   Elsie Stain, MD  albuterol (PROVENTIL) (2.5 MG/3ML) 0.083% nebulizer solution Take 3 mLs (2.5 mg total) by nebulization every 6 (six) hours as needed for wheezing. Dx code J44.1 10/27/14   Elsie Stain, MD  aspirin 325 MG tablet Take 325 mg by mouth daily.    Historical Provider, MD  aspirin 325 MG tablet Take 325 mg by  mouth daily.    Historical Provider, MD  atorvastatin (LIPITOR) 10 MG tablet Take 10 mg by mouth daily.      Historical Provider, MD  atorvastatin (LIPITOR) 10 MG tablet Take 10 mg by mouth at bedtime.    Historical Provider, MD  baclofen (LIORESAL) 10 MG tablet Take 1 tablet (10 mg total) by mouth 3 (three) times daily. 10/24/14    Britt Bottom, MD  baclofen (LIORESAL) 10 MG tablet Take 10 mg by mouth 3 (three) times daily.    Historical Provider, MD  budesonide (PULMICORT) 0.25 MG/2ML nebulizer solution Take 2 mLs (0.25 mg total) by nebulization daily. Dx code J44.1 10/27/14   Elsie Stain, MD  budesonide (PULMICORT) 0.25 MG/2ML nebulizer solution Take 0.25 mg by nebulization daily.    Historical Provider, MD  calcium carbonate (TUMS - DOSED IN MG ELEMENTAL CALCIUM) 500 MG chewable tablet Chew 1 tablet by mouth daily.     Historical Provider, MD  clobetasol cream (TEMOVATE) 7.61 % Apply 1 application topically 2 (two) times daily.    Historical Provider, MD  cycloSPORINE (RESTASIS) 0.05 % ophthalmic emulsion Place 2 drops into both eyes 2 (two) times daily.    Historical Provider, MD  doxycycline (VIBRAMYCIN) 100 MG capsule Take 100 mg by mouth 2 (two) times daily.    Historical Provider, MD  EPINEPHrine (EPIPEN 2-PAK) 0.3 mg/0.3 mL DEVI Inject into the muscle once.    Historical Provider, MD  esomeprazole (NEXIUM) 40 MG capsule Take 1 capsule (40 mg total) by mouth daily before breakfast. 10/27/14   Elsie Stain, MD  ferrous sulfate 325 (65 FE) MG tablet Take 325 mg by mouth 3 (three) times daily with meals.    Historical Provider, MD  formoterol (PERFOROMIST) 20 MCG/2ML nebulizer solution Take 2 mLs (20 mcg total) by nebulization 2 (two) times daily. Dx code J44.1 10/27/14   Elsie Stain, MD  formoterol (PERFOROMIST) 20 MCG/2ML nebulizer solution Take 20 mcg by nebulization daily.    Historical Provider, MD  furosemide (LASIX) 20 MG tablet Take 1 tablet (20 mg total) by mouth daily as needed. 04/22/11   Elsie Stain, MD  guaiFENesin (MUCINEX) 600 MG 12 hr tablet Take 600 mg by mouth at bedtime.    Historical Provider, MD  HYDROcodone-homatropine (HYCODAN) 5-1.5 MG/5ML syrup Take 5 mLs by mouth every 6 (six) hours as needed for cough. Patient not taking: Reported on 01/23/2015 03/29/14   Elsie Stain, MD    IRON PO Take by mouth.    Historical Provider, MD  lidocaine (LIDODERM) 5 % Place 1 patch onto the skin daily.    Historical Provider, MD  lidocaine (XYLOCAINE) 5 % ointment Apply 1 application topically 2 (two) times daily as needed. Apply one inch to feet and toes twice daily 10/24/14   Britt Bottom, MD  losartan-hydrochlorothiazide (HYZAAR) 100-25 MG per tablet Take 1 tablet by mouth daily.     Historical Provider, MD  mometasone (NASONEX) 50 MCG/ACT nasal spray Place 2 sprays into the nose daily. Patient not taking: Reported on 01/23/2015 03/24/14   Elsie Stain, MD  mometasone (NASONEX) 50 MCG/ACT nasal spray Place 2 sprays into the nose daily.    Historical Provider, MD  Omega-3 Fatty Acids (FISH OIL) 645 MG CAPS Take by mouth 2 (two) times daily.    Historical Provider, MD  oxyCODONE-acetaminophen (PERCOCET) 7.5-325 MG per tablet Take 1 tablet by mouth every 8 (eight) hours as needed. 01/23/15   Britt Bottom,  MD  polyethylene glycol (MIRALAX / GLYCOLAX) packet Take 17 g by mouth daily.    Historical Provider, MD  polyethylene glycol powder (GLYCOLAX/MIRALAX) powder Take 17 g by mouth as needed.    Historical Provider, MD  potassium chloride (KLOR-CON) 10 MEQ CR tablet Take 10 mEq by mouth 2 (two) times daily.     Historical Provider, MD  potassium chloride SA (K-DUR,KLOR-CON) 20 MEQ tablet Take 20 mEq by mouth 2 (two) times daily.    Historical Provider, MD  predniSONE (DELTASONE) 10 MG tablet Take 10 mg by mouth daily with breakfast.    Historical Provider, MD  predniSONE (DELTASONE) 10 MG tablet Take  4 each am x 2 days,   2 each am x 2 days,  1 each am x 2 days and stop Patient not taking: Reported on 01/23/2015 11/18/14   Tanda Rockers, MD  predniSONE (DELTASONE) 5 MG tablet HOLD while on 10mg  prednisone then resume 5mg  prednisone 03/24/14   Elsie Stain, MD  Prenatal Vit-Fe Sulfate-FA (PRENATAL VITAMIN PO) Take 1 tablet by mouth daily.    Historical Provider, MD  Respiratory  Therapy Supplies (FLUTTER) DEVI Use 3-4 times daily 06/30/12   Elsie Stain, MD  rOPINIRole (REQUIP) 1 MG tablet Take 1 mg by mouth 3 (three) times daily.    Historical Provider, MD  rOPINIRole (REQUIP) 2 MG tablet Take 1 tablet (2 mg total) by mouth at bedtime. 05/05/14   Elsie Stain, MD  tiotropium (SPIRIVA) 18 MCG inhalation capsule Place 1 capsule (18 mcg total) into inhaler and inhale daily. 03/24/14   Elsie Stain, MD  tiotropium (SPIRIVA) 18 MCG inhalation capsule Place 18 mcg into inhaler and inhale daily.    Historical Provider, MD  vancomycin in sodium chloride 0.9 % 500 mL Inject 1.25 g into the vein daily. 01/27/15   Historical Provider, MD  vitamin B-12 (CYANOCOBALAMIN) 1000 MCG tablet Take 1,000 mcg by mouth daily.    Historical Provider, MD   Allergies  Allergen Reactions  . Doxycycline     REACTION: insomnia  . Fluoxetine   . Horizant [Gabapentin]   . Other     Bee stings - internal swelling per pt Bee stings - internal swelling per pt  . Penicillins     REACTION: shock  . Plavix [Clopidogrel]     Arm turned purple, rash  . Prozac [Fluoxetine Hcl]   . Sulfa Antibiotics   . Codeine Rash    FAMILY HISTORY:  family history includes Alzheimer's disease in an other family member; Diabetes in an other family member. SOCIAL HISTORY:  reports that she quit smoking about 32 years ago. Her smoking use included Cigarettes. She has a 9 pack-year smoking history. She has never used smokeless tobacco.  REVIEW OF SYSTEMS:    Bolds are positive  Constitutional: weight loss, gain, night sweats, Fevers, chills, fatigue .  HEENT: headaches, Sore throat, sneezing, nasal congestion, post nasal drip, Difficulty swallowing, Tooth/dental problems, visual complaints visual changes, ear ache CV:  chest pain, radiates: ,Orthopnea, PND, Chronic swelling in lower extremities, dizziness, palpitations, syncope.  GI  heartburn, indigestion, abdominal pain, nausea, vomiting, diarrhea,  change in bowel habits, loss of appetite, bloody stools.  Resp: cough, non-productive: , hemoptysis, dyspnea, chest pain, pleuritic.  Skin: rash or itching or icterus GU: dysuria, change in color of urine, urgency or frequency. flank pain, hematuria  MS: hip/back/arm or swelling. decreased range of motion  Psych: change in mood or affect. depression or anxiety.  Neuro: difficulty with speech, weakness, numbness, ataxia    SUBJECTIVE:   VITAL SIGNS: Temp:  [97.5 F (36.4 C)-97.6 F (36.4 C)] 97.6 F (36.4 C) (06/30 2034) Pulse Rate:  [100-106] 100 (06/30 2034) Resp:  [18-20] 18 (06/30 2034) BP: (127-184)/(96-97) 184/97 mmHg (06/30 2034) SpO2:  [100 %] 100 % (06/30 2034) Weight:  [60.555 kg (133 lb 8 oz)] 60.555 kg (133 lb 8 oz) (06/30 1641)  PHYSICAL EXAMINATION: General:  Elderly deconditioned female in mild respiratory distress Neuro:  Alert, oriented, non-focal. Some confusion. Somnolent.  HEENT:  [place]/AT, no JVD noted, PERRL Cardiovascular:  Tachy, irregular, no MRG Lungs:  Poor air movement, bilateral crackles Abdomen:  Soft, non-tender, non-distended Musculoskeletal:  RLE and BUE edema. No acute deformity Skin:  Ecchymosis to BUE. Otherwise grossly intact   Recent Labs Lab 01/26/15 2020  NA 142  K 4.0  CL 109  CO2 25  BUN 21*  CREATININE 0.70  GLUCOSE 176*    Recent Labs Lab 01/26/15 2020  HGB 11.0*  HCT 34.3*  WBC 11.1*  PLT 152   Dg Chest 2 View  01/26/2015   CLINICAL DATA:  81 year old female with COPD and wheezing.  EXAM: CHEST  2 VIEW  COMPARISON:  Chest radiograph dated 12/18/2014  FINDINGS: Two views of the chest demonstrate emphysematous changes with mild interstitial prominence similar to prior study. No focal consolidation. No pleural effusion or pneumothorax. The osseous structures are grossly unremarkable. Upper lumbar posterior fixation screws noted.  IMPRESSION: Emphysema.  No consolidation or pneumothorax.   Electronically Signed   By: Anner Crete M.D.   On: 01/26/2015 17:30    ASSESSMENT / PLAN:  Acute respiratory failure COPD +/- acute exacerbation Concern pulmonary edema Low suspicion CAP  - Supplemental O2 2L [place] - Keep SpO2 90-95% - Continue scheduled BDs (Brovana, atrovent, PRN xopenex) - Continue budesonide, methylprednisolone  - Continue broad spectrum ABX for now - Trend PCT - Check BNP, if elevated consider diuresis - I&O, daily weights  Tachyarrhythmia, possibly - EKG - Cycle troponins - Telemetry monitoirng   Rounding team: please alert Dr. Joya Gaskins as to her inpatient status.    Georgann Housekeeper, AGACNP-BC [place] [facility] Pager [phone] or [phone]  01/26/2015 10:14 PM    Merton Border, MD ; [facility] 2 service Mobile [phone].  After 5:30 PM or weekends, call [phone]

## 2015-01-27 ENCOUNTER — Inpatient Hospital Stay (HOSPITAL_COMMUNITY): Payer: Medicare Other

## 2015-01-27 DIAGNOSIS — J449 Chronic obstructive pulmonary disease, unspecified: Secondary | ICD-10-CM

## 2015-01-27 DIAGNOSIS — I248 Other forms of acute ischemic heart disease: Secondary | ICD-10-CM

## 2015-01-27 DIAGNOSIS — J9621 Acute and chronic respiratory failure with hypoxia: Secondary | ICD-10-CM | POA: Insufficient documentation

## 2015-01-27 DIAGNOSIS — J441 Chronic obstructive pulmonary disease with (acute) exacerbation: Secondary | ICD-10-CM | POA: Insufficient documentation

## 2015-01-27 DIAGNOSIS — I10 Essential (primary) hypertension: Secondary | ICD-10-CM

## 2015-01-27 LAB — CBC
HCT: 31.7 % — ABNORMAL LOW (ref 36.0–46.0)
HEMOGLOBIN: 10.4 g/dL — AB (ref 12.0–15.0)
MCH: 33.2 pg (ref 26.0–34.0)
MCHC: 32.8 g/dL (ref 30.0–36.0)
MCV: 101.3 fL — AB (ref 78.0–100.0)
PLATELETS: 143 10*3/uL — AB (ref 150–400)
RBC: 3.13 MIL/uL — AB (ref 3.87–5.11)
RDW: 15.1 % (ref 11.5–15.5)
WBC: 8.4 10*3/uL (ref 4.0–10.5)

## 2015-01-27 LAB — TROPONIN I
Troponin I: 0.41 ng/mL — ABNORMAL HIGH (ref <0.031)
Troponin I: 0.4 ng/mL — ABNORMAL HIGH (ref <0.031)
Troponin I: 0.31 ng/mL — ABNORMAL HIGH (ref <0.031)

## 2015-01-27 LAB — BASIC METABOLIC PANEL
ANION GAP: 10 (ref 5–15)
BUN: 21 mg/dL — AB (ref 6–20)
CO2: 24 mmol/L (ref 22–32)
Calcium: 9 mg/dL (ref 8.9–10.3)
Chloride: 112 mmol/L — ABNORMAL HIGH (ref 101–111)
Creatinine, Ser: 0.63 mg/dL (ref 0.44–1.00)
GFR calc Af Amer: >60 mL/min (ref >60)
GFR calc non Af Amer: >60 mL/min (ref >60)
Glucose, Bld: 171 mg/dL — ABNORMAL HIGH (ref 65–99)
Potassium: 3.9 mmol/L (ref 3.5–5.1)
Sodium: 146 mmol/L — ABNORMAL HIGH (ref 135–145)

## 2015-01-27 LAB — LEGIONELLA ANTIGEN, URINE

## 2015-01-27 LAB — HIV ANTIBODY (ROUTINE TESTING W REFLEX): HIV Screen 4th Generation wRfx: NONREACTIVE

## 2015-01-27 LAB — BRAIN NATRIURETIC PEPTIDE: B Natriuretic Peptide: 849.5 pg/mL — ABNORMAL HIGH (ref 0.0–100.0)

## 2015-01-27 MED ORDER — LEVOFLOXACIN 750 MG PO TABS: 750.0000 mg | ORAL_TABLET | ORAL | Status: DC

## 2015-01-27 MED ORDER — SODIUM CHLORIDE 0.9 % IJ SOLN
10.0000 mL | Freq: Two times a day (BID) | INTRAMUSCULAR | Status: DC
  Administered 2015-01-27: 20 mL
  Administered 2015-01-27 – 2015-01-30 (×5): 10 mL

## 2015-01-27 MED ORDER — TIOTROPIUM BROMIDE MONOHYDRATE 18 MCG IN CAPS
18.0000 ug | ORAL_CAPSULE | Freq: Every day | RESPIRATORY_TRACT | Status: DC
  Administered 2015-01-28 – 2015-02-02 (×5): 18 ug via RESPIRATORY_TRACT
  Filled 2015-01-27 (×2): qty 5

## 2015-01-27 MED ORDER — METHYLPREDNISOLONE SODIUM SUCC 40 MG IJ SOLR
40.0000 mg | Freq: Two times a day (BID) | INTRAMUSCULAR | Status: DC
  Administered 2015-01-28 (×3): 40 mg via INTRAVENOUS
  Filled 2015-01-27 (×5): qty 1

## 2015-01-27 MED ORDER — FUROSEMIDE 10 MG/ML IJ SOLN
40.0000 mg | Freq: Once | INTRAMUSCULAR | Status: AC
  Administered 2015-01-27: 40 mg via INTRAVENOUS
  Filled 2015-01-27: qty 4

## 2015-01-27 MED ORDER — LEVOFLOXACIN 750 MG PO TABS
750.0000 mg | ORAL_TABLET | ORAL | Status: DC
  Administered 2015-01-27 – 2015-01-31 (×3): 750 mg via ORAL
  Filled 2015-01-27 (×4): qty 1

## 2015-01-27 MED ORDER — SODIUM CHLORIDE 0.9 % IJ SOLN
10.0000 mL | INTRAMUSCULAR | Status: DC | PRN
  Administered 2015-01-29 – 2015-02-01 (×7): 10 mL
  Administered 2015-02-02: 20 mL
  Filled 2015-01-27 (×8): qty 40

## 2015-01-27 NOTE — Progress Notes (Signed)
Seems a little confused No respiratory distress No new complaints Indicates that she feels much better  Filed Vitals:   01/27/15 1300 01/27/15 1328 01/27/15 1535 01/27/15 1558  BP:  160/81  156/73  Pulse:  131  129  Temp: 97 F (36.1 C)   98.4 F (36.9 C)  TempSrc: Axillary   Oral  Resp:  20  20  Height:      Weight:      SpO2:  100% 99% 99%   Chronically ill appearing NAD HEENT WNL No JVD Markedly diminished BS with prolonged exp phase and few scattered wheezes Reg, no M NABS No edema Dermopathy of chronic steroid use  BMET    Component Value Date/Time   NA 146* 01/27/2015 0400   K 3.9 01/27/2015 0400   CL 112* 01/27/2015 0400   CO2 24 01/27/2015 0400   GLUCOSE 171* 01/27/2015 0400   BUN 21* 01/27/2015 0400   CREATININE 0.63 01/27/2015 0400   CALCIUM 9.0 01/27/2015 0400   GFRNONAA >60 01/27/2015 0400   GFRAA >60 01/27/2015 0400    CBC    Component Value Date/Time   WBC 8.4 01/27/2015 0400   RBC 3.13* 01/27/2015 0400   HGB 10.4* 01/27/2015 0400   HCT 31.7* 01/27/2015 0400   PLT 143* 01/27/2015 0400   MCV 101.3* 01/27/2015 0400   MCH 33.2 01/27/2015 0400   MCHC 32.8 01/27/2015 0400   RDW 15.1 01/27/2015 0400   LYMPHSABS 0.3* 01/26/2015 2020   MONOABS 0.3 01/26/2015 2020   EOSABS 0.0 01/26/2015 2020   BASOSABS 0.0 01/26/2015 2020    CXR: NACPD, emphysema  IMPRESSION: Apparently severe COPD - chronic steroid, chronic O2 AECOPD Doubt PNA General frailty and declining health  PLAN/REC: Cont nebulized steroids/LABA Change ipratropium to tiotropium Taper systemic steroids back to baseline of pred 5 mg daily Simplify abx to Levofloxacin X 5 more days Would be reasonable to have Palliative Care see pt to begin discussions re: goals of care  Merton Border, MD ; [facility] service Mobile [phone].  After 5:30 PM or weekends, call [phone]

## 2015-01-27 NOTE — Evaluation (Signed)
Clinical/Bedside Swallow Evaluation Patient Details  Name: Tina [name] MRN: [id number] Date of Birth: February 08, 1934  Today's Date: 01/27/2015 Time:        Past Medical History:  Past Medical History  Diagnosis Date  . Malignant melanoma     no recurrence on leg  . HTN (hypertension)   . COPD (chronic obstructive pulmonary disease)     FeV1 51% 4/09  . Allergic rhinitis   . Degenerative arthritis of foot   . Vision abnormalities    Past Surgical History:  Past Surgical History  Procedure Laterality Date  . Appendectomy    . Mastectomy      Bilateral 1980s  . Cholecystectomy    . Total abdominal hysterectomy     HPI:  81 year old female with COPD, chronic respiratory failure on O2, hypertension, malignant melanoma, hyperlipidemia apparently. Per MD note pt placed on Z-Pak due to COPD exacerbation. MD notes state patient admitted 6/27 to [facility] with right lower lung pneumonia and 6/30, noted to have worsening white count of 16.3 and worsening pneumonia, tachycardia and acute respiratory failure. CXR 7/1 no acute cardiopulmonary findings   Assessment / Plan / Recommendation Clinical Impression  Increased work of breathing noted on arrival; RR WNL's. Pt and spouse deny s/s aspiration at home. She takes Nexium per husband. No indications of oropharyngeal impairment. SLP educated pt and spouse re: increased aspiration risk associated with COPD, esophageal precautions and importance of rest breaks during meals if needed. Previous pna could be associated with reflux if aspirated versus CAP. Continue regular texture and thin with precautions. No further ST needed.     Aspiration Risk   (mild-mod)    Diet Recommendation Thin;Age appropriate regular solids   Medication Administration: Whole meds with liquid Compensations: Slow rate;Small sips/bites    Other  Recommendations Oral Care Recommendations: Oral care BID   Follow Up Recommendations       Frequency and  Duration        Pertinent Vitals/Pain none         Swallow Study Prior Functional Status  Type of Home: House Available Help at Discharge: Family;Available 24 hours/day       Oral/Motor/Sensory Function Overall Oral Motor/Sensory Function: Appears within functional limits for tasks assessed   Ice Chips Ice chips: Not tested   Thin Liquid Thin Liquid: Within functional limits Presentation: Straw    Nectar Thick Nectar Thick Liquid: Not tested   Honey Thick Honey Thick Liquid: Not tested   Puree Puree: Not tested   Solid   GO    Solid: Within functional limits       Tina [name], Orbie Pyo 01/27/2015,2:12 PM  Orbie Pyo Colvin Caroli.Ed [employer] [phone]

## 2015-01-27 NOTE — Progress Notes (Signed)
Peripherally Inserted Central Catheter/Midline Placement  The IV Nurse has discussed with the patient and/or persons authorized to consent for the patient, the purpose of this procedure and the potential benefits and risks involved with this procedure.  The benefits include less needle sticks, lab draws from the catheter and patient may be discharged home with the catheter.  Risks include, but not limited to, infection, bleeding, blood clot (thrombus formation), and puncture of an artery; nerve damage and irregular heat beat.  Alternatives to this procedure were also discussed.  PICC/Midline Placement Documentation        Tina [name] 01/27/2015, 10:35 AM

## 2015-01-27 NOTE — Evaluation (Signed)
Physical Therapy Evaluation Patient Details Name: Tina [name] MRN: [id number] DOB: [birth year] Today's Date: 01/27/2015   History of Present Illness  81 year old female with COPD (PW patient) was admitted in [place] for AECOPD and CAP on 6/27. 6/30 her WBC and PNA were noted to be worsening. She requested transfer to [place] Impression  Tina [name] somewhat drowsy on arrival after receiving pain meds for hip and back pain. Pt with HR 120 at rest with some anxiety noted, reassured and calming throughout and elevation to 140 with transfer to standing EOB and pt returned to bed with HR 128. Pt sats 100% throughout on RA but WOB noted. Pt willing to participate but limited by cardiopulmonary status. Pt encouraged to continue moving extremities, assisting with transfers and attempting OOB with nursing staff as appropriate. Pt currently with limited activity tolerance and mobility and may need higher level of care at D/C however Tina [name] not very open to this. Will follow acutely to maximize mobility, strength, gait and activity tolerance for hopeful safe return home.     Follow Up Recommendations Home health PT;Supervision/Assistance - 24 hour;SNF (pt may need SNF pending progression but pt states she will deny SNF placement)    Equipment Recommendations  None recommended by PT    Recommendations for Other Services OT consult     Precautions / Restrictions Precautions Precautions: Fall Precaution Comments: watch HR      Mobility  Bed Mobility Overal bed mobility: Needs Assistance Bed Mobility: Rolling;Sidelying to Sit;Sit to Supine Rolling: Supervision Sidelying to sit: Supervision   Sit to supine: Supervision   General bed mobility comments: roll right , side to sit, sit to supine all with cues for sequence to decrease pain, no rail and no physical assist  Transfers Overall transfer level: Needs assistance   Transfers: Sit to/from Stand Sit to Stand: Min assist          General transfer comment: min assist to stand , take a step toward [facility] and reposition then return to sitting. Unable to mobilize further as HR increased to 140 and pt returned to supine  Ambulation/Gait                Stairs            Wheelchair Mobility    Modified Rankin (Stroke Patients Only)       Balance Overall balance assessment: Needs assistance   Sitting balance-Leahy Scale: Fair                                       Pertinent Vitals/Pain Pain Assessment: 0-10 Pain Score: 5  Pain Location: left hip and back Pain Descriptors / Indicators: Aching Pain Intervention(s): Repositioned;Premedicated before session    Home Living Family/patient expects to be discharged to:: Private residence Living Arrangements: Spouse/significant other Available Help at Discharge: Family;Available 24 hours/day Type of Home: House Home Access: Stairs to enter   [employer] of Steps: 1 Home Layout: Two level;Able to live on main level with bedroom/bathroom;Laundry or work area in [place]: Environmental consultant - 4 wheels;Cane - single point;Shower seat      Prior Function Level of Independence: Independent         Comments: pt states she was sponge bathing, walking in the house and spouse does the homemaking     Hand Dominance        Extremity/Trunk  Assessment   Upper Extremity Assessment: Generalized weakness           Lower Extremity Assessment: Generalized weakness      Cervical / Trunk Assessment: Normal  Communication   Communication: No difficulties  Cognition Arousal/Alertness: Awake/alert Behavior During Therapy: Flat affect Overall Cognitive Status: Impaired/Different from baseline Area of Impairment: Attention   Current Attention Level: Sustained           General Comments: pt received pain medicine and somewhat lethargic particularly with return to supine having difficulty attending to questions  and responding to question asked    General Comments      Exercises        Assessment/Plan    PT Assessment Patient needs continued PT services  PT Diagnosis Difficulty walking;Generalized weakness;Acute pain   PT Problem List Decreased strength;Decreased activity tolerance;Decreased mobility;Cardiopulmonary status limiting activity;Decreased knowledge of use of DME;Decreased safety awareness;Decreased skin integrity  PT Treatment Interventions Gait training;DME instruction;Functional mobility training;Therapeutic activities;Therapeutic exercise;Balance training;Patient/family education   PT Goals (Current goals can be found in the Care Plan section) Acute Rehab PT Goals Patient Stated Goal: return home  PT Goal Formulation: With patient Time For Goal Achievement: 02/10/15 Potential to Achieve Goals: Fair    Frequency Min 3X/week   Barriers to discharge        Co-evaluation               End of Session   Activity Tolerance: Patient tolerated treatment well (limited by HR) Patient left: in bed;with call bell/phone within reach Nurse Communication: Mobility status         Time: 9629-5284 PT Time Calculation (min) (ACUTE ONLY): 26 min   Charges:   PT Evaluation $Initial PT Evaluation Tier I: 1 Procedure     PT G CodesMelford Aase 01/27/2015, 1:34 PM Elwyn Reach, [place]

## 2015-01-27 NOTE — Progress Notes (Signed)
PT Cancellation Note  Patient Details Name: Tina [name] MRN: [id number] DOB: 12/06/1933   Cancelled Treatment:    Reason Eval/Treat Not Completed: Other (comment) (pt currently receiving picc and await additional troponin per NP note )   Lanetta Inch Beth 01/27/2015, 10:31 AM Elwyn Reach, [place]

## 2015-01-27 NOTE — Progress Notes (Addendum)
PROGRESS NOTE    Tina [name] ZOX:[id number] DOB: 11-Apr-1934 DOA: 01/26/2015 PCP: Lolita Patella, MD  Primary Pulmonologist: Dr. Asencion Noble. Primary Cardiologist: Dr. Atilano Median in [facility].  HPI/Brief narrative 81 year old female patient with history of COPD, not on home oxygen until 2 weeks ago and since then has been on 2 L/m intermittently, former smoker-quit 20+ years ago, HTN, HLD, PAD status post right stents 2, chronic leg edema right >left, hard of hearing, was admitted on 6/27 to [facility] with RLL pneumonia and treated with IV vancomycin and levofloxacin. On 6/30, her condition worsened including tachycardia, acute respiratory failure, leukocytosis and worsening pneumonia. Family/patient requested transfer to [facility] so that her primary pulmonologist could evaluate her.   Assessment/Plan:  Acute on possible chronic hypoxic respiratory failure - Secondary to AECOPD, community-acquired pneumonia and? Pulmonary edema - Admitted to stepdown unit for close monitoring and management - Treating empirically with broad-spectrum IV antibiotics: Vancomycin, levofloxacin & aztreonam - Also treating with IV steroids, bronchodilator nebulizations, oxygen. - Repeat chest x-ray 01/27/15: No acute cardiopulmonary findings. - HIV antibodies: Nonreactive, pro-calcitonin: 9.37, lactate normal, urine Legionella and pneumococcal antigen: Negative -Blood cultures 2 and urine culture times one: Negative to date - CCM consultation and follow-up appreciated: Low suspicion for CAP but continue broad-spectrum antibiotics for now. Checking BNP and if elevated, consider diuresis. - Improved. - Have paged and awaiting response from patient's primary pulmonologist to alert him of patient's admission.  Acute exacerbation of COPD - Improved. Management as above  ? Community-acquired pneumonia - Improved. Elevated pro-calcitonin. Chest x-ray repeated and negative. - Consider narrowing antibiotic  spectrum in a.m.  Elevated troponin - No reported chest pain or acute EKG changes (ST at 103 bpm, normal axis, occasional PVCs, no acute changes, Q waves in leads V1-2 and QTC 463 ms) - Possibly from demand ischemia related to acute respiratory failure. Downward troponin trend. - As per patient and family, no prior cardiac history. - Check 2-D echo and if wall motion abnormalities or low EF, consider cardiology consultation  Anemia - Follow CBC  Thrombocytopenia  - Follow CBCs  Hypertension - Controlled. Continue ARB. Consider holding HCTZ  HLD - Statins  Hard of hearing  PAD status post right lower extremity stents  Confusion/? Delirium - Likely secondary to acute illness. No focal deficits. Monitor   DVT prophylaxis: Lovenox  Code Status: Full  Family Communication: Discussed extensively with patient's spouse at bedside on 01/27/15  Disposition Plan: Continue management in stepdown unit for additional 24 hours  Consultants:  CCM   Procedures:  2 D Echo 01/27/15: Study Conclusions  - Left ventricle: The cavity size was normal. Wall thickness was increased in a pattern of mild LVH. Systolic function was low normal to mildly reduced. The estimated ejection fraction was in the range of 50% to 55%. Wall motion was normal; there were no regional wall motion abnormalities. Doppler parameters are consistent with abnormal left ventricular relaxation (grade 1 diastolic dysfunction). - Aortic valve: There was no stenosis. There was mild regurgitation. - Mitral valve: Mildly calcified annulus. Mildly calcified leaflets . There was mild to moderate regurgitation. - Right ventricle: Poorly visualized. The cavity size was normal. Systolic function was normal. - Tricuspid valve: Peak RV-RA gradient (S): 30 mm Hg. - Pulmonary arteries: PA peak pressure: 45 mm Hg (S). - Systemic veins: IVC measured 2.3 cm with < 50% respirophasic variation, suggesting RA  pressure 15 mmHg.  Impressions:  - Normal LV size with mild LV hypertrophy. EF 50-55%, low  normal to mildly reduced systolic function. Normal RV size and systolic function. Mild to moderate MR. Mild pulmonary hypertension. Dilated IVC.  PICC line 01/27/15  Antibiotics:  IV vancomycin 6/30 >  IV Aztreonam 6/30 >  Subjective: Seen this morning. Dyspnea better. Denied chest pain. Denied cough.    Objective: Filed Vitals:   01/27/15 1220 01/27/15 1300 01/27/15 1328 01/27/15 1535  BP:   160/81   Pulse:   131   Temp:  97 F (36.1 C)    TempSrc:  Axillary    Resp:   20   Height:      Weight:      SpO2: 99%  100% 99%    Intake/Output Summary (Last 24 hours) at 01/27/15 1549 Last data filed at 01/27/15 1130  Gross per 24 hour  Intake    430 ml  Output    825 ml  Net   -395 ml   Filed Weights   01/26/15 1641  Weight: 60.555 kg (133 lb 8 oz)     Exam:  General exam: Pleasant elderly female lying comfortably propped up in bed in no obvious distress  Respiratory system:Diminished breath sounds in the bases but otherwise clear to auscultation without wheezing, rhonchi or crackles. No increased work of breathing. Cardiovascular system: S1 & S2 heard, RRR. No JVD, murmurs, gallops, clicks. Trace leg edema right >left. Telemetry: ST/SR  Gastrointestinal system: Abdomen is nondistended, soft and nontender. Normal bowel sounds heard. Central nervous system: Alert and oriented. No focal neurological deficits. Extremities: Symmetric 5 x 5 power.Easy bruising of skin.   Data Reviewed: Basic Metabolic Panel:  Recent Labs Lab 01/26/15 2020 01/27/15 0400  NA 142 146*  K 4.0 3.9  CL 109 112*  CO2 25 24  GLUCOSE 176* 171*  BUN 21* 21*  CREATININE 0.70 0.63  CALCIUM 9.0 9.0   Liver Function Tests:  Recent Labs Lab 01/26/15 2020  AST 21  ALT 33  ALKPHOS 87  BILITOT 0.5  PROT 5.3*  ALBUMIN 2.7*   No results for input(s): LIPASE, AMYLASE in the last 168  hours. No results for input(s): AMMONIA in the last 168 hours. CBC:  Recent Labs Lab 01/26/15 2020 01/27/15 0400  WBC 11.1* 8.4  NEUTROABS 10.5*  --   HGB 11.0* 10.4*  HCT 34.3* 31.7*  MCV 102.1* 101.3*  PLT 152 143*   Cardiac Enzymes:  Recent Labs Lab 01/26/15 2250 01/27/15 0400 01/27/15 1148  TROPONINI 0.41* 0.40* 0.31*   BNP (last 3 results) No results for input(s): PROBNP in the last 8760 hours. CBG: No results for input(s): GLUCAP in the last 168 hours.  Recent Results (from the past 240 hour(s))  MRSA PCR Screening     Status: Abnormal   Collection Time: 01/26/15  5:03 PM  Result Value Ref Range Status   MRSA by PCR POSITIVE (A) NEGATIVE Final    Comment:        The GeneXpert MRSA Assay (FDA approved for NASAL specimens only), is one component of a comprehensive MRSA colonization surveillance program. It is not intended to diagnose MRSA infection nor to guide or monitor treatment for MRSA infections. RESULT CALLED TO, READ BACK BY AND VERIFIED WITH: Tyson Babinski RN 2234 01/26/15 A BROWNING   Urine culture     Status: None (Preliminary result)   Collection Time: 01/26/15  6:21 PM  Result Value Ref Range Status   Specimen Description URINE, CATHETERIZED  Final   Special Requests NONE  Final   Culture NO  GROWTH < 24 HOURS  Final   Report Status PENDING  Incomplete  Culture, blood (routine x 2)     Status: None (Preliminary result)   Collection Time: 01/26/15  8:15 PM  Result Value Ref Range Status   Specimen Description BLOOD LEFT ARM  Final   Special Requests IN PEDIATRIC BOTTLE 3CC  Final   Culture NO GROWTH < 24 HOURS  Final   Report Status PENDING  Incomplete  Culture, blood (routine x 2)     Status: None (Preliminary result)   Collection Time: 01/26/15  8:20 PM  Result Value Ref Range Status   Specimen Description BLOOD LEFT ARM  Final   Special Requests BOTTLES DRAWN AEROBIC ONLY 5CC  Final   Culture NO GROWTH < 24 HOURS  Final   Report Status  PENDING  Incomplete      Studies: Dg Chest 2 View  01/26/2015   CLINICAL DATA:  81 year old female with COPD and wheezing.  EXAM: CHEST  2 VIEW  COMPARISON:  Chest radiograph dated 12/18/2014  FINDINGS: Two views of the chest demonstrate emphysematous changes with mild interstitial prominence similar to prior study. No focal consolidation. No pleural effusion or pneumothorax. The osseous structures are grossly unremarkable. Upper lumbar posterior fixation screws noted.  IMPRESSION: Emphysema.  No consolidation or pneumothorax.   Electronically Signed   By: Anner Crete M.D.   On: 01/26/2015 17:30   Dg Chest Port 1 View  01/27/2015   CLINICAL DATA:  Tachycardia  EXAM: PORTABLE CHEST - 1 VIEW  COMPARISON:  01/26/2015  FINDINGS: There is mild chronic appearing interstitial coarsening. There is no airspace consolidation. There is no large effusion. There is no pneumothorax. Heart size is normal unchanged.  IMPRESSION: No acute cardiopulmonary findings.   Electronically Signed   By: Andreas Newport M.D.   On: 01/27/2015 04:07        Scheduled Meds: . antiseptic oral rinse  7 mL Mouth Rinse BID  . arformoterol  15 mcg Nebulization Q12H  . aspirin  325 mg Oral Daily  . atorvastatin  10 mg Oral QHS  . aztreonam  1 g Intravenous 3 times per day  . baclofen  10 mg Oral TID  . budesonide (PULMICORT) nebulizer solution  0.25 mg Nebulization Daily  . cycloSPORINE  2 drop Both Eyes BID  . enoxaparin  40 mg Subcutaneous Daily  . ferrous sulfate  325 mg Oral TID WC  . fluticasone  1 spray Each Nare Daily  . losartan  100 mg Oral Daily   And  . hydrochlorothiazide  25 mg Oral Daily  . ipratropium  0.5 mg Nebulization Q4H  . [START ON 01/28/2015] levofloxacin  750 mg Oral Q48H  . [START ON 01/28/2015] methylPREDNISolone (SOLU-MEDROL) injection  40 mg Intravenous Q12H  . pantoprazole  40 mg Oral Daily  . polyethylene glycol  17 g Oral Daily  . rOPINIRole  1 mg Oral TID  . sodium chloride  10-40 mL  Intracatheter Q12H   Continuous Infusions:   Principal Problem:   Acute respiratory failure Active Problems:   Essential hypertension   COPD with acute bronchitis   HCAP (healthcare-associated pneumonia)    Time spent: 45 minutes.    Vernell Leep, MD, FACP, FHM. [facility] Pager [phone]  If 7PM-7AM, please contact night-coverage [email or web address] Password [email or web address] 01/27/2015, 3:49 PM    LOS: 1 day

## 2015-01-27 NOTE — Progress Notes (Signed)
Pt's troponin around 11pm 6/30 was elevated but not called to RN or NP. Georgann Housekeeper, NP for critical care, had  consulted and noted troponin and informed Probation officer. NP on unit and reviewed chart.  +pt was transferred here on 6/30 from [facility] where she was being treated for COPD/CAP. Her WBCC was trending up and since Dr. Joya Gaskins is pt's pulmonologist, family wanted her to come to [facility]. Troponin was ordered on admission without reference as to why in the H&P. Troponin .41. 12 lead showed ? ST elevation but no old EKG to compare. Pt has not c/o any CP. Given 4-5 hours have lapsed since first EKG, writer ordered another one. New one looks same as previous one but ? true ST elevation vs PVCs. Next troponin is due soon, so will wait on that. If bumped, will call cardiology. As stated before, pt has had NO ACTIVE CP. She is on ASA 325mg  po qd and Lipitor 10mg  po qhs. No hx CAD/MI that writer can find on chart.  R/p CXR shows no PNA.  Plan: COPD with acute exacerbation in face of chronic respiratory failure on home O2. Being treated with broad spectrum abx and cultures are pending. Pulmonary stated "doubt CAP" but continue abx for now. Can likely d/c those soon if no infection noted. Her WBCC is likely elevated due to use of steroids for COPD.  Continue to cycle trops and call cardiology as needed.   Will follow. Clance Boll, NP [facility]

## 2015-01-28 DIAGNOSIS — I10 Essential (primary) hypertension: Secondary | ICD-10-CM

## 2015-01-28 DIAGNOSIS — I5031 Acute diastolic (congestive) heart failure: Principal | ICD-10-CM

## 2015-01-28 LAB — BASIC METABOLIC PANEL
Anion gap: 10 (ref 5–15)
BUN: 21 mg/dL — ABNORMAL HIGH (ref 6–20)
CALCIUM: 9.1 mg/dL (ref 8.9–10.3)
CHLORIDE: 100 mmol/L — AB (ref 101–111)
CO2: 32 mmol/L (ref 22–32)
CREATININE: 0.71 mg/dL (ref 0.44–1.00)
GFR calc Af Amer: >60 mL/min (ref >60)
GFR calc non Af Amer: >60 mL/min (ref >60)
GLUCOSE: 199 mg/dL — AB (ref 65–99)
Potassium: 3.7 mmol/L (ref 3.5–5.1)
Sodium: 142 mmol/L (ref 135–145)

## 2015-01-28 LAB — GLUCOSE, CAPILLARY
GLUCOSE-CAPILLARY: 212 mg/dL — AB (ref 65–99)
Glucose-Capillary: 258 mg/dL — ABNORMAL HIGH (ref 65–99)
Glucose-Capillary: 203 mg/dL — ABNORMAL HIGH (ref 65–99)

## 2015-01-28 LAB — CBC
HEMATOCRIT: 34.6 % — AB (ref 36.0–46.0)
Hemoglobin: 11.2 g/dL — ABNORMAL LOW (ref 12.0–15.0)
MCH: 32.4 pg (ref 26.0–34.0)
MCHC: 32.4 g/dL (ref 30.0–36.0)
MCV: 100 fL (ref 78.0–100.0)
Platelets: 177 10*3/uL (ref 150–400)
RBC: 3.46 MIL/uL — ABNORMAL LOW (ref 3.87–5.11)
RDW: 14.6 % (ref 11.5–15.5)
WBC: 8.9 10*3/uL (ref 4.0–10.5)

## 2015-01-28 LAB — URINE CULTURE: Culture: NO GROWTH

## 2015-01-28 LAB — PROCALCITONIN: Procalcitonin: 3.45 ng/mL

## 2015-01-28 LAB — BRAIN NATRIURETIC PEPTIDE: B Natriuretic Peptide: 1310.4 pg/mL — ABNORMAL HIGH (ref 0.0–100.0)

## 2015-01-28 MED ORDER — PANTOPRAZOLE SODIUM 40 MG PO TBEC
40.0000 mg | DELAYED_RELEASE_TABLET | Freq: Two times a day (BID) | ORAL | Status: DC
  Administered 2015-01-28 – 2015-02-02 (×10): 40 mg via ORAL
  Filled 2015-01-28 (×9): qty 1

## 2015-01-28 MED ORDER — FUROSEMIDE 10 MG/ML IJ SOLN
40.0000 mg | Freq: Two times a day (BID) | INTRAMUSCULAR | Status: AC
  Administered 2015-01-28 (×2): 40 mg via INTRAVENOUS
  Filled 2015-01-28 (×2): qty 4

## 2015-01-28 MED ORDER — IRBESARTAN 150 MG PO TABS
150.0000 mg | ORAL_TABLET | Freq: Every day | ORAL | Status: DC
  Administered 2015-01-28 – 2015-02-02 (×6): 150 mg via ORAL
  Filled 2015-01-28 (×6): qty 1

## 2015-01-28 MED ORDER — INSULIN ASPART 100 UNIT/ML ~~LOC~~ SOLN
0.0000 [IU] | Freq: Three times a day (TID) | SUBCUTANEOUS | Status: DC
  Administered 2015-01-28: 5 [IU] via SUBCUTANEOUS
  Administered 2015-01-28 – 2015-01-29 (×2): 3 [IU] via SUBCUTANEOUS
  Administered 2015-01-29 (×2): 2 [IU] via SUBCUTANEOUS
  Administered 2015-01-30: 6 [IU] via SUBCUTANEOUS
  Administered 2015-01-30: 2 [IU] via SUBCUTANEOUS
  Administered 2015-01-31: 3 [IU] via SUBCUTANEOUS
  Administered 2015-02-01 – 2015-02-02 (×2): 2 [IU] via SUBCUTANEOUS
  Administered 2015-02-02: 1 [IU] via SUBCUTANEOUS

## 2015-01-28 NOTE — Progress Notes (Addendum)
PROGRESS NOTE    Tina [name] OZD:[id number] DOB: [birth year] DOA: 01/26/2015 PCP: Lolita Patella, MD  Primary Pulmonologist: Dr. Asencion Noble. Primary Cardiologist: Dr. Atilano Median in [facility].  HPI/Brief narrative 81 year old female patient with history of COPD, not on home oxygen until 2 weeks ago and since then has been on 2 L/m intermittently, former smoker-quit 20+ years ago, HTN, HLD, PAD status post right stents 2, chronic leg edema right >left, hard of hearing, was admitted on 6/27 to [facility] with RLL pneumonia and treated with IV vancomycin and levofloxacin. On 6/30, her condition worsened including tachycardia, acute respiratory failure, leukocytosis and worsening pneumonia. Family/patient requested transfer to [facility] so that her primary pulmonologist could evaluate her.   Assessment/Plan:  Acute on possible chronic hypoxic respiratory failure - Secondary to AECOPD, community-acquired pneumonia and possible Pulmonary edema - Admitted to stepdown unit for close monitoring and management - Treating empirically with broad-spectrum IV antibiotics: Vancomycin, levofloxacin & aztreonam. Vancomycin and aztreonam now discontinued. Continue levofloxacin. - Also treating with IV steroids, bronchodilator nebulizations, oxygen. - Repeat chest x-ray 01/27/15: No acute cardiopulmonary findings. - HIV antibodies: Nonreactive, pro-calcitonin: 9.37 >3.45, lactate normal, urine Legionella and pneumococcal antigen: Negative -Blood cultures 2 and urine culture times one: Negative to date - CCM consultation and follow-up appreciated: Low suspicion for CAP >narrowed antibiotics. BNP 849 > 1310, started IV Lasix 7/1. - Improved.  Acute exacerbation of COPD - Improved. Management as above  Acute diastolic CHF - BNP 259 > 5638, started IV Lasix 7/1. - Continue IV Lasix. - -2.5 L since admission.  ? Community-acquired pneumonia - Improved. Elevated pro-calcitonin-decreasing. Chest x-ray  repeated and negative. - Antibiotic spectrum narrowed to oral levofloxacin-complete total 7 days treatment  Elevated troponin - No reported chest pain or acute EKG changes (ST at 103 bpm, normal axis, occasional PVCs, no acute changes, Q waves in leads V1-2 and QTC 463 ms) - Possibly from demand ischemia related to acute respiratory failure. Downward troponin trend. - As per patient and family, no prior cardiac history. - 2-D echo: LVEF 50-55 percent and no wall motion abnormalities. May consider outpatient workup.  Anemia - Stable.  Thrombocytopenia  - Resolved   Hypertension - Mildly uncontrolled. Continue ARB.   HLD - Statins  Hard of hearing  PAD status post right lower extremity stents  Confusion/? Delirium - Likely secondary to acute illness. No focal deficits. Monitor - improved.   DVT prophylaxis: Lovenox  Code Status: Full  Family Communication: Discussed extensively with patient's spouse at bedside on 01/28/15  Disposition Plan: Continue management in stepdown unit for additional 24 hours  Consultants:  CCM   Procedures:  2 D Echo 01/27/15: Study Conclusions  - Left ventricle: The cavity size was normal. Wall thickness was increased in a pattern of mild LVH. Systolic function was low normal to mildly reduced. The estimated ejection fraction was in the range of 50% to 55%. Wall motion was normal; there were no regional wall motion abnormalities. Doppler parameters are consistent with abnormal left ventricular relaxation (grade 1 diastolic dysfunction). - Aortic valve: There was no stenosis. There was mild regurgitation. - Mitral valve: Mildly calcified annulus. Mildly calcified leaflets . There was mild to moderate regurgitation. - Right ventricle: Poorly visualized. The cavity size was normal. Systolic function was normal. - Tricuspid valve: Peak RV-RA gradient (S): 30 mm Hg. - Pulmonary arteries: PA peak pressure: 45 mm Hg (S). -  Systemic veins: IVC measured 2.3 cm with < 50% respirophasic variation, suggesting RA pressure  15 mmHg.  Impressions:  - Normal LV size with mild LV hypertrophy. EF 50-55%, low normal to mildly reduced systolic function. Normal RV size and systolic function. Mild to moderate MR. Mild pulmonary hypertension. Dilated IVC.  PICC line RUE 01/27/15  Foley catheter: Would keep additional 24 hours while on IV diuresis.  Antibiotics:  IV vancomycin 6/30 > DC'd  IV Aztreonam 6/30 > DC'd  Oral levofloxacin >  Subjective: Dyspnea better but not at baseline. Mild intermittent dry cough. No chest pain.   Objective: Filed Vitals:   01/28/15 0359 01/28/15 0400 01/28/15 0800 01/28/15 0905  BP:  164/80 156/73   Pulse:  99 112   Temp: 98.6 F (37 C)  98.2 F (36.8 C)   TempSrc: Oral  Oral   Resp:  15 17   Height:      Weight:      SpO2:  85% 99% 98%    Intake/Output Summary (Last 24 hours) at 01/28/15 1016 Last data filed at 01/28/15 0359  Gross per 24 hour  Intake    870 ml  Output   3400 ml  Net  -2530 ml   Filed Weights   01/26/15 1641  Weight: 60.555 kg (133 lb 8 oz)     Exam:  General exam: Pleasant elderly female lying comfortably propped up in bed in no obvious distress  Respiratory system:Diminished breath sounds in the bases but otherwise clear to auscultation without wheezing, rhonchi or crackles. No increased work of breathing. Cardiovascular system: S1 & S2 heard, RRR. No JVD, murmurs, gallops, clicks. Trace leg edema right >left, decreasing. Telemetry: Sinus rhythm. Occasional sinus tachycardia in the 130s-140s. Gastrointestinal system: Abdomen is nondistended, soft and nontender. Normal bowel sounds heard. Central nervous system: Alert and oriented. No focal neurological deficits. Extremities: Symmetric 5 x 5 power.Easy bruising of skin.   Data Reviewed: Basic Metabolic Panel:  Recent Labs Lab 01/26/15 2020 01/27/15 0400 01/28/15 0500  NA 142  146* 142  K 4.0 3.9 3.7  CL 109 112* 100*  CO2 25 24 32  GLUCOSE 176* 171* 199*  BUN 21* 21* 21*  CREATININE 0.70 0.63 0.71  CALCIUM 9.0 9.0 9.1   Liver Function Tests:  Recent Labs Lab 01/26/15 2020  AST 21  ALT 33  ALKPHOS 87  BILITOT 0.5  PROT 5.3*  ALBUMIN 2.7*   No results for input(s): LIPASE, AMYLASE in the last 168 hours. No results for input(s): AMMONIA in the last 168 hours. CBC:  Recent Labs Lab 01/26/15 2020 01/27/15 0400 01/28/15 0500  WBC 11.1* 8.4 8.9  NEUTROABS 10.5*  --   --   HGB 11.0* 10.4* 11.2*  HCT 34.3* 31.7* 34.6*  MCV 102.1* 101.3* 100.0  PLT 152 143* 177   Cardiac Enzymes:  Recent Labs Lab 01/26/15 2250 01/27/15 0400 01/27/15 1148  TROPONINI 0.41* 0.40* 0.31*   BNP (last 3 results) No results for input(s): PROBNP in the last 8760 hours. CBG: No results for input(s): GLUCAP in the last 168 hours.  Recent Results (from the past 240 hour(s))  MRSA PCR Screening     Status: Abnormal   Collection Time: 01/26/15  5:03 PM  Result Value Ref Range Status   MRSA by PCR POSITIVE (A) NEGATIVE Final    Comment:        The GeneXpert MRSA Assay (FDA approved for NASAL specimens only), is one component of a comprehensive MRSA colonization surveillance program. It is not intended to diagnose MRSA infection nor to guide or monitor  treatment for MRSA infections. RESULT CALLED TO, READ BACK BY AND VERIFIED WITH: Tyson Babinski RN 2234 01/26/15 A BROWNING   Urine culture     Status: None   Collection Time: 01/26/15  6:21 PM  Result Value Ref Range Status   Specimen Description URINE, CATHETERIZED  Final   Special Requests NONE  Final   Culture NO GROWTH 2 DAYS  Final   Report Status 01/28/2015 FINAL  Final  Culture, blood (routine x 2)     Status: None (Preliminary result)   Collection Time: 01/26/15  8:15 PM  Result Value Ref Range Status   Specimen Description BLOOD LEFT ARM  Final   Special Requests IN PEDIATRIC BOTTLE 3CC  Final    Culture NO GROWTH < 24 HOURS  Final   Report Status PENDING  Incomplete  Culture, blood (routine x 2)     Status: None (Preliminary result)   Collection Time: 01/26/15  8:20 PM  Result Value Ref Range Status   Specimen Description BLOOD LEFT ARM  Final   Special Requests BOTTLES DRAWN AEROBIC ONLY 5CC  Final   Culture NO GROWTH < 24 HOURS  Final   Report Status PENDING  Incomplete      Studies: Dg Chest 2 View  01/26/2015   CLINICAL DATA:  81 year old female with COPD and wheezing.  EXAM: CHEST  2 VIEW  COMPARISON:  Chest radiograph dated 12/18/2014  FINDINGS: Two views of the chest demonstrate emphysematous changes with mild interstitial prominence similar to prior study. No focal consolidation. No pleural effusion or pneumothorax. The osseous structures are grossly unremarkable. Upper lumbar posterior fixation screws noted.  IMPRESSION: Emphysema.  No consolidation or pneumothorax.   Electronically Signed   By: Anner Crete M.D.   On: 01/26/2015 17:30   Dg Chest Port 1 View  01/27/2015   CLINICAL DATA:  Tachycardia  EXAM: PORTABLE CHEST - 1 VIEW  COMPARISON:  01/26/2015  FINDINGS: There is mild chronic appearing interstitial coarsening. There is no airspace consolidation. There is no large effusion. There is no pneumothorax. Heart size is normal unchanged.  IMPRESSION: No acute cardiopulmonary findings.   Electronically Signed   By: Andreas Newport M.D.   On: 01/27/2015 04:07        Scheduled Meds: . antiseptic oral rinse  7 mL Mouth Rinse BID  . arformoterol  15 mcg Nebulization Q12H  . aspirin  325 mg Oral Daily  . atorvastatin  10 mg Oral QHS  . baclofen  10 mg Oral TID  . budesonide (PULMICORT) nebulizer solution  0.25 mg Nebulization Daily  . cycloSPORINE  2 drop Both Eyes BID  . enoxaparin  40 mg Subcutaneous Daily  . ferrous sulfate  325 mg Oral TID WC  . fluticasone  1 spray Each Nare Daily  . furosemide  40 mg Intravenous Q12H  . levofloxacin  750 mg Oral Q48H  .  losartan  100 mg Oral Daily  . methylPREDNISolone (SOLU-MEDROL) injection  40 mg Intravenous Q12H  . pantoprazole  40 mg Oral Daily  . polyethylene glycol  17 g Oral Daily  . rOPINIRole  1 mg Oral TID  . sodium chloride  10-40 mL Intracatheter Q12H  . tiotropium  18 mcg Inhalation Daily   Continuous Infusions:   Principal Problem:   Acute respiratory failure Active Problems:   Essential hypertension   COPD with acute bronchitis   HCAP (healthcare-associated pneumonia)   COPD exacerbation   Acute on chronic respiratory failure with hypoxia  Time spent: 25 minutes.    Vernell Leep, MD, FACP, FHM. [facility] Pager [phone]  If 7PM-7AM, please contact night-coverage [email or web address] Password [email or web address] 01/28/2015, 10:16 AM    LOS: 2 days

## 2015-01-28 NOTE — Progress Notes (Addendum)
  No respiratory distress No new complaints    Filed Vitals:   01/28/15 0400 01/28/15 0800 01/28/15 0905 01/28/15 1200  BP: 164/80 156/73  145/53  Pulse: 99 112  109  Temp:  98.2 F (36.8 C)  98.1 F (36.7 C)  TempSrc:  Oral  Oral  Resp: 15 17  18   Height:      Weight:      SpO2: 85% 99% 98% 99%   Chronically ill appearing NAD HEENT WNL No JVD Markedly diminished BS with prolonged exp phase / pseudowheeze only better with purse lip  Reg, no M NABS No edema  chronic steroid use skin changes  BMET    Component Value Date/Time   NA 142 01/28/2015 0500   K 3.7 01/28/2015 0500   CL 100* 01/28/2015 0500   CO2 32 01/28/2015 0500   GLUCOSE 199* 01/28/2015 0500   BUN 21* 01/28/2015 0500   CREATININE 0.71 01/28/2015 0500   CALCIUM 9.1 01/28/2015 0500   GFRNONAA >60 01/28/2015 0500   GFRAA >60 01/28/2015 0500    CBC    Component Value Date/Time   WBC 8.9 01/28/2015 0500   RBC 3.46* 01/28/2015 0500   HGB 11.2* 01/28/2015 0500   HCT 34.6* 01/28/2015 0500   PLT 177 01/28/2015 0500   MCV 100.0 01/28/2015 0500   MCH 32.4 01/28/2015 0500   MCHC 32.4 01/28/2015 0500   RDW 14.6 01/28/2015 0500   LYMPHSABS 0.3* 01/26/2015 2020   MONOABS 0.3 01/26/2015 2020   EOSABS 0.0 01/26/2015 2020   BASOSABS 0.0 01/26/2015 2020    I personally reviewed images and agree with radiology impression as follows:    pCXR 01/27/15  There is mild chronic appearing interstitial coarsening. There is no airspace consolidation. There is no large effusion. There is no pneumothorax. Heart size is normal unchanged.   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   COPD  GOLD II by last spirometry 05/10/10 with FEV1 1.04 (69%) ratio 49   - chronic steroid, chronic O2> baseline walks at [place] pushing cart which carries her 02 so this is not endstage dz Chronic hypercarbic and hypoxemic resp failure   Doubt PNA General frailty and declining health   PLAN/REC: Cont nebulized steroids/LABA Change  ipratropium to tiotropium = LAMA  Max gerd rx/ encourage flutter for pseudowheeze component  Taper systemic steroids back to baseline of pred 5 mg daily Complete levaquin 01/31/15 planned empriical for bronchitis Ideally should not be on dpi with pseudowheeze but respimat not on formulary    2) HBP with MR on echo 01/27/15  So need tight control/ afterload reduction  Changed cozar to avapro as generic cozar assoc with pseudowheeze    Christinia Gully, MD Pulmonary and [place] Cell [phone] After 5:30 PM or weekends, call [phone]

## 2015-01-29 DIAGNOSIS — I471 Supraventricular tachycardia: Secondary | ICD-10-CM

## 2015-01-29 DIAGNOSIS — E876 Hypokalemia: Secondary | ICD-10-CM

## 2015-01-29 LAB — BASIC METABOLIC PANEL
Anion gap: 9 (ref 5–15)
BUN: 23 mg/dL — ABNORMAL HIGH (ref 6–20)
CALCIUM: 8.9 mg/dL (ref 8.9–10.3)
CHLORIDE: 93 mmol/L — AB (ref 101–111)
CO2: 42 mmol/L — ABNORMAL HIGH (ref 22–32)
Creatinine, Ser: 0.72 mg/dL (ref 0.44–1.00)
GFR calc Af Amer: >60 mL/min (ref >60)
GFR calc non Af Amer: >60 mL/min (ref >60)
GLUCOSE: 191 mg/dL — AB (ref 65–99)
Potassium: 3 mmol/L — ABNORMAL LOW (ref 3.5–5.1)
SODIUM: 144 mmol/L (ref 135–145)

## 2015-01-29 LAB — EXPECTORATED SPUTUM ASSESSMENT W GRAM STAIN, RFLX TO RESP C

## 2015-01-29 LAB — GLUCOSE, CAPILLARY
GLUCOSE-CAPILLARY: 171 mg/dL — AB (ref 65–99)
GLUCOSE-CAPILLARY: 289 mg/dL — AB (ref 65–99)
Glucose-Capillary: 209 mg/dL — ABNORMAL HIGH (ref 65–99)
Glucose-Capillary: 203 mg/dL — ABNORMAL HIGH (ref 65–99)

## 2015-01-29 LAB — EXPECTORATED SPUTUM ASSESSMENT W REFEX TO RESP CULTURE

## 2015-01-29 MED ORDER — PREDNISONE 20 MG PO TABS
40.0000 mg | ORAL_TABLET | Freq: Every day | ORAL | Status: DC
  Administered 2015-01-29 – 2015-02-02 (×3): 40 mg via ORAL
  Filled 2015-01-29 (×6): qty 2

## 2015-01-29 MED ORDER — FUROSEMIDE 40 MG PO TABS
40.0000 mg | ORAL_TABLET | Freq: Every day | ORAL | Status: DC
  Administered 2015-01-29 – 2015-01-31 (×3): 40 mg via ORAL
  Filled 2015-01-29 (×3): qty 1

## 2015-01-29 MED ORDER — GUAIFENESIN ER 600 MG PO TB12
1200.0000 mg | ORAL_TABLET | Freq: Two times a day (BID) | ORAL | Status: DC
Start: 2015-01-29 — End: 2015-02-02
  Administered 2015-01-29 – 2015-02-02 (×9): 1200 mg via ORAL
  Filled 2015-01-29 (×10): qty 2

## 2015-01-29 MED ORDER — BISOPROLOL FUMARATE 5 MG PO TABS
2.5000 mg | ORAL_TABLET | Freq: Every day | ORAL | Status: DC
  Administered 2015-01-29 – 2015-02-02 (×5): 2.5 mg via ORAL
  Filled 2015-01-29 (×5): qty 0.5

## 2015-01-29 MED ORDER — POTASSIUM CHLORIDE CRYS ER 20 MEQ PO TBCR
30.0000 meq | EXTENDED_RELEASE_TABLET | ORAL | Status: AC
  Administered 2015-01-29 (×2): 30 meq via ORAL
  Filled 2015-01-29 (×2): qty 1

## 2015-01-29 NOTE — Progress Notes (Deleted)
Attention to Pulm MD  Please advise Pulm meds for DC and OP F/U with Dr. Joya Gaskins, on your note  Thanks Vernell Leep, MD, [place], [facility]. [facility] Pager [phone]  If 7PM-7AM, please contact night-coverage [email or web address] Password [facility] 01/29/2015, 8:24 AM

## 2015-01-29 NOTE — Progress Notes (Signed)
Attempted report X2 

## 2015-01-29 NOTE — Progress Notes (Signed)
Called and gave report to Arma, RN on 3E. Patient's husband at bedside. Patient transported to [place] with RN and NT.

## 2015-01-29 NOTE — Progress Notes (Signed)
No respiratory distress Now congested cough with yellow mucus > sent  Has flutter at home thought it was supposed to help her breath in - asked  Husband to bring it in so we can show her how to use it.     Filed Vitals:   01/29/15 0400 01/29/15 0700 01/29/15 0800 01/29/15 0900  BP: 148/63 154/69 131/102   Pulse: 70 70 91   Temp:   97.9 F (36.6 C)   TempSrc:   Oral   Resp: 12 11 20    Height:      Weight:      SpO2: 99% 100% 96% 98%   Chronically ill appearing NAD HEENT WNL No JVD Markedly diminished BS with prolonged exp phase / junky rhonchi on exp  Reg, no M NABS No edema  chronic steroid use skin changes    Intake/Output Summary (Last 24 hours) at 01/29/15 0915 Last data filed at 01/29/15 0757  Gross per 24 hour  Intake    480 ml  Output   4200 ml  Net  -3720 ml     BMET    Component Value Date/Time   NA 144 01/29/2015 0625   K 3.0* 01/29/2015 0625   CL 93* 01/29/2015 0625   CO2 42* 01/29/2015 0625   GLUCOSE 191* 01/29/2015 0625   BUN 23* 01/29/2015 0625   CREATININE 0.72 01/29/2015 0625   CALCIUM 8.9 01/29/2015 0625   GFRNONAA >60 01/29/2015 0625   GFRAA >60 01/29/2015 0625    CBC    Component Value Date/Time   WBC 8.9 01/28/2015 0500   RBC 3.46* 01/28/2015 0500   HGB 11.2* 01/28/2015 0500   HCT 34.6* 01/28/2015 0500   PLT 177 01/28/2015 0500   MCV 100.0 01/28/2015 0500   MCH 32.4 01/28/2015 0500   MCHC 32.4 01/28/2015 0500   RDW 14.6 01/28/2015 0500   LYMPHSABS 0.3* 01/26/2015 2020   MONOABS 0.3 01/26/2015 2020   EOSABS 0.0 01/26/2015 2020   BASOSABS 0.0 01/26/2015 2020    I personally reviewed images and agree with radiology impression as follows:    pCXR 01/27/15  There is mild chronic appearing interstitial coarsening. There is no airspace consolidation. There is no large effusion. There is no pneumothorax. Heart size is normal unchanged.   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   COPD  GOLD II by last spirometry 05/10/10 with FEV1  1.04 (69%) ratio 49   - chronic steroid rx, chronic O2> baseline walks at [employer] pushing cart which carries her 02 so this is not endstage dz Chronic hypercarbic and hypoxemic resp failure   Doubt PNA but clearly poor mc fxn/ tracheobronchitis likely  General frailty and declining health but not endstage from a pulmonary perspective    PLAN/REC: Cont nebulized steroids/LABA >    has at home already Change ipratropium to tiotropium = LAMA>>    has at home already Max gerd rx/ encourage flutter for pseudowheeze component >   has at home already Taper systemic steroids back to baseline of pred 5 mg daily/02 2lpm >  has at home already Complete levaquin 01/31/15 planned emprical for bronchitis  Ideally should not be on dpi with pseudowheeze but respimat not on formulary so will do this in office setting    2) HBP with MR on echo 01/27/15  So need really tight control/ afterload reduction  Changed cozar to avapro as generic cozar assoc with pseudowheeze > needs new rx for Avapro    3) Hypokalemia > rx  per [facility]   We can see prn - thx     Christinia Gully, MD Pulmonary and [place] [phone] After 5:30 PM or weekends, call [phone]

## 2015-01-29 NOTE — Progress Notes (Signed)
PROGRESS NOTE    Tina [name] WVP:[id number] DOB: 1934-01-18 DOA: 01/26/2015 PCP: Lolita Patella, MD  Primary Pulmonologist: Dr. Asencion Noble. Primary Cardiologist: Dr. Atilano Median in [facility].  HPI/Brief narrative 81 year old female patient with history of COPD, not on home oxygen until 2 weeks ago and since then has been on 2 L/m intermittently, former smoker-quit 20+ years ago, HTN, HLD, PAD status post right stents 2, chronic leg edema right >left, hard of hearing, was admitted on 6/27 to [facility] with RLL pneumonia and treated with IV vancomycin and levofloxacin. On 6/30, her condition worsened including tachycardia, acute respiratory failure, leukocytosis and worsening pneumonia. Family/patient requested transfer to [facility] so that her primary pulmonologist could evaluate her.   Assessment/Plan:  Acute on possible chronic hypoxic respiratory failure - Secondary to AECOPD, community-acquired pneumonia (felt less likely) and possible Pulmonary edema - Admitted to stepdown unit for close monitoring and management - Treating empirically with broad-spectrum IV antibiotics: Vancomycin, levofloxacin & aztreonam. Vancomycin and aztreonam now discontinued. Continue levofloxacin. - Also treating with IV steroids, bronchodilator nebulizations, oxygen. - Repeat chest x-ray 01/27/15: No acute cardiopulmonary findings. - HIV antibodies: Nonreactive, pro-calcitonin: 9.37 >3.45, lactate normal, urine Legionella and pneumococcal antigen: Negative -Blood cultures 2 and urine culture times one: Negative to date - CCM consultation and follow-up appreciated: Low suspicion for CAP >narrowed antibiotics. BNP 849 > 1310, started IV Lasix 7/1. - Improved. - Continues to improve. Transfer to telemetry on 7/3. Possible discharge home in the next 24-48 hours.  Acute exacerbation of COPD - Improved. Management as above - Pulmonary follow-up and recommendations appreciated.  Acute diastolic CHF - BNP 546  > 1310, started IV Lasix 7/1. - Continue IV Lasix. - -5.6 L since admission. - Improved. Changed Lasix to by mouth.  ? Community-acquired pneumonia - Improved. Elevated pro-calcitonin-decreasing. Chest x-ray repeated and negative. - Antibiotic spectrum narrowed to oral levofloxacin-complete total 7 days treatment - Felt to be more infectious tracheobronchitis.  Elevated troponin - No reported chest pain or acute EKG changes (ST at 103 bpm, normal axis, occasional PVCs, no acute changes, Q waves in leads V1-2 and QTC 463 ms) - Possibly from demand ischemia related to acute respiratory failure. Downward troponin trend. - As per patient and family, no prior cardiac history. - 2-D echo: LVEF 50-55 percent and no wall motion abnormalities. May consider outpatient workup.  Anemia - Stable.  Thrombocytopenia  - Resolved   Hypertension - Mildly uncontrolled. Continue ARB (changed from Cozaar to Avapro).   HLD - Statins  Hard of hearing  PAD status post right lower extremity stents  Confusion/? Delirium - Likely secondary to acute illness. No focal deficits. Monitor - improved.  Paroxysmal atrial tachycardia - Likely precipitated by acute lung issues. Reviewed telemetry with the cardiologist on 7/3. As per recommendations, started bisoprolol low-dose at 2.5 MG daily.  Hypokalemia - Secondary to diuresis. Replace and follow.  Hyperglycemia - Check A1c. Placed on SSI.   DVT prophylaxis: Lovenox  Code Status: Full  Family Communication: Discussed extensively with patient's spouse at bedside on 01/29/15  Disposition Plan: Transfer to telemetry and possible discharge home in 24-48 hours.  Consultants:  CCM   Procedures:  2 D Echo 01/27/15: Study Conclusions  - Left ventricle: The cavity size was normal. Wall thickness was increased in a pattern of mild LVH. Systolic function was low normal to mildly reduced. The estimated ejection fraction was in the range of 50% to  55%. Wall motion was normal; there were no regional wall  motion abnormalities. Doppler parameters are consistent with abnormal left ventricular relaxation (grade 1 diastolic dysfunction). - Aortic valve: There was no stenosis. There was mild regurgitation. - Mitral valve: Mildly calcified annulus. Mildly calcified leaflets . There was mild to moderate regurgitation. - Right ventricle: Poorly visualized. The cavity size was normal. Systolic function was normal. - Tricuspid valve: Peak RV-RA gradient (S): 30 mm Hg. - Pulmonary arteries: PA peak pressure: 45 mm Hg (S). - Systemic veins: IVC measured 2.3 cm with < 50% respirophasic variation, suggesting RA pressure 15 mmHg.  Impressions:  - Normal LV size with mild LV hypertrophy. EF 50-55%, low normal to mildly reduced systolic function. Normal RV size and systolic function. Mild to moderate MR. Mild pulmonary hypertension. Dilated IVC.  PICC line RUE 01/27/15  Foley catheter: Would keep additional 24 hours while on IV diuresis.  Antibiotics:  IV vancomycin 6/30 > DC'd  IV Aztreonam 6/30 > DC'd  Oral levofloxacin > 01/31/15  Subjective: Dyspnea continues to improve. Cough now productive of yellowish sputum. No chest pain reported. Diuresed well and leg edema decreased.   Objective: Filed Vitals:   01/29/15 0700 01/29/15 0800 01/29/15 0900 01/29/15 1039  BP: 154/69 131/102 134/72 141/49  Pulse: 70 91 55 91  Temp:  97.9 F (36.6 C)  97.7 F (36.5 C)  TempSrc:  Oral  Oral  Resp: 11 20 20 20   Height:    5\' 1"  (1.549 m)  Weight:      SpO2: 100% 96% 98% 99%    Intake/Output Summary (Last 24 hours) at 01/29/15 1052 Last data filed at 01/29/15 0900  Gross per 24 hour  Intake    840 ml  Output   4200 ml  Net  -3360 ml   Filed Weights   01/26/15 1641  Weight: 60.555 kg (133 lb 8 oz)     Exam:  General exam: Pleasant elderly female lying comfortably propped up in bed in no obvious distress    Respiratory system: clear to auscultation without wheezing, rhonchi or crackles. No increased work of breathing. Cardiovascular system: S1 & S2 heard, RRR. No JVD, murmurs, gallops, clicks. Sinus rhythm with frequent PACs. Occasional nonsustained runs of atrial tachycardia. Trace leg edema right >left. Right leg apparently chronically larger than left since stenting procedure. Gastrointestinal system: Abdomen is nondistended, soft and nontender. Normal bowel sounds heard. Central nervous system: Alert and oriented. No focal neurological deficits. Extremities: Symmetric 5 x 5 power.Easy bruising of skin.   Data Reviewed: Basic Metabolic Panel:  Recent Labs Lab 01/26/15 2020 01/27/15 0400 01/28/15 0500 01/29/15 0625  NA 142 146* 142 144  K 4.0 3.9 3.7 3.0*  CL 109 112* 100* 93*  CO2 25 24 32 42*  GLUCOSE 176* 171* 199* 191*  BUN 21* 21* 21* 23*  CREATININE 0.70 0.63 0.71 0.72  CALCIUM 9.0 9.0 9.1 8.9   Liver Function Tests:  Recent Labs Lab 01/26/15 2020  AST 21  ALT 33  ALKPHOS 87  BILITOT 0.5  PROT 5.3*  ALBUMIN 2.7*   No results for input(s): LIPASE, AMYLASE in the last 168 hours. No results for input(s): AMMONIA in the last 168 hours. CBC:  Recent Labs Lab 01/26/15 2020 01/27/15 0400 01/28/15 0500  WBC 11.1* 8.4 8.9  NEUTROABS 10.5*  --   --   HGB 11.0* 10.4* 11.2*  HCT 34.3* 31.7* 34.6*  MCV 102.1* 101.3* 100.0  PLT 152 143* 177   Cardiac Enzymes:  Recent Labs Lab 01/26/15 2250 01/27/15 0400 01/27/15 1148  TROPONINI 0.41* 0.40* 0.31*   BNP (last 3 results) No results for input(s): PROBNP in the last 8760 hours. CBG:  Recent Labs Lab 01/28/15 1119 01/28/15 1627 01/28/15 2049 01/29/15 0804  GLUCAP 258* 212* 203* 171*    Recent Results (from the past 240 hour(s))  MRSA PCR Screening     Status: Abnormal   Collection Time: 01/26/15  5:03 PM  Result Value Ref Range Status   MRSA by PCR POSITIVE (A) NEGATIVE Final    Comment:        The  GeneXpert MRSA Assay (FDA approved for NASAL specimens only), is one component of a comprehensive MRSA colonization surveillance program. It is not intended to diagnose MRSA infection nor to guide or monitor treatment for MRSA infections. RESULT CALLED TO, READ BACK BY AND VERIFIED WITH: Tyson Babinski RN [id number] 01/26/15 A BROWNING   Urine culture     Status: None   Collection Time: 01/26/15  6:21 PM  Result Value Ref Range Status   Specimen Description URINE, CATHETERIZED  Final   Special Requests NONE  Final   Culture NO GROWTH 2 DAYS  Final   Report Status 01/28/2015 FINAL  Final  Culture, blood (routine x 2)     Status: None (Preliminary result)   Collection Time: 01/26/15  8:15 PM  Result Value Ref Range Status   Specimen Description BLOOD LEFT ARM  Final   Special Requests IN PEDIATRIC BOTTLE 3CC  Final   Culture NO GROWTH 2 DAYS  Final   Report Status PENDING  Incomplete  Culture, blood (routine x 2)     Status: None (Preliminary result)   Collection Time: 01/26/15  8:20 PM  Result Value Ref Range Status   Specimen Description BLOOD LEFT ARM  Final   Special Requests BOTTLES DRAWN AEROBIC ONLY 5CC  Final   Culture NO GROWTH 2 DAYS  Final   Report Status PENDING  Incomplete  Culture, sputum-assessment     Status: None   Collection Time: 01/29/15  5:28 AM  Result Value Ref Range Status   Specimen Description SPUTUM  Final   Special Requests NONE  Final   Sputum evaluation   Final    THIS SPECIMEN IS ACCEPTABLE. RESPIRATORY CULTURE REPORT TO FOLLOW.   Report Status 01/29/2015 FINAL  Final      Studies: No results found.      Scheduled Meds: . antiseptic oral rinse  7 mL Mouth Rinse BID  . arformoterol  15 mcg Nebulization Q12H  . aspirin  325 mg Oral Daily  . atorvastatin  10 mg Oral QHS  . baclofen  10 mg Oral TID  . bisoprolol  2.5 mg Oral Daily  . budesonide (PULMICORT) nebulizer solution  0.25 mg Nebulization Daily  . cycloSPORINE  2 drop Both Eyes BID  .  enoxaparin  40 mg Subcutaneous Daily  . ferrous sulfate  325 mg Oral TID WC  . fluticasone  1 spray Each Nare Daily  . furosemide  40 mg Oral Daily  . insulin aspart  0-9 Units Subcutaneous TID WC  . irbesartan  150 mg Oral Daily  . levofloxacin  750 mg Oral Q48H  . pantoprazole  40 mg Oral BID AC  . polyethylene glycol  17 g Oral Daily  . potassium chloride  30 mEq Oral Q4H  . predniSONE  40 mg Oral Q breakfast  . rOPINIRole  1 mg Oral TID  . sodium chloride  10-40 mL Intracatheter Q12H  . tiotropium  18  mcg Inhalation Daily   Continuous Infusions:   Principal Problem:   Acute respiratory failure Active Problems:   Essential hypertension   COPD with acute bronchitis   HCAP (healthcare-associated pneumonia)   COPD exacerbation   Acute on chronic respiratory failure with hypoxia    Time spent: 40 minutes.    Vernell Leep, MD, FACP, F[facility]. [facility] Pager [phone]  If 7PM-7AM, please contact night-coverage [email or web address] Password [email or web address] 01/29/2015, 10:52 AM    LOS: 3 days

## 2015-01-30 ENCOUNTER — Encounter (HOSPITAL_COMMUNITY): Payer: Self-pay | Admitting: Physician Assistant

## 2015-01-30 DIAGNOSIS — R079 Chest pain, unspecified: Secondary | ICD-10-CM

## 2015-01-30 DIAGNOSIS — R0789 Other chest pain: Secondary | ICD-10-CM

## 2015-01-30 DIAGNOSIS — R7989 Other specified abnormal findings of blood chemistry: Secondary | ICD-10-CM

## 2015-01-30 DIAGNOSIS — J9620 Acute and chronic respiratory failure, unspecified whether with hypoxia or hypercapnia: Secondary | ICD-10-CM

## 2015-01-30 DIAGNOSIS — R778 Other specified abnormalities of plasma proteins: Secondary | ICD-10-CM

## 2015-01-30 LAB — GLUCOSE, CAPILLARY
GLUCOSE-CAPILLARY: 170 mg/dL — AB (ref 65–99)
GLUCOSE-CAPILLARY: 254 mg/dL — AB (ref 65–99)
GLUCOSE-CAPILLARY: 193 mg/dL — AB (ref 65–99)
Glucose-Capillary: 117 mg/dL — ABNORMAL HIGH (ref 65–99)

## 2015-01-30 LAB — BASIC METABOLIC PANEL
Anion gap: 8 (ref 5–15)
BUN: 32 mg/dL — AB (ref 6–20)
CALCIUM: 8.7 mg/dL — AB (ref 8.9–10.3)
CHLORIDE: 93 mmol/L — AB (ref 101–111)
CO2: 39 mmol/L — AB (ref 22–32)
Creatinine, Ser: 0.59 mg/dL (ref 0.44–1.00)
GFR calc non Af Amer: >60 mL/min (ref >60)
Glucose, Bld: 154 mg/dL — ABNORMAL HIGH (ref 65–99)
Potassium: 3.5 mmol/L (ref 3.5–5.1)
SODIUM: 140 mmol/L (ref 135–145)

## 2015-01-30 LAB — HEPARIN LEVEL (UNFRACTIONATED): HEPARIN UNFRACTIONATED: 1.82 [IU]/mL — AB (ref 0.30–0.70)

## 2015-01-30 LAB — TROPONIN I: Troponin I: 0.22 ng/mL — ABNORMAL HIGH (ref <0.031)

## 2015-01-30 LAB — PROCALCITONIN: PROCALCITONIN: 1.09 ng/mL

## 2015-01-30 MED ORDER — NITROGLYCERIN 0.4 MG SL SUBL
SUBLINGUAL_TABLET | SUBLINGUAL | Status: AC
  Administered 2015-01-30 (×2): 0.4 mg
  Filled 2015-01-30: qty 1

## 2015-01-30 MED ORDER — POTASSIUM CHLORIDE CRYS ER 20 MEQ PO TBCR
20.0000 meq | EXTENDED_RELEASE_TABLET | Freq: Once | ORAL | Status: AC
  Administered 2015-01-30: 20 meq via ORAL
  Filled 2015-01-30: qty 1

## 2015-01-30 MED ORDER — HEPARIN BOLUS VIA INFUSION
3000.0000 [IU] | Freq: Once | INTRAVENOUS | Status: AC
  Administered 2015-01-30: 3000 [IU] via INTRAVENOUS
  Filled 2015-01-30: qty 3000

## 2015-01-30 MED ORDER — ATORVASTATIN CALCIUM 80 MG PO TABS
80.0000 mg | ORAL_TABLET | Freq: Every day | ORAL | Status: DC
  Administered 2015-01-30 – 2015-02-01 (×3): 80 mg via ORAL
  Filled 2015-01-30 (×4): qty 1

## 2015-01-30 MED ORDER — HEPARIN (PORCINE) IN NACL 100-0.45 UNIT/ML-% IJ SOLN
700.0000 [IU]/h | INTRAMUSCULAR | Status: DC
  Filled 2015-01-30: qty 250

## 2015-01-30 MED ORDER — HEPARIN (PORCINE) IN NACL 100-0.45 UNIT/ML-% IJ SOLN
900.0000 [IU]/h | INTRAMUSCULAR | Status: DC
  Administered 2015-01-30: 900 [IU]/h via INTRAVENOUS
  Filled 2015-01-30: qty 250

## 2015-01-30 NOTE — Consult Note (Addendum)
Cardiology Consultation Note  Patient ID: Tina [name], MRN: [id number], DOB/AGE: 04-06-1934 81 y.o. Admit date: 01/26/2015   Date of Consult: 01/30/2015 Primary Physician: Tina Patella, MD Primary Cardiologist: Tina [name]  Chief Complaint: chest pain Reason for Consultation: chest pain  HPI: Tina [name] is a 81 y/o F with history of COPD, chronic respiratory failure on O2, HTN, HLD, PAD s/p prior R SFA stenting (followed by [facility] - chronic RLE since that time) who was transferred to [facility] on 01/26/15 due to worsening respiratory status. She was admitted 6/27 to [facility] with dyspnea. She has followed with [name] for her COPD and nocturnal dyspnea. She  has been struggling with hip pain and herniated disc and was recently given [name] prescription of baclofen and Percocet by neurology. On 6/27 she awoke with profound SOB and nonproductive cough. She was taken to [facility] and admitted with diagnosis of RLL CAP. She was placed on broad spectrum antibiotics, bronchodilators, and oral steroids. On 6/30 she was noted to have rising WBC 16.3, worsening PNA, confusion, and acute on chronic respiratory failure. She had requested transfer to be evaluated by her primary pulmonologist. Procalcitonin was 9, with normal lactate, urine legionella and pneumococcus. PCCM felt this likely to be AECOPD with tracheobronchitis and less likely CAP. They recommended to simplify abx and Tina [name] he felt it would be reasonable to have Palliative Care see pt to begin discussions re: goals of care. She was also felt to have some component of acute diastolic CHF since BNP was elevated to 1310 thus Lasix was given with improvement and diuresis. 2D echo 01/27/15: mild LVH, EF 50-55%, grade 1 DD, mild AI, mild-mod MR, PASP 86mmHg, dilated IVC. Labs notable for troponin 0.41 on admission here -> 0.40->0.31->0.22. These were initially felt due to demand ischemia as she was not having CP at that time. CO2 39.  CXR 01/27/15: nonacute, mild chronic appearing interstitial coarsening.   Yesterday she felt very flushed. Overnight she developed chest discomfort around 2am, substernal, on the right side. She decided to notify staff around 5am of the pain. 2 SL NTG did not give relief. O2 sats were preserved but BP fell with NTG. A breathing treatment did help reduce her pain. She also got the PRN pain medication that was ordered and the pain went away completely. She remains pain free this morning. She does report occasional episodes of chest pressure before today, typically occurring in the AM and tending to go away as the day goes on. She walks with a walker at home. She says her dyspnea is back to baseline. She says she had a leg stent at [name] about a year ago, but was taken off Plavix due to a skin reaction where her arms turned even darker than they are at baseline (has chronic steroid skin changes). Telemetry reveals NSR with intermittent episodes of multifocal atrial tachycardia, PVCs. She reporst history of labile BPs.   Past Medical History  Diagnosis Date  . Malignant melanoma     no recurrence on leg  . HTN (hypertension)   . COPD (chronic obstructive pulmonary disease)     FeV1 51% 4/09  . Allergic rhinitis   . Degenerative arthritis of foot   . Vision abnormalities   . Trigeminal neuralgia   . Chronic respiratory failure   . PAD (peripheral artery disease)     a. h/o R SFA stenting per records. b. Duplex 09/2014: diffuse atherosclerosis without significant occlusive or aneurysmal disease.  Most Recent Cardiac Studies: 2D Echo 01/27/15 - Left ventricle: The cavity size was normal. Wall thickness was increased in a pattern of mild LVH. Systolic function was low normal to mildly reduced. The estimated ejection fraction was in the range of 50% to 55%. Wall motion was normal; there were no regional wall motion abnormalities. Doppler parameters are consistent with abnormal left  ventricular relaxation (grade 1 diastolic dysfunction). - Aortic valve: There was no stenosis. There was mild regurgitation. - Mitral valve: Mildly calcified annulus. Mildly calcified leaflets . There was mild to moderate regurgitation. - Right ventricle: Poorly visualized. The cavity size was normal. Systolic function was normal. - Tricuspid valve: Peak RV-RA gradient (S): 30 mm Hg. - Pulmonary arteries: PA peak pressure: 45 mm Hg (S). - Systemic veins: IVC measured 2.3 cm with < 50% respirophasic variation, suggesting RA pressure 15 mmHg. Impressions: - Normal LV size with mild LV hypertrophy. EF 50-55%, low normal to mildly reduced systolic function. Normal RV size and systolic function. Mild to moderate MR. Mild pulmonary hypertension. Dilated IVC.   Surgical History:  Past Surgical History  Procedure Laterality Date  . Appendectomy    . Mastectomy      Bilateral 1980s  . Cholecystectomy    . Total abdominal hysterectomy       Home Meds: Prior to Admission medications   Medication Sig Start Date End Date Taking? Authorizing Provider  aspirin 325 MG tablet Take 325 mg by mouth daily.   Yes Historical Provider, MD  atorvastatin (LIPITOR) 10 MG tablet Take 10 mg by mouth at bedtime.   Yes Historical Provider, MD  baclofen (LIORESAL) 10 MG tablet Take 1 tablet (10 mg total) by mouth 3 (three) times daily. Patient taking differently: Take 10 mg by mouth 3 (three) times daily as needed for muscle spasms.  10/24/14  Yes Tina August Saucer, MD  budesonide (PULMICORT) 0.25 MG/2ML nebulizer solution Take 2 mLs (0.25 mg total) by nebulization daily. Dx code J44.1 Patient taking differently: Take 0.25 mg by nebulization 2 (two) times daily. Dx code J44.1 10/27/14  Yes Tina Stain, MD  clobetasol cream (TEMOVATE) 5.68 % Apply 1 application topically 2 (two) times daily as needed (finger irritation).    Yes Historical Provider, MD  cycloSPORINE (RESTASIS) 0.05 % ophthalmic  emulsion Place 2 drops into both eyes 2 (two) times daily.   Yes Historical Provider, MD  ferrous sulfate 325 (65 FE) MG tablet Take 325 mg by mouth at bedtime.    Yes Historical Provider, MD  formoterol (PERFOROMIST) 20 MCG/2ML nebulizer solution Take 2 mLs (20 mcg total) by nebulization 2 (two) times daily. Dx code J44.1 10/27/14  Yes Tina Stain, MD  lidocaine (LIDODERM) 5 % Place 1 patch onto the skin daily as needed (hip pain).    Yes Historical Provider, MD  losartan (COZAAR) 25 MG tablet Take 75 mg by mouth daily. 01/26/15  Yes Historical Provider, MD  mometasone (NASONEX) 50 MCG/ACT nasal spray Place 2 sprays into the nose daily. Patient taking differently: Place 2 sprays into the nose daily as needed (congestion, rhinitis).  03/24/14  Yes Tina Stain, MD  polyethylene glycol powder (GLYCOLAX/MIRALAX) powder Take 17 g by mouth as needed for mild constipation or moderate constipation.    Yes Historical Provider, MD  potassium chloride SA (K-DUR,KLOR-CON) 20 MEQ tablet Take 20 mEq by mouth 2 (two) times daily.   Yes Historical Provider, MD  rOPINIRole (REQUIP) 1 MG tablet Take 1 mg by mouth 2 (two)  times daily.    Yes Historical Provider, MD  tiotropium (SPIRIVA) 18 MCG inhalation capsule Place 1 capsule (18 mcg total) into inhaler and inhale daily. 03/24/14  Yes Tina Stain, MD  esomeprazole (NEXIUM) 40 MG capsule Take 1 capsule (40 mg total) by mouth daily before breakfast. Patient not taking: Reported on 01/27/2015 10/27/14   Tina Stain, MD  guaiFENesin (MUCINEX) 600 MG 12 hr tablet Take 600 mg by mouth at bedtime.    Historical Provider, MD  oxyCODONE-acetaminophen (PERCOCET) 7.5-325 MG per tablet Take 1 tablet by mouth every 8 (eight) hours as needed. Patient taking differently: Take 1 tablet by mouth every 8 (eight) hours as needed for moderate pain or severe pain.  01/23/15   Britt Bottom, MD    Inpatient Medications:  . antiseptic oral rinse  7 mL Mouth Rinse BID  .  arformoterol  15 mcg Nebulization Q12H  . aspirin  325 mg Oral Daily  . atorvastatin  10 mg Oral QHS  . baclofen  10 mg Oral TID  . bisoprolol  2.5 mg Oral Daily  . budesonide (PULMICORT) nebulizer solution  0.25 mg Nebulization Daily  . cycloSPORINE  2 drop Both Eyes BID  . enoxaparin  40 mg Subcutaneous Daily  . ferrous sulfate  325 mg Oral TID WC  . fluticasone  1 spray Each Nare Daily  . furosemide  40 mg Oral Daily  . guaiFENesin  1,200 mg Oral BID  . insulin aspart  0-9 Units Subcutaneous TID WC  . irbesartan  150 mg Oral Daily  . levofloxacin  750 mg Oral Q48H  . pantoprazole  40 mg Oral BID AC  . polyethylene glycol  17 g Oral Daily  . predniSONE  40 mg Oral Q breakfast  . rOPINIRole  1 mg Oral TID  . sodium chloride  10-40 mL Intracatheter Q12H  . tiotropium  18 mcg Inhalation Daily      Allergies:  Allergies  Allergen Reactions  . Doxycycline     REACTION: insomnia  . Fluoxetine   . Horizant [Gabapentin]   . Other     Bee stings - internal swelling per pt Bee stings - internal swelling per pt  . Penicillins     REACTION: shock  . Plavix [Clopidogrel]     Arm turned purple, rash  . Prozac [Fluoxetine Hcl]   . Sulfa Antibiotics   . Codeine Rash    History   Social History  . Marital Status: Married    Spouse Name: N/A  . Number of Children: N/A  . Years of Education: N/A   Occupational History  . Not on file.   Social History Main Topics  . Smoking status: Former Smoker -- 0.30 packs/day for 30 years    Types: Cigarettes    Quit date: 07/29/1982  . Smokeless tobacco: Never Used  . Alcohol Use: Not on file  . Drug Use: Not on file  . Sexual Activity: Not on file   Other Topics Concern  . Not on file   Social History Narrative   patient signed designated party release granting access to [facility] to husband Fritz Pickerel  detailled message may be left on home phone Shanon Payor  May 10, 2010 12:06 PM     Family History  Problem Relation Age of  Onset  . Diabetes    . Alzheimer's disease       Review of Systems: No syncope. No BRBPR, melena, hematemesis, orthopnea. All other systems reviewed and  are otherwise negative except as noted above.  Labs:  Recent Labs  01/27/15 1148 01/30/15 0730  TROPONINI 0.31* 0.22*   Lab Results  Component Value Date   WBC 8.9 01/28/2015   HGB 11.2* 01/28/2015   HCT 34.6* 01/28/2015   MCV 100.0 01/28/2015   PLT 177 01/28/2015    Recent Labs Lab 01/26/15 2020  01/30/15 0432  NA 142  < > 140  K 4.0  < > 3.5  CL 109  < > 93*  CO2 25  < > 39*  BUN 21*  < > 32*  CREATININE 0.70  < > 0.59  CALCIUM 9.0  < > 8.7*  PROT 5.3*  --   --   BILITOT 0.5  --   --   ALKPHOS 87  --   --   ALT 33  --   --   AST 21  --   --   GLUCOSE 176*  < > 154*  < > = values in this interval not displayed. No results found for: CHOL, HDL, LDLCALC, TRIG No results found for: DDIMER  Radiology/Studies:  Dg Chest 2 View  01/26/2015   CLINICAL DATA:  81 year old female with COPD and wheezing.  EXAM: CHEST  2 VIEW  COMPARISON:  Chest radiograph dated 12/18/2014  FINDINGS: Two views of the chest demonstrate emphysematous changes with mild interstitial prominence similar to prior study. No focal consolidation. No pleural effusion or pneumothorax. The osseous structures are grossly unremarkable. Upper lumbar posterior fixation screws noted.  IMPRESSION: Emphysema.  No consolidation or pneumothorax.   Electronically Signed   By: Anner Crete M.D.   On: 01/26/2015 17:30   Dg Chest Port 1 View  01/27/2015   CLINICAL DATA:  Tachycardia  EXAM: PORTABLE CHEST - 1 VIEW  COMPARISON:  01/26/2015  FINDINGS: There is mild chronic appearing interstitial coarsening. There is no airspace consolidation. There is no large effusion. There is no pneumothorax. Heart size is normal unchanged.  IMPRESSION: No acute cardiopulmonary findings.   Electronically Signed   By: Andreas Newport M.D.   On: 01/27/2015 04:07    Wt Readings  from Last 3 Encounters:  01/30/15 128 lb 9.6 oz (58.333 kg)  01/23/15 124 lb 6.4 oz (56.427 kg)  11/18/14 132 lb 9.6 oz (60.147 kg)   EKG:  EKG overnight was NSR 94bpm with biphasic appearing ST-T changes in V1-V3 (Tina [name] from before), TW flattening I, TWI avL, LVH, possible prior septal infarct Prior tracings reviewed as well - NSR, LVH, possible prior septal infarct, PVCs  Physical Exam: Blood pressure 161/58, pulse 76, temperature 98.3 F (36.8 C), temperature source Oral, resp. rate 20, height 5\' 1"  (1.549 m), weight 128 lb 9.6 oz (58.333 kg), SpO2 100 %. General: Well developed, thin, frail appearing elderly WF in no acute distress. Head: Normocephalic, atraumatic, sclera non-icteric, no xanthomas, nares are without discharge.  Neck: Soft right carotid bruit. No bruit on the left. JVD not elevated. Lungs: Diffusely diminished throughout, mild crackles at RL base. Breathing is unlabored. Heart: RRR with S1 S2. No murmurs, rubs, or gallops appreciated. Abdomen: Soft, non-tender, non-distended with normoactive bowel sounds. No hepatomegaly. No rebound/guarding. No obvious abdominal masses. Msk:  Strength and tone appear normal for age. Extremities: No clubbing or cyanosis. + RLE edema which the patient states is chronic.  Distal pedal pulses are 2+ and equal bilaterally. Extremities have skin pigmentation darkening throughout. Neuro: Alert and oriented X 3. No facial asymmetry. No focal deficit. Moves all extremities spontaneously.  Psych:  Responds to questions appropriately with a normal affect.    Assessment and Plan:  1. Acute on chronic respiratory failure in the setting of AECOPD and tracheobronchitis with baseline severe COPD a 2. Chest pain with previous mildly elevated troponin this admission 3. Labile HTN 4. Multifocal atrial tachycardia, likely due to #1 5. PAD s/p LE stenting last year, not on Plavix due to skin reaction, soft right carotid bruit, can be followed as  outpatient 6. Acute diastolic CHF, improved  Chest pain was somewhat atypical (unrelieved with NTG, improved with breathing treatment) but EKG is concerning for underlying CAD. The mildly elevated troponins she had earlier this admission also portend a worse prognosis. Risk factors for underlying CAD include HTN, HLD, and known vascular disease with PAD/LE stenting. However, she did not tolerate Plavix and therefore has been on aspirin. She is maintained on ASA, BB, statin at the present time. Will discuss further plans for workup with Dr. Harl Bowie. If further BP management is needed could consider addition of amlodipine.  SignedMelina Copa PA-C 01/30/2015, 9:13 AM Pager: [phone]  Patient seen and discussed with PA Dunn, I agree with her documentation above. 81 yo female history of COPD on home O2, HTN, HL, PAD with stents previously, recent pneumonia admitted with SOB, fevers, chills. Cardiology is consulted for chest pain.  WBC 8.4, Hgb 10.4, Plt 143, K 3.9, Cr 0.63, BNP 1310,  Trop 0.41-->0.22 EKG SR, LAE, deep biphasic Twaves anterior leads with ST elevation, no reciprocal ST/T changes, possible Wellens sign.  Echo LVEF 50-55%, no WMAs, grade I diastolic dysfunction  Patient with chronic lung disease with recent pneumonia and exacerbation of her COPD. Episodes of chest pain with troponin 0.41, EKG with anterior ST/T changes with possible Wellens signs suggesting possible prox LA D disease. Chest pain somewhat atypical for cardiac pain however with her EKG and troponins, along with risk factors including PAD there is high concern. Patient is willing for possible cath, however will need to sort out her plavix allergy first. She reports history of severe rash when on plavix in the past, has not been on either effient or brillinta it appears. Will consult pharmacy about possible cross reactivity, pharm consult placed in epic. Medical therapy with ASA, atorva 80, bisoprolol, irbesartan. Will start  anticoagulation. Episodes of MAT noted, no afib, she has been started on bisoprolol. May titrate further as needed for rate control. If no contraindication for effient or brillinta, and improved from pulmonary standpoint would consider cath this admit. Noninvasive testing would be complicated due high pretest probability of disease. She also cannot run on treadmilll, has COPD and MAT which would make pharmacological study difficult. Will also request records form her cardiologist Dr Elonda Husky at [facility] in [place].    Zandra Abts MD

## 2015-01-30 NOTE — Progress Notes (Signed)
ANTICOAGULATION CONSULT NOTE - Initial Consult  Pharmacy Consult for Heparin Indication: chest pain/ACS  Allergies  Allergen Reactions  . Doxycycline     REACTION: insomnia  . Fluoxetine   . Horizant [Gabapentin]   . Other     Bee stings - internal swelling per pt Bee stings - internal swelling per pt  . Penicillins     REACTION: shock  . Plavix [Clopidogrel]     Arm turned purple, rash  . Prozac [Fluoxetine Hcl]   . Sulfa Antibiotics   . Codeine Rash    Patient Measurements: Height: 5\' 1"  (154.9 cm) Weight: 128 lb 9.6 oz (58.333 kg) IBW/kg (Calculated) : 47.8  Vital Signs: Temp: 98.3 F (36.8 C) (07/04 0711) Temp Source: Oral (07/04 0711) BP: 161/58 mmHg (07/04 0711) Pulse Rate: 76 (07/04 0711)  Labs:  Recent Labs  01/27/15 1148 01/28/15 0500 01/29/15 0625 01/30/15 0432 01/30/15 0730  HGB  --  11.2*  --   --   --   HCT  --  34.6*  --   --   --   PLT  --  177  --   --   --   CREATININE  --  0.71 0.72 0.59  --   TROPONINI 0.31*  --   --   --  0.22*    Estimated Creatinine Clearance: 46.8 mL/min (by C-G formula based on Cr of 0.59).   Medical History: Past Medical History  Diagnosis Date  . Malignant melanoma     no recurrence on leg  . HTN (hypertension)   . COPD (chronic obstructive pulmonary disease)     FeV1 51% 4/09  . Allergic rhinitis   . Degenerative arthritis of foot   . Vision abnormalities   . Trigeminal neuralgia   . Chronic respiratory failure   . PAD (peripheral artery disease)     a. h/o R SFA stenting per records. b. Duplex 09/2014: diffuse atherosclerosis without significant occlusive or aneurysmal disease.    Assessment: 81 year old female with a complex medical history known to pharmacy from previous antibiotic dosing now to begin heparin for ACS in preparation for cath  Goal of Therapy:  Heparin level 0.3-0.7 units/ml Monitor platelets by anticoagulation protocol: Yes   Plan:  Heparin 3000 units iv bolus x 1 Heparin drip  at 900 units / hr Heparin level 8 hours after heparin starts Daily heparin level, CBC  Thank you. Anette Guarneri, PharmD [phone]  01/30/2015,10:52 AM

## 2015-01-30 NOTE — Care Management (Signed)
Medicare Important Message given? YES (If response is "NO", the following Medicare IM given date fields will be blank) Date Medicare IM given:01/30/15 Medicare IM given by: Darika Ildefonso 

## 2015-01-30 NOTE — Progress Notes (Signed)
Occupational Therapy Evaluation Patient Details Name: Tina [name] MRN: [id number] DOB: 09/05/1933 Today's Date: 01/30/2015    History of Present Illness 81 year old female with COPD (PW patient) was admitted in [facility] for AECOPD and CAP on 6/27. 6/30 her WBC and PNA were noted to be worsening. She requested [facility] to [facility] Scheduled for cardiac cath 7/5?   Clinical Impression   PTA, pt living at home with husband and was mod I with ADL and mobility. Pt making progress at this time and would be appropriate for D/C home with [facility] services and 24/7 S. However, will follow acutely and adjust D/C recommendations pending cardiac cath and POC.    Follow Up Recommendations  Home health OT;Supervision/Assistance - 24 hour    Equipment Recommendations  None recommended by OT    Recommendations for Other Services PT consult     Precautions / Restrictions Precautions Precautions: Fall      Mobility Bed Mobility Overal bed mobility: Needs Assistance Bed Mobility: Supine to Sit              Transfers Overall transfer level: Needs assistance Equipment used: Rolling walker (2 wheeled) Transfers: Sit to/from [facility] Sit to Stand: Min guard Stand pivot transfers: Min guard            Balance Overall balance assessment: Needs assistance   Sitting balance-Leahy Scale: Good       Standing balance-Leahy Scale: Fair                              ADL Overall ADL's : Needs assistance/impaired     Grooming: Min guard;Standing   Upper Body Bathing: Minimal assitance;Sitting   Lower Body Bathing: Minimal assistance;Sit to/from stand   Upper Body Dressing : Minimal assistance   Lower Body Dressing: Minimal assistance;Sit to/from stand   Toilet Transfer: Min guard;Ambulation;Comfort height toilet;RW   Toileting- Clothing Manipulation and Hygiene: Supervision/safety;Sit to/from stand       Functional mobility during ADLs: Min  guard;Rolling walker General ADL Comments: HR 89. no c/o chest discomfort during ambulation t toilet.                     Pertinent Vitals/Pain Pain Assessment: No/denies pain     Hand Dominance     Extremity/Trunk Assessment Upper Extremity Assessment Upper Extremity Assessment: Generalized weakness   Lower Extremity Assessment Lower Extremity Assessment: Generalized weakness   Cervical / Trunk Assessment Cervical / Trunk Assessment: Normal   Communication Communication Communication: No difficulties   Cognition Arousal/Alertness: Awake/alert Behavior During Therapy: WFL for tasks assessed/performed Overall Cognitive Status: No family/caregiver present to determine baseline cognitive functioning                                        Home Living Family/patient expects to be discharged to:: Private residence Living Arrangements: Spouse/significant other Available Help at Discharge: Family;Available 24 hours/day Type of Home: House Home Access: Stairs to enter [employer] of Steps: 1   Home Layout: One level     Bathroom Shower/Tub: Occupational psychologist: Handicapped height Bathroom Accessibility: Yes How Accessible: Accessible via walker Home Equipment: Cane - single point;Shower seat;Walker - 2 wheels;Bedside commode          Prior Functioning/Environment Level of Independence: Independent  Comments: pt states she was sponge bathing, walking in the house and spouse does the homemaking    OT Diagnosis: Generalized weakness   OT Problem List: Decreased strength;Decreased activity tolerance;Decreased safety awareness;Decreased knowledge of use of DME or AE;Cardiopulmonary status limiting activity   OT Treatment/Interventions: Self-care/ADL training;Therapeutic exercise;Energy conservation;DME and/or AE instruction;Therapeutic activities;Patient/family education    OT Goals(Current goals can be found in the  care plan section) Acute Rehab OT Goals Patient Stated Goal: return home  OT Goal Formulation: With patient Time For Goal Achievement: 02/13/15 Potential to Achieve Goals: Good  OT Frequency: Min 2X/week   Barriers to D/C:            Co-evaluation              End of Session Equipment Utilized During Treatment: Gait belt;Rolling walker;Oxygen (2 L) Nurse Communication: Mobility status  Activity Tolerance: Patient tolerated treatment well Patient left: in chair;with call bell/phone within reach;with family/visitor present   Time: 3709-6438 OT Time Calculation (min): 24 min Charges:  OT General Charges $OT Visit: 1 Procedure OT Evaluation $Initial OT Evaluation Tier I: 1 Procedure OT Treatments $Self Care/Home Management : 8-22 mins G-Codes:    Makenli Derstine,HILLARY 02/21/15, 5:29 PM   [facility], OTR/L  [phone] February 21, 2015

## 2015-01-30 NOTE — Progress Notes (Signed)
PT Cancellation Note  Patient Details Name: Tina [name] MRN: [id number] DOB: Aug 20, 1933   Cancelled Treatment:    Reason Eval/Treat Not Completed: Medical issues which prohibited therapy Pt with elevated troponin this AM. PT order discontinued per MD on 7/3. Will await new orders to continue PT treatment.    Marguarite Arbour A Adonis Yim 01/30/2015, 2:29 PM Wray Kearns, [place], DPT [phone]

## 2015-01-30 NOTE — Progress Notes (Addendum)
ANTICOAGULATION CONSULT NOTE - Initial Consult  Pharmacy Consult for Heparin Indication: chest pain/ACS  Allergies  Allergen Reactions  . Doxycycline     REACTION: insomnia  . Fluoxetine   . Horizant [Gabapentin]   . Other     Bee stings - internal swelling per pt Bee stings - internal swelling per pt  . Penicillins     REACTION: shock  . Plavix [Clopidogrel]     Arm turned purple, rash  . Prozac [Fluoxetine Hcl]   . Sulfa Antibiotics   . Codeine Rash    Patient Measurements: Height: 5\' 1"  (154.9 cm) Weight: 128 lb 9.6 oz (58.333 kg) IBW/kg (Calculated) : 47.8  Vital Signs: Temp: 98.3 F (36.8 C) (07/04 0711) Temp Source: Oral (07/04 0711) BP: 161/58 mmHg (07/04 0711) Pulse Rate: 76 (07/04 0711)  Labs:  Recent Labs  01/27/15 1148 01/28/15 0500 01/29/15 0625 01/30/15 0432 01/30/15 0730  HGB  --  11.2*  --   --   --   HCT  --  34.6*  --   --   --   PLT  --  177  --   --   --   CREATININE  --  0.71 0.72 0.59  --   TROPONINI 0.31*  --   --   --  0.22*    Estimated Creatinine Clearance: 46.8 mL/min (by C-G formula based on Cr of 0.59).   Medical History: Past Medical History  Diagnosis Date  . Malignant melanoma     no recurrence on leg  . HTN (hypertension)   . COPD (chronic obstructive pulmonary disease)     FeV1 51% 4/09  . Allergic rhinitis   . Degenerative arthritis of foot   . Vision abnormalities   . Trigeminal neuralgia   . Chronic respiratory failure   . PAD (peripheral artery disease)     a. h/o R SFA stenting per records. b. Duplex 09/2014: diffuse atherosclerosis without significant occlusive or aneurysmal disease.    Assessment: 81 year old female with a complex medical history known to pharmacy from previous antibiotic dosing now to begin heparin for ACS in preparation for cath  Case reports indicate if there is an allergic reaction to one anti-platelet - option is to switch to another.  Should be little cross-sensitivity across the  three agents.  Goal of Therapy:  Heparin level 0.3-0.7 units/ml Monitor platelets by anticoagulation protocol: Yes   Plan:  Heparin 3000 units iv bolus x 1 Heparin drip at 900 units / hr Heparin level 8 hours after heparin starts Daily heparin level, CBC  Thank you. Anette Guarneri, PharmD [phone]  01/30/2015,10:56 AM

## 2015-01-30 NOTE — Progress Notes (Signed)
Pt was c/o CP on the R side 9/10, put pt in upright position, pt had O2 off for a brief moment put back on O2, sats were fine at 100 % on 2L, gave 2 SL of nitro no relief of pain did ECG showed NSR, called MD b/c her BP dropped from 160 to 108, MD notified and suggested to give Xopenex and her pain med, once Xopenex was given pain went to a 5/10, after breathing given gave her PRN pain meds and pain was relieved, will continue to monitor, thanks Ronalee Belts f RN

## 2015-01-30 NOTE — Progress Notes (Addendum)
PROGRESS NOTE    Tina [name] Credit OZH:[id number] DOB: 1933-10-26 DOA: 01/26/2015 PCP: Lolita Patella, MD  Primary Pulmonologist: Dr. Asencion Noble. Primary Cardiologist: Dr. Jonathon Jordan in [facility].  HPI/Brief narrative 81 year old female patient with history of COPD, not on home oxygen until 2 weeks ago and since then has been on 2 L/m intermittently, former smoker-quit 20+ years ago, HTN, HLD, PAD status post right stents 2, chronic leg edema right >left, hard of hearing, was admitted on 6/27 to [facility] with RLL pneumonia and treated with IV vancomycin and levofloxacin. On 6/30, her condition worsened including tachycardia, acute respiratory failure, leukocytosis and worsening pneumonia. Family/patient requested transfer to [facility] so that her primary pulmonologist could evaluate her.   Assessment/Plan:  Acute on chronic hypoxic respiratory failure - Secondary to AECOPD, community-acquired pneumonia (felt less likely) and possible Pulmonary edema - Admitted to stepdown unit for close monitoring and management - Treating empirically with broad-spectrum IV antibiotics: Vancomycin, levofloxacin & aztreonam. Vancomycin and aztreonam now discontinued. Continue levofloxacin and complete on 01/31/15. - Also treated with IV steroids, bronchodilator nebulizations, oxygen. - Repeat chest x-ray 01/27/15: No acute cardiopulmonary findings. - HIV antibodies: Nonreactive, pro-calcitonin: 9.37 >3.45, lactate normal, urine Legionella and pneumococcal antigen: Negative -Blood cultures 2 and urine culture times one: Negative to date - CCM consultation and follow-up appreciated: Low suspicion for CAP >narrowed antibiotics. BNP 849 > 1310, started IV Lasix 7/1. - Improved. - Continues to improve. Transferred to telemetry on 7/3.   Acute exacerbation of COPD - Improved. Management as above - Pulmonary follow-up and recommendations appreciated. - Please see last pulmonary note 7/2 & 7/3 for discharge  medication recommendations. - Outpatient follow-up with Dr. Joya Gaskins  Acute diastolic CHF - BNP 629 > 5284, started IV Lasix 7/1. - Continue IV Lasix. - -5.6 L since admission. - Improved. Changed Lasix to by mouth.  Chest pain/elevated troponins - Early morning of 7/4, patient complained of right-sided chest pain, severe, radiating to right upper extremity, associated with headache, not relieved with sublingual NTG 2 but then relieved after breathing treatment. - Resolved - Initially had presented with elevated troponins which in the absence of chest pain were felt to be from demand ischemia. -  2-D echo: LVEF 50-55 percent and no wall motion abnormalities. May consider outpatient workup. - Cardiology consulted and are concerned regarding CAD. They are obtaining records from outpatient cardiologist, clarifying allergies and are considering cardiac cath this admission. - Troponin has trended down to 0.22. - Started on IV heparin by cardiology. Continue aspirin, statins, beta blockers.  ? Community-acquired pneumonia - Improved. Elevated pro-calcitonin-decreasing. Chest x-ray repeated and negative. - Antibiotic spectrum narrowed to oral levofloxacin-complete total 7 days treatment - Felt to be more infectious tracheobronchitis.  Anemia - Stable.  Thrombocytopenia  - Resolved   Hypertension - Mildly uncontrolled. Continue ARB (changed from Cozaar to Avapro).   HLD - Statins  Hard of hearing  PAD status post right lower extremity stents  Confusion/? Delirium - Likely secondary to acute illness. No focal deficits. Monitor - improved.  Paroxysmal multifocal atrial tachycardia - Likely precipitated by acute lung issues. Reviewed telemetry with the cardiologist on 7/3. As per recommendations, started bisoprolol low-dose at 2.5 MG daily. - Improved with less frequent episodes.  Hypokalemia - Secondary to diuresis. Improved.  Hyperglycemia - Check A1c. Placed on SSI.   DVT  prophylaxis: Changed to IV heparin Code Status: Full  Family Communication: Discussed extensively with patient's spouse at bedside on 01/30/15  Disposition Plan: Pending medical  stability and PT eval.? Home versus SNF.  Consultants:  CCM   Procedures:  2 D Echo 01/27/15: Study Conclusions  - Left ventricle: The cavity size was normal. Wall thickness was increased in a pattern of mild LVH. Systolic function was low normal to mildly reduced. The estimated ejection fraction was in the range of 50% to 55%. Wall motion was normal; there were no regional wall motion abnormalities. Doppler parameters are consistent with abnormal left ventricular relaxation (grade 1 diastolic dysfunction). - Aortic valve: There was no stenosis. There was mild regurgitation. - Mitral valve: Mildly calcified annulus. Mildly calcified leaflets . There was mild to moderate regurgitation. - Right ventricle: Poorly visualized. The cavity size was normal. Systolic function was normal. - Tricuspid valve: Peak RV-RA gradient (S): 30 mm Hg. - Pulmonary arteries: PA peak pressure: 45 mm Hg (S). - Systemic veins: IVC measured 2.3 cm with < 50% respirophasic variation, suggesting RA pressure 15 mmHg.  Impressions:  - Normal LV size with mild LV hypertrophy. EF 50-55%, low normal to mildly reduced systolic function. Normal RV size and systolic function. Mild to moderate MR. Mild pulmonary hypertension. Dilated IVC.  PICC line RUE 01/27/15  Foley catheter: DC'd 7/3  Antibiotics:  IV vancomycin 6/30 > DC'd  IV Aztreonam 6/30 > DC'd  Oral levofloxacin > 01/31/15  Subjective:  Early morning of 7/4, patient complained of right-sided chest pain, severe, radiating to right upper extremity, associated with headache, not relieved with sublingual NTG 2 but then relieved after breathing treatment.   Objective: Filed Vitals:   01/30/15 0521 01/30/15 0711 01/30/15 1012 01/30/15 1021  BP:  108/85 161/58    Pulse: 87 76    Temp:  98.3 F (36.8 C)    TempSrc:  Oral    Resp:  20    Height:      Weight:  58.333 kg (128 lb 9.6 oz)    SpO2:  100% 100% 100%    Intake/Output Summary (Last 24 hours) at 01/30/15 1429 Last data filed at 01/30/15 1304  Gross per 24 hour  Intake    690 ml  Output    450 ml  Net    240 ml   Filed Weights   01/26/15 1641 01/30/15 0711  Weight: 60.555 kg (133 lb 8 oz) 58.333 kg (128 lb 9.6 oz)     Exam:  General exam: Pleasant elderly female lying comfortably propped up in bed in no obvious distress  Respiratory system: clear to auscultation without wheezing, rhonchi or crackles. No increased work of breathing. Cardiovascular system: S1 & S2 heard, RRR. No JVD, murmurs, gallops, clicks. Sinus rhythm with less frequent PACs. Trace leg edema right >left. Right leg apparently chronically larger than left since stenting procedure. Gastrointestinal system: Abdomen is nondistended, soft and nontender. Normal bowel sounds heard. Central nervous system: Alert and oriented. No focal neurological deficits. Extremities: Symmetric 5 x 5 power.Easy bruising of skin.   Data Reviewed: Basic Metabolic Panel:  Recent Labs Lab 01/26/15 2020 01/27/15 0400 01/28/15 0500 01/29/15 0625 01/30/15 0432  NA 142 146* 142 144 140  K 4.0 3.9 3.7 3.0* 3.5  CL 109 112* 100* 93* 93*  CO2 25 24 32 42* 39*  GLUCOSE 176* 171* 199* 191* 154*  BUN 21* 21* 21* 23* 32*  CREATININE 0.70 0.63 0.71 0.72 0.59  CALCIUM 9.0 9.0 9.1 8.9 8.7*   Liver Function Tests:  Recent Labs Lab 01/26/15 2020  AST 21  ALT 33  ALKPHOS 87  BILITOT 0.5  PROT 5.3*  ALBUMIN 2.7*   No results for input(s): LIPASE, AMYLASE in the last 168 hours. No results for input(s): AMMONIA in the last 168 hours. CBC:  Recent Labs Lab 01/26/15 2020 01/27/15 0400 01/28/15 0500  WBC 11.1* 8.4 8.9  NEUTROABS 10.5*  --   --   HGB 11.0* 10.4* 11.2*  HCT 34.3* 31.7* 34.6*  MCV 102.1* 101.3*  100.0  PLT 152 143* 177   Cardiac Enzymes:  Recent Labs Lab 01/26/15 2250 01/27/15 0400 01/27/15 1148 01/30/15 0730  TROPONINI 0.41* 0.40* 0.31* 0.22*   BNP (last 3 results) No results for input(s): PROBNP in the last 8760 hours. CBG:  Recent Labs Lab 01/29/15 1135 01/29/15 1648 01/29/15 2132 01/30/15 0658 01/30/15 1148  GLUCAP 209* 289* 203* 117* 170*    Recent Results (from the past 240 hour(s))  MRSA PCR Screening     Status: Abnormal   Collection Time: 01/26/15  5:03 PM  Result Value Ref Range Status   MRSA by PCR POSITIVE (A) NEGATIVE Final    Comment:        The GeneXpert MRSA Assay (FDA approved for NASAL specimens only), is one component of a comprehensive MRSA colonization surveillance program. It is not intended to diagnose MRSA infection nor to guide or monitor treatment for MRSA infections. RESULT CALLED TO, READ BACK BY AND VERIFIED WITH: Tyson Babinski RN [id number] 01/26/15 A BROWNING   Urine culture     Status: None   Collection Time: 01/26/15  6:21 PM  Result Value Ref Range Status   Specimen Description URINE, CATHETERIZED  Final   Special Requests NONE  Final   Culture NO GROWTH 2 DAYS  Final   Report Status 01/28/2015 FINAL  Final  Culture, blood (routine x 2)     Status: None (Preliminary result)   Collection Time: 01/26/15  8:15 PM  Result Value Ref Range Status   Specimen Description BLOOD LEFT ARM  Final   Special Requests IN PEDIATRIC BOTTLE 3CC  Final   Culture NO GROWTH 3 DAYS  Final   Report Status PENDING  Incomplete  Culture, blood (routine x 2)     Status: None (Preliminary result)   Collection Time: 01/26/15  8:20 PM  Result Value Ref Range Status   Specimen Description BLOOD LEFT ARM  Final   Special Requests BOTTLES DRAWN AEROBIC ONLY 5CC  Final   Culture NO GROWTH 3 DAYS  Final   Report Status PENDING  Incomplete  Culture, sputum-assessment     Status: None   Collection Time: 01/29/15  5:28 AM  Result Value Ref Range Status    Specimen Description SPUTUM  Final   Special Requests NONE  Final   Sputum evaluation   Final    THIS SPECIMEN IS ACCEPTABLE. RESPIRATORY CULTURE REPORT TO FOLLOW.   Report Status 01/29/2015 FINAL  Final  Culture, respiratory (NON-Expectorated)     Status: None (Preliminary result)   Collection Time: 01/29/15  5:28 AM  Result Value Ref Range Status   Specimen Description SPUTUM  Final   Special Requests NONE  Final   Gram Stain   Final    RARE WBC PRESENT,BOTH PMN AND MONONUCLEAR FEW SQUAMOUS EPITHELIAL CELLS PRESENT FEW GRAM POSITIVE COCCI IN PAIRS Performed at [facility]    Culture   Final    Culture reincubated for better growth Performed at [facility]    Report Status PENDING  Incomplete      Studies: No results found.  Scheduled Meds: . antiseptic oral rinse  7 mL Mouth Rinse BID  . arformoterol  15 mcg Nebulization Q12H  . aspirin  325 mg Oral Daily  . atorvastatin  80 mg Oral QHS  . baclofen  10 mg Oral TID  . bisoprolol  2.5 mg Oral Daily  . budesonide (PULMICORT) nebulizer solution  0.25 mg Nebulization Daily  . cycloSPORINE  2 drop Both Eyes BID  . ferrous sulfate  325 mg Oral TID WC  . fluticasone  1 spray Each Nare Daily  . furosemide  40 mg Oral Daily  . guaiFENesin  1,200 mg Oral BID  . insulin aspart  0-9 Units Subcutaneous TID WC  . irbesartan  150 mg Oral Daily  . levofloxacin  750 mg Oral Q48H  . pantoprazole  40 mg Oral BID AC  . polyethylene glycol  17 g Oral Daily  . predniSONE  40 mg Oral Q breakfast  . rOPINIRole  1 mg Oral TID  . sodium chloride  10-40 mL Intracatheter Q12H  . tiotropium  18 mcg Inhalation Daily   Continuous Infusions: . heparin 900 Units/hr (01/30/15 1145)    Principal Problem:   Acute respiratory failure Active Problems:   Essential hypertension   COPD with acute bronchitis   HCAP (healthcare-associated pneumonia)   COPD exacerbation   Acute on chronic respiratory failure with  hypoxia   Atrial tachycardia   Hypokalemia   Chest pain   Elevated troponin    Time spent: 40 minutes.    Vernell Leep, MD, FACP, FHM. [facility] Pager [phone]  If 7PM-7AM, please contact night-coverage [email or web address] Password [email or web address] 01/30/2015, 2:29 PM    LOS: 4 days

## 2015-01-30 NOTE — Progress Notes (Signed)
ANTICOAGULATION CONSULT NOTE - Follow-up Consult  Pharmacy Consult for Heparin Indication: chest pain/ACS  Allergies  Allergen Reactions  . Doxycycline     REACTION: insomnia  . Fluoxetine   . Horizant [Gabapentin]   . Other     Bee stings - internal swelling per pt Bee stings - internal swelling per pt  . Penicillins     REACTION: shock  . Plavix [Clopidogrel]     Arm turned purple, rash  . Prozac [Fluoxetine Hcl]   . Sulfa Antibiotics   . Codeine Rash    Patient Measurements: Height: 5\' 1"  (154.9 cm) Weight: 128 lb 9.6 oz (58.333 kg) IBW/kg (Calculated) : 47.8  Heparin dosing wt: 58 kg  Vital Signs: Temp: 98 F (36.7 C) (07/04 2132) Temp Source: Oral (07/04 2132) BP: 136/63 mmHg (07/04 2132) Pulse Rate: 78 (07/04 2132)  Labs:  Recent Labs  01/28/15 0500 01/29/15 0625 01/30/15 0432 01/30/15 0730 01/30/15 2105  HGB 11.2*  --   --   --   --   HCT 34.6*  --   --   --   --   PLT 177  --   --   --   --   HEPARINUNFRC  --   --   --   --  1.82*  CREATININE 0.71 0.72 0.59  --   --   TROPONINI  --   --   --  0.22*  --     Estimated Creatinine Clearance: 46.8 mL/min (by C-G formula based on Cr of 0.59).  Assessment: 81 year old female with a complex medical history known to pharmacy from previous antibiotic dosing now to begin heparin for ACS in preparation for cath.   Heparin level 1.82 (supratherapeutic). No bleeding noted with RN. RN also confirmed that heparin level was drawn from opposite arm from where heparin is infusing.  Goal of Therapy:  Heparin level 0.3-0.7 units/ml Monitor platelets by anticoagulation protocol: Yes   Plan:  Hold heparin x 1 hour Decrease heparin drip to 700 units / hr F/u heparin level in 8 hours  Sherlon Handing, PharmD, BCPS Clinical pharmacist, pager [phone] 01/30/2015,10:50 PM

## 2015-01-30 NOTE — Progress Notes (Signed)
Cardiac catetherization / PCI video shown to patient and family.

## 2015-01-31 LAB — CULTURE, BLOOD (ROUTINE X 2)
CULTURE: NO GROWTH
Culture: NO GROWTH

## 2015-01-31 LAB — BASIC METABOLIC PANEL
Anion gap: 6 (ref 5–15)
BUN: 30 mg/dL — AB (ref 6–20)
CHLORIDE: 94 mmol/L — AB (ref 101–111)
CO2: 39 mmol/L — ABNORMAL HIGH (ref 22–32)
Calcium: 8.5 mg/dL — ABNORMAL LOW (ref 8.9–10.3)
Creatinine, Ser: 0.67 mg/dL (ref 0.44–1.00)
GLUCOSE: 148 mg/dL — AB (ref 65–99)
Potassium: 4.1 mmol/L (ref 3.5–5.1)
SODIUM: 139 mmol/L (ref 135–145)

## 2015-01-31 LAB — CULTURE, RESPIRATORY

## 2015-01-31 LAB — GLUCOSE, CAPILLARY
GLUCOSE-CAPILLARY: 118 mg/dL — AB (ref 65–99)
GLUCOSE-CAPILLARY: 114 mg/dL — AB (ref 65–99)
GLUCOSE-CAPILLARY: 239 mg/dL — AB (ref 65–99)
Glucose-Capillary: 191 mg/dL — ABNORMAL HIGH (ref 65–99)

## 2015-01-31 LAB — CBC
HCT: 34.8 % — ABNORMAL LOW (ref 36.0–46.0)
Hemoglobin: 11.4 g/dL — ABNORMAL LOW (ref 12.0–15.0)
MCH: 33.5 pg (ref 26.0–34.0)
MCHC: 32.8 g/dL (ref 30.0–36.0)
MCV: 102.4 fL — ABNORMAL HIGH (ref 78.0–100.0)
PLATELETS: 149 10*3/uL — AB (ref 150–400)
RBC: 3.4 MIL/uL — AB (ref 3.87–5.11)
RDW: 14.4 % (ref 11.5–15.5)
WBC: 9.9 10*3/uL (ref 4.0–10.5)

## 2015-01-31 LAB — CULTURE, RESPIRATORY W GRAM STAIN: Culture: NORMAL

## 2015-01-31 LAB — HEMOGLOBIN A1C
Hgb A1c MFr Bld: 6.4 % — ABNORMAL HIGH (ref 4.8–5.6)
Mean Plasma Glucose: 137 mg/dL

## 2015-01-31 LAB — HEPARIN LEVEL (UNFRACTIONATED): Heparin Unfractionated: 1.08 IU/mL — ABNORMAL HIGH (ref 0.30–0.70)

## 2015-01-31 MED ORDER — SODIUM CHLORIDE 0.9 % IJ SOLN: 3.0000 mL | INTRAMUSCULAR | Status: DC | PRN

## 2015-01-31 MED ORDER — SODIUM CHLORIDE 0.9 % IJ SOLN: 3.0000 mL | Freq: Two times a day (BID) | INTRAMUSCULAR | Status: DC

## 2015-01-31 MED ORDER — ASPIRIN 81 MG PO CHEW
81.0000 mg | CHEWABLE_TABLET | ORAL | Status: AC
  Administered 2015-02-01: 81 mg via ORAL
  Filled 2015-01-31: qty 1

## 2015-01-31 MED ORDER — SODIUM CHLORIDE 0.9 % IV SOLN
250.0000 mL | INTRAVENOUS | Status: DC | PRN
Start: 2015-01-31 — End: 2015-02-01

## 2015-01-31 MED ORDER — SODIUM CHLORIDE 0.9 % IV SOLN
Freq: Once | INTRAVENOUS | Status: AC
  Administered 2015-02-01: 04:00:00 via INTRAVENOUS

## 2015-01-31 MED ORDER — HEPARIN (PORCINE) IN NACL 100-0.45 UNIT/ML-% IJ SOLN
500.0000 [IU]/h | INTRAMUSCULAR | Status: DC
  Administered 2015-01-31: 500 [IU]/h via INTRAVENOUS
  Filled 2015-01-31 (×2): qty 250

## 2015-01-31 MED ORDER — TICAGRELOR 90 MG PO TABS
180.0000 mg | ORAL_TABLET | Freq: Once | ORAL | Status: AC
  Administered 2015-02-01: 180 mg via ORAL
  Filled 2015-01-31: qty 2

## 2015-01-31 NOTE — Progress Notes (Signed)
Patient Name: Tina [name] Date of Encounter: 01/31/2015  Primary Cardiologist:  Dr Tina [name] at [facility] in [place]   Principal Problem:   Acute respiratory failure Active Problems:   Essential hypertension   COPD with acute bronchitis   HCAP (healthcare-associated pneumonia)   COPD exacerbation   Acute on chronic respiratory failure with hypoxia   Atrial tachycardia   Hypokalemia   Chest pain   Elevated troponin    SUBJECTIVE  Has some mild chest pressure this morning.   CURRENT MEDS . antiseptic oral rinse  7 mL Mouth Rinse BID  . arformoterol  15 mcg Nebulization Q12H  . aspirin  325 mg Oral Daily  . atorvastatin  80 mg Oral QHS  . baclofen  10 mg Oral TID  . bisoprolol  2.5 mg Oral Daily  . budesonide (PULMICORT) nebulizer solution  0.25 mg Nebulization Daily  . cycloSPORINE  2 drop Both Eyes BID  . ferrous sulfate  325 mg Oral TID WC  . fluticasone  1 spray Each Nare Daily  . furosemide  40 mg Oral Daily  . guaiFENesin  1,200 mg Oral BID  . insulin aspart  0-9 Units Subcutaneous TID WC  . irbesartan  150 mg Oral Daily  . levofloxacin  750 mg Oral Q48H  . pantoprazole  40 mg Oral BID AC  . polyethylene glycol  17 g Oral Daily  . predniSONE  40 mg Oral Q breakfast  . rOPINIRole  1 mg Oral TID  . sodium chloride  10-40 mL Intracatheter Q12H  . tiotropium  18 mcg Inhalation Daily    OBJECTIVE  Filed Vitals:   01/30/15 2132 01/31/15 0514 01/31/15 0904 01/31/15 0930  BP: 136/63 149/61  128/58  Pulse: 78 77  72  Temp: 98 F (36.7 C) 98 F (36.7 C)    TempSrc: Oral Oral    Resp: 20 20  20   Height:      Weight:  127 lb 9.6 oz (57.879 kg)    SpO2: 100% 100% 99% 96%    Intake/Output Summary (Last 24 hours) at 01/31/15 0953 Last data filed at 01/31/15 0816  Gross per 24 hour  Intake 1085.35 ml  Output   1500 ml  Net -414.65 ml   Filed Weights   01/26/15 1641 01/30/15 0711 01/31/15 0514  Weight: 133 lb 8 oz (60.555 kg) 128 lb 9.6 oz (58.333  kg) 127 lb 9.6 oz (57.879 kg)    PHYSICAL EXAM  General: Pleasant, NAD. Neuro: Alert and oriented X 3. Moves all extremities spontaneously. Psych: Normal affect. HEENT:  Normal  Neck: Supple without bruits or JVD. Lungs:  Resp regular and unlabored, CTA with mild intermittent rhonchi. Heart: RRR no s3, s4, or murmurs. Abdomen: Soft, non-tender, non-distended, BS + x 4.  Extremities: DP/PT/Radials 2+ and equal bilaterally. Bilateral bruising in upper arms. 2+ pitting edema on the RLE, but no edema in RLE  Accessory Clinical Findings  CBC  Recent Labs  01/31/15 0555  WBC 9.9  HGB 11.4*  HCT 34.8*  MCV 102.4*  PLT 465*   Basic Metabolic Panel  Recent Labs  01/30/15 0432 01/31/15 0555  NA 140 139  K 3.5 4.1  CL 93* 94*  CO2 39* 39*  GLUCOSE 154* 148*  BUN 32* 30*  CREATININE 0.59 0.67  CALCIUM 8.7* 8.5*   Liver Function Tests No results for input(s): AST, ALT, ALKPHOS, BILITOT, PROT, ALBUMIN in the last 72 hours. No results for input(s): LIPASE, AMYLASE in the  last 72 hours. Cardiac Enzymes  Recent Labs  01/30/15 0730  TROPONINI 0.22*    TELE NSR this morning, some PVCs    ECG  No new EKG  Echocardiogram 01/27/2015  LV EF: 50% -  55%  ------------------------------------------------------------------- Indications:   COPD 496.  ------------------------------------------------------------------- History:  PMH: Elevated Troponin. Chronic obstructive pulmonary disease. PMH: Melanoma. Risk factors: Hypertension.  ------------------------------------------------------------------- Study Conclusions  - Left ventricle: The cavity size was normal. Wall thickness was increased in a pattern of mild LVH. Systolic function was low normal to mildly reduced. The estimated ejection fraction was in the range of 50% to 55%. Wall motion was normal; there were no regional wall motion abnormalities. Doppler parameters are consistent with  abnormal left ventricular relaxation (grade 1 diastolic dysfunction). - Aortic valve: There was no stenosis. There was mild regurgitation. - Mitral valve: Mildly calcified annulus. Mildly calcified leaflets . There was mild to moderate regurgitation. - Right ventricle: Poorly visualized. The cavity size was normal. Systolic function was normal. - Tricuspid valve: Peak RV-RA gradient (S): 30 mm Hg. - Pulmonary arteries: PA peak pressure: 45 mm Hg (S). - Systemic veins: IVC measured 2.3 cm with < 50% respirophasic variation, suggesting RA pressure 15 mmHg.  Impressions:  - Normal LV size with mild LV hypertrophy. EF 50-55%, low normal to mildly reduced systolic function. Normal RV size and systolic function. Mild to moderate MR. Mild pulmonary hypertension. Dilated IVC.    Radiology/Studies  Dg Chest 2 View  01/26/2015   CLINICAL DATA:  81 year old female with COPD and wheezing.  EXAM: CHEST  2 VIEW  COMPARISON:  Chest radiograph dated 12/18/2014  FINDINGS: Two views of the chest demonstrate emphysematous changes with mild interstitial prominence similar to prior study. No focal consolidation. No pleural effusion or pneumothorax. The osseous structures are grossly unremarkable. Upper lumbar posterior fixation screws noted.  IMPRESSION: Emphysema.  No consolidation or pneumothorax.   Electronically Signed   By: Anner Crete M.D.   On: 01/26/2015 17:30   Dg Chest Port 1 View  01/27/2015   CLINICAL DATA:  Tachycardia  EXAM: PORTABLE CHEST - 1 VIEW  COMPARISON:  01/26/2015  FINDINGS: There is mild chronic appearing interstitial coarsening. There is no airspace consolidation. There is no large effusion. There is no pneumothorax. Heart size is normal unchanged.  IMPRESSION: No acute cardiopulmonary findings.   Electronically Signed   By: Andreas Newport M.D.   On: 01/27/2015 04:07    ASSESSMENT AND PLAN  Tina [name] is a 81 y/o F with history of COPD, chronic respiratory  failure on O2, HTN, HLD, PAD s/p prior R SFA stenting (followed by [facility] - chronic RLE since that time) who was transferred to [facility] on 01/26/15 due to worsening respiratory status.  1. Acute on chronic respiratory failure in the setting of AECOPD and tracheobronchitis with baseline severe COPD   2. Chest pain with previous mildly elevated troponin this admission  - Chest pain was felt to be atypical however, pt had anterior ST/T changes concerning for underlying LAD dx  - requested record from patient's cardiologist Dr Tina [name] at [facility] in [place], did not see that it was done yesterday, will request again.    - pt does have allergy to plavix, pharmacy consulted, per pharmacist note on 7/4 10:56AM, "little cross-sensitivity across the three agents". Therefore, post cath, if need for antiplatelet therapy, would use Brilinta vs effient. Per pt, her arms were blood red after started on plavix, ?if what she described is actually  severe bruising, if so, the same will occur with either brilinta or effient.   -  Based on consult note, plan for cardiac catheterization, will arrange after discussing with MD this allergy toward plavix   3. Labile HTN  4. Multifocal atrial tachycardia, likely due to #1  5. PAD s/p LE stenting last year, not on Plavix due to skin reaction, soft right carotid bruit, can be followed as outpatient  6. Acute diastolic CHF, improved  7. Chronic RLE swelling (h/o R SFA on that side, denies any prior h/o DVT)  Signed, Almyra Deforest PA-C Pager: [phone] As above, patient seen and examined. She is not having chest pain today. Previous troponin and abnormal and a left echocardiogram with anterior T-wave inversion. Plan cardiac catheterization. She has an allergy to Plavix. She would therefore likely need brilinta; will review with interventional colleagues. Would most likely proceed with load today to see if she tolerates and then with cardiac catheterization tomorrow.  Hold lasix prior to the procedure. The risks and benefits were discussed and she agrees to proceed. Continue therapy for COPD/tracheobronchitis. Kirk Ruths

## 2015-01-31 NOTE — Progress Notes (Addendum)
PROGRESS NOTE    Tina [name] YYT:[id number] DOB: 1934-05-19 DOA: 01/26/2015 PCP: Lolita Patella, MD  Primary Pulmonologist: Dr. Asencion Noble. Primary Cardiologist: Dr. Jonathon Jordan in [facility].  HPI/Brief narrative 81 year old female patient with history of COPD, not on home oxygen until 2 weeks ago and since then has been on 2 L/m intermittently, former smoker-quit 20+ years ago, HTN, HLD, PAD status post right stents 2, chronic leg edema right >left, hard of hearing, was admitted on 6/27 to [facility] with RLL pneumonia and treated with IV vancomycin and levofloxacin. On 6/30, her condition worsened including tachycardia, acute respiratory failure, leukocytosis and worsening pneumonia. Family/patient requested transfer to [facility] so that her primary pulmonologist could evaluate her. Respiratory issues have improved and stabilized. Developed chest pain 7/4. Cardiology plans cardiac cath 7/6. Further disposition post cath.   Assessment/Plan:  Acute on chronic hypoxic respiratory failure - Secondary to AECOPD, community-acquired pneumonia (felt less likely) and possible Pulmonary edema - Admitted to stepdown unit for close monitoring and management - Treating empirically with broad-spectrum IV antibiotics: Vancomycin, levofloxacin & aztreonam. Vancomycin and aztreonam now discontinued. Continue levofloxacin and complete on 01/31/15. - Also treated with IV steroids, bronchodilator nebulizations, oxygen. - Repeat chest x-ray 01/27/15: No acute cardiopulmonary findings. - HIV antibodies: Nonreactive, pro-calcitonin: 9.37 >3.45, lactate normal, urine Legionella and pneumococcal antigen: Negative -Blood cultures 2 and urine culture times one: Negative to date - CCM consultation and follow-up appreciated: Low suspicion for CAP >narrowed antibiotics. BNP 849 > 1310, started IV Lasix 7/1. - Improved. - Continues to improve. Transferred to telemetry on 7/3.   Acute exacerbation of COPD -  Improved. Management as above - Pulmonary follow-up and recommendations appreciated. - Please see last pulmonary note 7/2 & 7/3 for discharge medication recommendations. - Outpatient follow-up with Dr. Joya Gaskins  Acute diastolic CHF - BNP 275 > 1700, started IV Lasix 7/1. - Continue IV Lasix. - - 6.7 L since admission. - Improved. Changed Lasix to by mouth.  Chest pain/elevated troponins - Early morning of 7/4, patient complained of right-sided chest pain, severe, radiating to right upper extremity, associated with headache, not relieved with sublingual NTG 2 but then relieved after breathing treatment. - Resolved - Initially had presented with elevated troponins which in the absence of chest pain were felt to be from demand ischemia. -  2-D echo: LVEF 50-55 percent and no wall motion abnormalities. May consider outpatient workup. - Cardiology consulted and are concerned regarding CAD. They are obtaining records from outpatient cardiologist, clarifying allergies and are considering cardiac cath this admission. - Troponin has trended down to 0.22. - Started on IV heparin by cardiology. Continue aspirin, statins, beta blockers. - Cardiac cath deferred to 7/6. Patient allergic to Plavix. Undergoing trial of Brilinta today.  ? Community-acquired pneumonia - Improved. Elevated pro-calcitonin-decreasing. Chest x-ray repeated and negative. - Antibiotic spectrum narrowed to oral levofloxacin-complete total 7 days treatment - Felt to be more infectious tracheobronchitis. - Blood cultures 2: Negative to date - Uterine culture: Normal oropharyngeal flora.  Anemia - Stable.  Thrombocytopenia  - Resolved   Hypertension - Mildly uncontrolled. Continue ARB (changed from Cozaar to Avapro).   HLD - Statins  Hard of hearing  PAD status post right lower extremity stents  Confusion/? Delirium - Likely secondary to acute illness. No focal deficits. Monitor - Resolved.  Paroxysmal multifocal  atrial tachycardia - Likely precipitated by acute lung issues. Reviewed telemetry with the cardiologist on 7/3. As per recommendations, started bisoprolol low-dose at 2.5 MG daily. -  Improved.  Hypokalemia - Secondary to diuresis. Improved.  Hyperglycemia - Check A1c: 6.4. Placed on SSI.   DVT prophylaxis: Changed to IV heparin Code Status: Full  Family Communication: None at bedside today. Disposition Plan: Pending medical stability and PT eval.? Home versus SNF.  Consultants:  CCM -signed off  Cardiology  Procedures:  2 D Echo 01/27/15: Study Conclusions  - Left ventricle: The cavity size was normal. Wall thickness was increased in a pattern of mild LVH. Systolic function was low normal to mildly reduced. The estimated ejection fraction was in the range of 50% to 55%. Wall motion was normal; there were no regional wall motion abnormalities. Doppler parameters are consistent with abnormal left ventricular relaxation (grade 1 diastolic dysfunction). - Aortic valve: There was no stenosis. There was mild regurgitation. - Mitral valve: Mildly calcified annulus. Mildly calcified leaflets . There was mild to moderate regurgitation. - Right ventricle: Poorly visualized. The cavity size was normal. Systolic function was normal. - Tricuspid valve: Peak RV-RA gradient (S): 30 mm Hg. - Pulmonary arteries: PA peak pressure: 45 mm Hg (S). - Systemic veins: IVC measured 2.3 cm with < 50% respirophasic variation, suggesting RA pressure 15 mmHg.  Impressions:  - Normal LV size with mild LV hypertrophy. EF 50-55%, low normal to mildly reduced systolic function. Normal RV size and systolic function. Mild to moderate MR. Mild pulmonary hypertension. Dilated IVC.  PICC line RUE 01/27/15  Foley catheter: DC'd 7/3  Antibiotics:  IV vancomycin 6/30 > DC'd  IV Aztreonam 6/30 > DC'd  Oral levofloxacin > 01/31/15  Subjective: No further chest pain. No  dyspnea. Had BM 7/5 AM  Objective: Filed Vitals:   01/31/15 0904 01/31/15 0930 01/31/15 1235 01/31/15 1254  BP:  128/58 111/51 117/62  Pulse:  72 81 78  Temp:   98.6 F (37 C) 98 F (36.7 C)  TempSrc:   Oral Oral  Resp:  20 18 18   Height:      Weight:      SpO2: 99% 96% 98% 98%    Intake/Output Summary (Last 24 hours) at 01/31/15 1408 Last data filed at 01/31/15 1300  Gross per 24 hour  Intake 1125.35 ml  Output   2700 ml  Net -1574.65 ml   Filed Weights   01/26/15 1641 01/30/15 0711 01/31/15 0514  Weight: 60.555 kg (133 lb 8 oz) 58.333 kg (128 lb 9.6 oz) 57.879 kg (127 lb 9.6 oz)     Exam:  General exam: Pleasant elderly female seen ambulating back from the bathroom with assistance and rolling walker. Respiratory system: clear to auscultation. No increased work of breathing. Cardiovascular system: S1 & S2 heard, RRR. No JVD, murmurs, gallops, clicks. Sinus rhythm. Trace leg edema right >left. Right leg apparently chronically larger than left since stenting procedure. Gastrointestinal system: Abdomen is nondistended, soft and nontender. Normal bowel sounds heard. Central nervous system: Alert and oriented. No focal neurological deficits. Extremities: Symmetric 5 x 5 power.Easy bruising of skin.   Data Reviewed: Basic Metabolic Panel:  Recent Labs Lab 01/27/15 0400 01/28/15 0500 01/29/15 0625 01/30/15 0432 01/31/15 0555  NA 146* 142 144 140 139  K 3.9 3.7 3.0* 3.5 4.1  CL 112* 100* 93* 93* 94*  CO2 24 32 42* 39* 39*  GLUCOSE 171* 199* 191* 154* 148*  BUN 21* 21* 23* 32* 30*  CREATININE 0.63 0.71 0.72 0.59 0.67  CALCIUM 9.0 9.1 8.9 8.7* 8.5*   Liver Function Tests:  Recent Labs Lab 01/26/15 2020  AST 21  ALT 33  ALKPHOS 87  BILITOT 0.5  PROT 5.3*  ALBUMIN 2.7*   No results for input(s): LIPASE, AMYLASE in the last 168 hours. No results for input(s): AMMONIA in the last 168 hours. CBC:  Recent Labs Lab 01/26/15 2020 01/27/15 0400  01/28/15 0500 01/31/15 0555  WBC 11.1* 8.4 8.9 9.9  NEUTROABS 10.5*  --   --   --   HGB 11.0* 10.4* 11.2* 11.4*  HCT 34.3* 31.7* 34.6* 34.8*  MCV 102.1* 101.3* 100.0 102.4*  PLT 152 143* 177 149*   Cardiac Enzymes:  Recent Labs Lab 01/26/15 2250 01/27/15 0400 01/27/15 1148 01/30/15 0730  TROPONINI 0.41* 0.40* 0.31* 0.22*   BNP (last 3 results) No results for input(s): PROBNP in the last 8760 hours. CBG:  Recent Labs Lab 01/30/15 1148 01/30/15 1643 01/30/15 2131 01/31/15 0610 01/31/15 1053  GLUCAP 170* 254* 193* 118* 114*    Recent Results (from the past 240 hour(s))  MRSA PCR Screening     Status: Abnormal   Collection Time: 01/26/15  5:03 PM  Result Value Ref Range Status   MRSA by PCR POSITIVE (A) NEGATIVE Final    Comment:        The GeneXpert MRSA Assay (FDA approved for NASAL specimens only), is one component of a comprehensive MRSA colonization surveillance program. It is not intended to diagnose MRSA infection nor to guide or monitor treatment for MRSA infections. RESULT CALLED TO, READ BACK BY AND VERIFIED WITH: Tyson Babinski RN [id number] 01/26/15 A BROWNING   Urine culture     Status: None   Collection Time: 01/26/15  6:21 PM  Result Value Ref Range Status   Specimen Description URINE, CATHETERIZED  Final   Special Requests NONE  Final   Culture NO GROWTH 2 DAYS  Final   Report Status 01/28/2015 FINAL  Final  Culture, blood (routine x 2)     Status: None (Preliminary result)   Collection Time: 01/26/15  8:15 PM  Result Value Ref Range Status   Specimen Description BLOOD LEFT ARM  Final   Special Requests IN PEDIATRIC BOTTLE 3CC  Final   Culture NO GROWTH 4 DAYS  Final   Report Status PENDING  Incomplete  Culture, blood (routine x 2)     Status: None (Preliminary result)   Collection Time: 01/26/15  8:20 PM  Result Value Ref Range Status   Specimen Description BLOOD LEFT ARM  Final   Special Requests BOTTLES DRAWN AEROBIC ONLY 5CC  Final   Culture  NO GROWTH 4 DAYS  Final   Report Status PENDING  Incomplete  Culture, sputum-assessment     Status: None   Collection Time: 01/29/15  5:28 AM  Result Value Ref Range Status   Specimen Description SPUTUM  Final   Special Requests NONE  Final   Sputum evaluation   Final    THIS SPECIMEN IS ACCEPTABLE. RESPIRATORY CULTURE REPORT TO FOLLOW.   Report Status 01/29/2015 FINAL  Final  Culture, respiratory (NON-Expectorated)     Status: None   Collection Time: 01/29/15  5:28 AM  Result Value Ref Range Status   Specimen Description SPUTUM  Final   Special Requests NONE  Final   Gram Stain   Final    RARE WBC PRESENT,BOTH PMN AND MONONUCLEAR FEW SQUAMOUS EPITHELIAL CELLS PRESENT FEW GRAM POSITIVE COCCI IN PAIRS Performed at [facility]    Culture   Final    NORMAL OROPHARYNGEAL FLORA Performed at [facility]    Report  Status 01/31/2015 FINAL  Final      Studies: No results found.      Scheduled Meds: . antiseptic oral rinse  7 mL Mouth Rinse BID  . arformoterol  15 mcg Nebulization Q12H  . aspirin  325 mg Oral Daily  . atorvastatin  80 mg Oral QHS  . baclofen  10 mg Oral TID  . bisoprolol  2.5 mg Oral Daily  . budesonide (PULMICORT) nebulizer solution  0.25 mg Nebulization Daily  . cycloSPORINE  2 drop Both Eyes BID  . ferrous sulfate  325 mg Oral TID WC  . fluticasone  1 spray Each Nare Daily  . guaiFENesin  1,200 mg Oral BID  . insulin aspart  0-9 Units Subcutaneous TID WC  . irbesartan  150 mg Oral Daily  . levofloxacin  750 mg Oral Q48H  . pantoprazole  40 mg Oral BID AC  . polyethylene glycol  17 g Oral Daily  . predniSONE  40 mg Oral Q breakfast  . rOPINIRole  1 mg Oral TID  . sodium chloride  10-40 mL Intracatheter Q12H  . tiotropium  18 mcg Inhalation Daily   Continuous Infusions: . heparin 700 Units/hr (01/31/15 0000)    Principal Problem:   Acute respiratory failure Active Problems:   Essential hypertension   COPD with acute  bronchitis   HCAP (healthcare-associated pneumonia)   COPD exacerbation   Acute on chronic respiratory failure with hypoxia   Atrial tachycardia   Hypokalemia   Chest pain   Elevated troponin    Time spent: 20 minutes.    Vernell Leep, MD, FACP, FHM. [facility] Pager [phone]  If 7PM-7AM, please contact night-coverage [email or web address] Password [email or web address] 01/31/2015, 2:08 PM    LOS: 5 days

## 2015-01-31 NOTE — Progress Notes (Signed)
Patient on schedule for cath by Dr. Doug Sou tomorrow. NPO past midnight. I have discussed with Dr. Martinique potential need for a trial of brilinta to today to assess allergic reaction before tomorrow's procedure. Per pharmacy, little cross reactivity between plavix and brilinta.   Pre cath order placed. Per Dr. Stanford Breed, will limit amount of fluid hydration before cath as she came in with diastolic HF  Signed, Almyra Deforest PA Pager: [phone]

## 2015-01-31 NOTE — Progress Notes (Signed)
ANTICOAGULATION CONSULT NOTE - Follow-up Consult  Pharmacy Consult for Heparin Indication: chest pain/ACS  Allergies  Allergen Reactions  . Doxycycline     REACTION: insomnia  . Fluoxetine   . Horizant [Gabapentin]   . Other     Bee stings - internal swelling per pt Bee stings - internal swelling per pt  . Penicillins     REACTION: shock  . Plavix [Clopidogrel]     Arm turned purple, rash  . Prozac [Fluoxetine Hcl]   . Sulfa Antibiotics   . Codeine Rash    Patient Measurements: Height: 5\' 1"  (154.9 cm) Weight: 127 lb 9.6 oz (57.879 kg) IBW/kg (Calculated) : 47.8  Heparin dosing wt: 58 kg  Vital Signs: Temp: 98 F (36.7 C) (07/05 1254) Temp Source: Oral (07/05 1254) BP: 117/62 mmHg (07/05 1254) Pulse Rate: 78 (07/05 1254)  Labs:  Recent Labs  01/29/15 0625 01/30/15 0432 01/30/15 0730 01/30/15 2105 01/31/15 0555  HGB  --   --   --   --  11.4*  HCT  --   --   --   --  34.8*  PLT  --   --   --   --  149*  HEPARINUNFRC  --   --   --  1.82*  --   CREATININE 0.72 0.59  --   --  0.67  TROPONINI  --   --  0.22*  --   --     Estimated Creatinine Clearance: 46.6 mL/min (by C-G formula based on Cr of 0.67).  Assessment: 81 year old female with a complex medical history known to pharmacy from previous antibiotic dosing now to begin heparin for ACS in preparation for cath.   Heparin level is SUPRAtherapeutic at 1.08.  Per lab, issues with processing however this lab appears within what would be expected.  . No bleeding noted with RN.  CBC stable but plt dropped slightly 177 > 149.    Goal of Therapy:  Heparin level 0.3-0.7 units/ml Monitor platelets by anticoagulation protocol: Yes   Plan:  Hold heparin x 1 hour Decrease heparin drip to 500 units / hr F/u heparin level in 8 hours F/U cath plans  Hassie Bruce, Pharm. D. Clinical Pharmacy Resident Pager: [phone] Ph: [phone] 01/31/2015 1:09 PM

## 2015-02-01 ENCOUNTER — Encounter (HOSPITAL_COMMUNITY)
Admission: AD | Disposition: A | Discharge status: Home Health | Payer: Self-pay | Source: Other Acute Inpatient Hospital | Attending: Internal Medicine

## 2015-02-01 DIAGNOSIS — J189 Pneumonia, unspecified organism: Secondary | ICD-10-CM

## 2015-02-01 DIAGNOSIS — J9621 Acute and chronic respiratory failure with hypoxia: Secondary | ICD-10-CM

## 2015-02-01 DIAGNOSIS — J441 Chronic obstructive pulmonary disease with (acute) exacerbation: Secondary | ICD-10-CM

## 2015-02-01 DIAGNOSIS — I2510 Atherosclerotic heart disease of native coronary artery without angina pectoris: Secondary | ICD-10-CM

## 2015-02-01 DIAGNOSIS — R072 Precordial pain: Secondary | ICD-10-CM

## 2015-02-01 HISTORY — PX: CARDIAC CATHETERIZATION: SHX172

## 2015-02-01 LAB — CBC
HCT: 33.9 % — ABNORMAL LOW (ref 36.0–46.0)
Hemoglobin: 10.8 g/dL — ABNORMAL LOW (ref 12.0–15.0)
MCH: 32.9 pg (ref 26.0–34.0)
MCHC: 31.9 g/dL (ref 30.0–36.0)
MCV: 103.4 fL — ABNORMAL HIGH (ref 78.0–100.0)
PLATELETS: 151 10*3/uL (ref 150–400)
RBC: 3.28 MIL/uL — ABNORMAL LOW (ref 3.87–5.11)
RDW: 14.7 % (ref 11.5–15.5)
WBC: 8.3 10*3/uL (ref 4.0–10.5)

## 2015-02-01 LAB — BASIC METABOLIC PANEL
Anion gap: 5 (ref 5–15)
BUN: 23 mg/dL — AB (ref 6–20)
CHLORIDE: 97 mmol/L — AB (ref 101–111)
CO2: 39 mmol/L — ABNORMAL HIGH (ref 22–32)
Calcium: 8.7 mg/dL — ABNORMAL LOW (ref 8.9–10.3)
Creatinine, Ser: 0.72 mg/dL (ref 0.44–1.00)
GFR calc Af Amer: >60 mL/min (ref >60)
Glucose, Bld: 162 mg/dL — ABNORMAL HIGH (ref 65–99)
POTASSIUM: 4.1 mmol/L (ref 3.5–5.1)
SODIUM: 141 mmol/L (ref 135–145)

## 2015-02-01 LAB — POCT ACTIVATED CLOTTING TIME: Activated Clotting Time: 116 seconds

## 2015-02-01 LAB — PROTIME-INR
INR: 1.09 (ref 0.00–1.49)
PROTHROMBIN TIME: 14.3 s (ref 11.6–15.2)

## 2015-02-01 LAB — GLUCOSE, CAPILLARY
GLUCOSE-CAPILLARY: 122 mg/dL — AB (ref 65–99)
GLUCOSE-CAPILLARY: 143 mg/dL — AB (ref 65–99)
Glucose-Capillary: 197 mg/dL — ABNORMAL HIGH (ref 65–99)

## 2015-02-01 LAB — HEPARIN LEVEL (UNFRACTIONATED)
HEPARIN UNFRACTIONATED: 0.48 [IU]/mL (ref 0.30–0.70)
HEPARIN UNFRACTIONATED: 0.38 [IU]/mL (ref 0.30–0.70)

## 2015-02-01 SURGERY — LEFT HEART CATH AND CORONARY ANGIOGRAPHY
Anesthesia: LOCAL

## 2015-02-01 MED ORDER — SODIUM CHLORIDE 0.9 % IJ SOLN
3.0000 mL | Freq: Two times a day (BID) | INTRAMUSCULAR | Status: DC
  Administered 2015-02-02: 3 mL via INTRAVENOUS

## 2015-02-01 MED ORDER — FENTANYL CITRATE (PF) 100 MCG/2ML IJ SOLN
INTRAMUSCULAR | Status: DC | PRN
Start: 2015-02-01 — End: 2015-02-01
  Administered 2015-02-01: 25 ug via INTRAVENOUS

## 2015-02-01 MED ORDER — MIDAZOLAM HCL 2 MG/2ML IJ SOLN
INTRAMUSCULAR | Status: AC
  Filled 2015-02-01: qty 2

## 2015-02-01 MED ORDER — ASPIRIN 81 MG PO CHEW
81.0000 mg | CHEWABLE_TABLET | Freq: Every day | ORAL | Status: DC
  Administered 2015-02-02: 81 mg via ORAL
  Filled 2015-02-01: qty 1

## 2015-02-01 MED ORDER — MIDAZOLAM HCL 2 MG/2ML IJ SOLN
INTRAMUSCULAR | Status: DC | PRN
  Administered 2015-02-01: 1 mg via INTRAVENOUS

## 2015-02-01 MED ORDER — FENTANYL CITRATE (PF) 100 MCG/2ML IJ SOLN
INTRAMUSCULAR | Status: AC
  Filled 2015-02-01: qty 2

## 2015-02-01 MED ORDER — TICAGRELOR 90 MG PO TABS
90.0000 mg | ORAL_TABLET | Freq: Two times a day (BID) | ORAL | Status: DC
  Administered 2015-02-01: 90 mg via ORAL
  Filled 2015-02-01 (×3): qty 1

## 2015-02-01 MED ORDER — IOHEXOL 350 MG/ML SOLN
INTRAVENOUS | Status: DC | PRN
  Administered 2015-02-01: 75 mL via INTRA_ARTERIAL

## 2015-02-01 MED ORDER — SODIUM CHLORIDE 0.9 % IV SOLN: 250.0000 mL | INTRAVENOUS | Status: DC | PRN

## 2015-02-01 MED ORDER — HEPARIN SODIUM (PORCINE) 5000 UNIT/ML IJ SOLN
5000.0000 [IU] | Freq: Three times a day (TID) | INTRAMUSCULAR | Status: DC
  Administered 2015-02-02: 5000 [IU] via SUBCUTANEOUS
  Filled 2015-02-01 (×4): qty 1

## 2015-02-01 MED ORDER — LIDOCAINE HCL (PF) 1 % IJ SOLN
INTRAMUSCULAR | Status: DC | PRN
  Administered 2015-02-01: 8 mL

## 2015-02-01 MED ORDER — SODIUM CHLORIDE 0.9 % IJ SOLN: 3.0000 mL | INTRAMUSCULAR | Status: DC | PRN

## 2015-02-01 MED ORDER — SODIUM CHLORIDE 0.9 % WEIGHT BASED INFUSION
3.0000 mL/kg/h | INTRAVENOUS | Status: AC
Start: 2015-02-01 — End: 2015-02-01
  Administered 2015-02-01: 3 mL/kg/h via INTRAVENOUS

## 2015-02-01 SURGICAL SUPPLY — 8 items
CATH INFINITI 5FR MULTPACK ANG (CATHETERS) ×1 IMPLANT
KIT HEART LEFT (KITS) ×2 IMPLANT
PACK CARDIAC CATHETERIZATION (CUSTOM PROCEDURE TRAY) ×2 IMPLANT
SHEATH PINNACLE 5F 10CM (SHEATH) ×1 IMPLANT
SYR MEDRAD MARK V 150ML (SYRINGE) ×2 IMPLANT
TRANSDUCER W/STOPCOCK (MISCELLANEOUS) ×2 IMPLANT
WIRE EMERALD 3MM-J .035X150CM (WIRE) ×1 IMPLANT
WIRE HI TORQ VERSACORE-J 145CM (WIRE) ×1 IMPLANT

## 2015-02-01 NOTE — Interval H&P Note (Signed)
History and Physical Interval Note:  02/01/2015 1:50 PM  Tina [name]  has presented today for surgery, with the diagnosis of cp, ekg changes  The various methods of treatment have been discussed with the patient and family. After consideration of risks, benefits and other options for treatment, the patient has consented to  Procedure(s): Left Heart Cath and Coronary Angiography (N/A) as a surgical intervention .  The patient's history has been reviewed, patient examined, no change in status, stable for surgery.  I have reviewed the patient's chart and labs.  Questions were answered to the patient's satisfaction.   Cath Lab Visit (complete for each Cath Lab visit)  Clinical Evaluation Leading to the Procedure:   ACS: Yes.    Non-ACS:    Anginal Classification: CCS IV  Anti-ischemic medical therapy: No Therapy  Non-Invasive Test Results: No non-invasive testing performed  Prior CABG: No previous CABG        Collier Salina [facility] 02/01/2015 1:50 PM

## 2015-02-01 NOTE — Progress Notes (Signed)
Occupational Therapy Treatment Patient Details Name: Tina [name] MRN: [id number] DOB: 1933-12-12 Today's Date: 02/01/2015    History of present illness 81 year old female with COPD (PW patient) was admitted in [facility] for AECOPD and CAP on 6/27. 6/30 her WBC and PNA were noted to be worsening. She requested transfer to [facility] Scheduled for cardiac cath 7/5?   OT comments  Pt anxious regarding procedure  Follow Up Recommendations  Home health OT;Supervision/Assistance - 24 hour    Equipment Recommendations  None recommended by OT    Recommendations for Other Services      Precautions / Restrictions Precautions Precautions: Fall       Mobility Bed Mobility Overal bed mobility: Needs Assistance Bed Mobility: Supine to Sit              Transfers Overall transfer level: Needs assistance Equipment used: Rolling walker (2 wheeled)   Sit to Stand: Min guard Stand pivot transfers: Min guard            Balance                                   ADL Overall ADL's : Needs assistance/impaired                         Toilet Transfer: Min guard;Ambulation;RW;BSC   Toileting- Water quality scientist and Hygiene: Supervision/safety;Sit to/from stand       Functional mobility during ADLs: Minimal assistance General ADL Comments: VC for hand placement. Pt able to stand for 7 minutes 2 times while looking at pics on her phone.  Vc to widen BOS.  Pt anxious about procedure                Cognition   Behavior During Therapy: [facility] for tasks assessed/performed Overall Cognitive Status: Within Functional Limits for tasks assessed                                    Pertinent Vitals/ Pain       Pain Assessment: No/denies pain         Frequency       Progress Toward Goals  OT Goals(current goals can now be found in the care plan section)  Progress towards OT goals: Progressing toward goals            End of Session      Activity Tolerance Patient tolerated treatment well   Patient Left in bed;with call bell/phone within reach;with family/visitor present   Nurse Communication Mobility status        Time: 1040-1104 OT Time Calculation (min): 24 min  Charges: OT General Charges $OT Visit: 1 Procedure OT Treatments $Self Care/Home Management : 23-37 mins  Evadne Ose D 02/01/2015, 11:13 AM

## 2015-02-01 NOTE — Progress Notes (Signed)
Patient Name: Tina [name] Date of Encounter: 02/01/2015  Primary Cardiologist:  Dr Tina [name] at [facility] in [place] about procedure; dyspnea earlier now improved; no chest pain  CURRENT MEDS . antiseptic oral rinse  7 mL Mouth Rinse BID  . arformoterol  15 mcg Nebulization Q12H  . aspirin  325 mg Oral Daily  . atorvastatin  80 mg Oral QHS  . baclofen  10 mg Oral TID  . bisoprolol  2.5 mg Oral Daily  . budesonide (PULMICORT) nebulizer solution  0.25 mg Nebulization Daily  . cycloSPORINE  2 drop Both Eyes BID  . ferrous sulfate  325 mg Oral TID WC  . fluticasone  1 spray Each Nare Daily  . guaiFENesin  1,200 mg Oral BID  . insulin aspart  0-9 Units Subcutaneous TID WC  . irbesartan  150 mg Oral Daily  . levofloxacin  750 mg Oral Q48H  . pantoprazole  40 mg Oral BID AC  . polyethylene glycol  17 g Oral Daily  . predniSONE  40 mg Oral Q breakfast  . rOPINIRole  1 mg Oral TID  . sodium chloride  10-40 mL Intracatheter Q12H  . sodium chloride  3 mL Intravenous Q12H  . ticagrelor  90 mg Oral BID  . tiotropium  18 mcg Inhalation Daily    OBJECTIVE  Filed Vitals:   01/31/15 2216 02/01/15 0647 02/01/15 0821 02/01/15 0827  BP: 135/54 137/44    Pulse: 71 74    Temp: 98.2 F (36.8 C) 98 F (36.7 C)    TempSrc: Oral Oral    Resp: 18 17    Height:      Weight:  133 lb 12.8 oz (60.691 kg)    SpO2: 100% 100% 100% 100%    Intake/Output Summary (Last 24 hours) at 02/01/15 1106 Last data filed at 02/01/15 0600  Gross per 24 hour  Intake    660 ml  Output   1300 ml  Net   -640 ml   Filed Weights   01/31/15 0514 01/31/15 1541 02/01/15 0647  Weight: 127 lb 9.6 oz (57.879 kg) 127 lb 9.6 oz (57.879 kg) 133 lb 12.8 oz (60.691 kg)    PHYSICAL EXAM  General: Pleasant, frail, NAD. Neuro: Alert and oriented X 3. Moves all extremities spontaneously. HEENT:  Normal  Neck: Supple Lungs:  Diminished BS throughout Heart: RRR Abdomen: Soft,  non-tender, non-distended, no masses Extremities: diffuse ecchymoses; 1 + edema RLE (chronic)  Accessory Clinical Findings  CBC  Recent Labs  01/31/15 0555 02/01/15 0440  WBC 9.9 8.3  HGB 11.4* 10.8*  HCT 34.8* 33.9*  MCV 102.4* 103.4*  PLT 149* 768   Basic Metabolic Panel  Recent Labs  01/31/15 0555 02/01/15 0440  NA 139 141  K 4.1 4.1  CL 94* 97*  CO2 39* 39*  GLUCOSE 148* 162*  BUN 30* 23*  CREATININE 0.67 0.72  CALCIUM 8.5* 8.7*   Cardiac Enzymes  Recent Labs  01/30/15 0730  TROPONINI 0.22*    Echocardiogram 01/27/2015  LV EF: 50% -  55%  ------------------------------------------------------------------- Indications:   COPD 496.  ------------------------------------------------------------------- History:  PMH: Elevated Troponin. Chronic obstructive pulmonary disease. PMH: Melanoma. Risk factors: Hypertension.  ------------------------------------------------------------------- Study Conclusions  - Left ventricle: The cavity size was normal. Wall thickness was increased in a pattern of mild LVH. Systolic function was low normal to mildly reduced. The estimated ejection fraction was in the range of 50% to 55%. Wall motion  was normal; there were no regional wall motion abnormalities. Doppler parameters are consistent with abnormal left ventricular relaxation (grade 1 diastolic dysfunction). - Aortic valve: There was no stenosis. There was mild regurgitation. - Mitral valve: Mildly calcified annulus. Mildly calcified leaflets . There was mild to moderate regurgitation. - Right ventricle: Poorly visualized. The cavity size was normal. Systolic function was normal. - Tricuspid valve: Peak RV-RA gradient (S): 30 mm Hg. - Pulmonary arteries: PA peak pressure: 45 mm Hg (S). - Systemic veins: IVC measured 2.3 cm with < 50% respirophasic variation, suggesting RA pressure 15 mmHg.  Impressions:  - Normal LV size with  mild LV hypertrophy. EF 50-55%, low normal to mildly reduced systolic function. Normal RV size and systolic function. Mild to moderate MR. Mild pulmonary hypertension. Dilated IVC.    Radiology/Studies  Dg Chest 2 View  01/26/2015   CLINICAL DATA:  81 year old female with COPD and wheezing.  EXAM: CHEST  2 VIEW  COMPARISON:  Chest radiograph dated 12/18/2014  FINDINGS: Two views of the chest demonstrate emphysematous changes with mild interstitial prominence similar to prior study. No focal consolidation. No pleural effusion or pneumothorax. The osseous structures are grossly unremarkable. Upper lumbar posterior fixation screws noted.  IMPRESSION: Emphysema.  No consolidation or pneumothorax.   Electronically Signed   By: Anner Crete M.D.   On: 01/26/2015 17:30   Dg Chest Port 1 View  01/27/2015   CLINICAL DATA:  Tachycardia  EXAM: PORTABLE CHEST - 1 VIEW  COMPARISON:  01/26/2015  FINDINGS: There is mild chronic appearing interstitial coarsening. There is no airspace consolidation. There is no large effusion. There is no pneumothorax. Heart size is normal unchanged.  IMPRESSION: No acute cardiopulmonary findings.   Electronically Signed   By: Andreas Newport M.D.   On: 01/27/2015 04:07    ASSESSMENT AND PLAN  Tina [name] is a 81 y/o F with history of COPD, chronic respiratory failure on O2, HTN, HLD, PAD s/p prior R SFA stenting (followed by Tina [name] - chronic RLE since that time) who was transferred to [name] on 01/26/15 due to worsening respiratory status.  1. Acute on chronic respiratory failure in the setting of AECOPD and tracheobronchitis with baseline severe COPD   - continue steroids, bronchodilators and antibiotics; management per primary care.  2. Chest pain with previous mildly elevated troponin this admission  - Chest pain was felt to be atypical however, pt had anterior ST/T changes concerning for underlying LAD dx and abnormal troponin; echo shows low normal LV  function.  - Continue ASA, brilinta (patient appears to be tolerating at this point-note plavix allergy); statin, beta blocker and heparin; for cath today.  3. HTN  - Controlled; continue present medications.  4. Multifocal atrial tachycardia, likely due to #1  5. PAD s/p LE stenting last year, not on Plavix due to skin reaction, soft right carotid bruit, can be followed as outpatient  6. Acute diastolic CHF, improved; resume low-dose diaphoretic following catheterization.  7. Chronic RLE swelling (chronic; h/o R SFA on that side, denies any prior h/o DVT)  Signed, Kirk Ruths MD

## 2015-02-01 NOTE — H&P (View-Only) (Signed)
Patient Name: Tina [name] Date of Encounter: 02/01/2015  Primary Cardiologist:  Dr Elonda Husky at [facility] in [place] about procedure; dyspnea earlier now improved; no chest pain  CURRENT MEDS . antiseptic oral rinse  7 mL Mouth Rinse BID  . arformoterol  15 mcg Nebulization Q12H  . aspirin  325 mg Oral Daily  . atorvastatin  80 mg Oral QHS  . baclofen  10 mg Oral TID  . bisoprolol  2.5 mg Oral Daily  . budesonide (PULMICORT) nebulizer solution  0.25 mg Nebulization Daily  . cycloSPORINE  2 drop Both Eyes BID  . ferrous sulfate  325 mg Oral TID WC  . fluticasone  1 spray Each Nare Daily  . guaiFENesin  1,200 mg Oral BID  . insulin aspart  0-9 Units Subcutaneous TID WC  . irbesartan  150 mg Oral Daily  . levofloxacin  750 mg Oral Q48H  . pantoprazole  40 mg Oral BID AC  . polyethylene glycol  17 g Oral Daily  . predniSONE  40 mg Oral Q breakfast  . rOPINIRole  1 mg Oral TID  . sodium chloride  10-40 mL Intracatheter Q12H  . sodium chloride  3 mL Intravenous Q12H  . ticagrelor  90 mg Oral BID  . tiotropium  18 mcg Inhalation Daily    OBJECTIVE  Filed Vitals:   01/31/15 2216 02/01/15 0647 02/01/15 0821 02/01/15 0827  BP: 135/54 137/44    Pulse: 71 74    Temp: 98.2 F (36.8 C) 98 F (36.7 C)    TempSrc: Oral Oral    Resp: 18 17    Height:      Weight:  133 lb 12.8 oz (60.691 kg)    SpO2: 100% 100% 100% 100%    Intake/Output Summary (Last 24 hours) at 02/01/15 1106 Last data filed at 02/01/15 0600  Gross per 24 hour  Intake    660 ml  Output   1300 ml  Net   -640 ml   Filed Weights   01/31/15 0514 01/31/15 1541 02/01/15 0647  Weight: 127 lb 9.6 oz (57.879 kg) 127 lb 9.6 oz (57.879 kg) 133 lb 12.8 oz (60.691 kg)    PHYSICAL EXAM  General: Pleasant, frail, NAD. Neuro: Alert and oriented X 3. Moves all extremities spontaneously. HEENT:  Normal  Neck: Supple Lungs:  Diminished BS throughout Heart: RRR Abdomen: Soft,  non-tender, non-distended, no masses Extremities: diffuse ecchymoses; 1 + edema RLE (chronic)  Accessory Clinical Findings  CBC  Recent Labs  01/31/15 0555 02/01/15 0440  WBC 9.9 8.3  HGB 11.4* 10.8*  HCT 34.8* 33.9*  MCV 102.4* 103.4*  PLT 149* 427   Basic Metabolic Panel  Recent Labs  01/31/15 0555 02/01/15 0440  NA 139 141  K 4.1 4.1  CL 94* 97*  CO2 39* 39*  GLUCOSE 148* 162*  BUN 30* 23*  CREATININE 0.67 0.72  CALCIUM 8.5* 8.7*   Cardiac Enzymes  Recent Labs  01/30/15 0730  TROPONINI 0.22*    Echocardiogram 01/27/2015  LV EF: 50% -  55%  ------------------------------------------------------------------- Indications:   COPD 496.  ------------------------------------------------------------------- History:  PMH: Elevated Troponin. Chronic obstructive pulmonary disease. PMH: Melanoma. Risk factors: Hypertension.  ------------------------------------------------------------------- Study Conclusions  - Left ventricle: The cavity size was normal. Wall thickness was increased in a pattern of mild LVH. Systolic function was low normal to mildly reduced. The estimated ejection fraction was in the range of 50% to 55%. Wall motion  was normal; there were no regional wall motion abnormalities. Doppler parameters are consistent with abnormal left ventricular relaxation (grade 1 diastolic dysfunction). - Aortic valve: There was no stenosis. There was mild regurgitation. - Mitral valve: Mildly calcified annulus. Mildly calcified leaflets . There was mild to moderate regurgitation. - Right ventricle: Poorly visualized. The cavity size was normal. Systolic function was normal. - Tricuspid valve: Peak RV-RA gradient (S): 30 mm Hg. - Pulmonary arteries: PA peak pressure: 45 mm Hg (S). - Systemic veins: IVC measured 2.3 cm with < 50% respirophasic variation, suggesting RA pressure 15 mmHg.  Impressions:  - Normal LV size with  mild LV hypertrophy. EF 50-55%, low normal to mildly reduced systolic function. Normal RV size and systolic function. Mild to moderate MR. Mild pulmonary hypertension. Dilated IVC.    Radiology/Studies  Dg Chest 2 View  01/26/2015   CLINICAL DATA:  81 year old female with COPD and wheezing.  EXAM: CHEST  2 VIEW  COMPARISON:  Chest radiograph dated 12/18/2014  FINDINGS: Two views of the chest demonstrate emphysematous changes with mild interstitial prominence similar to prior study. No focal consolidation. No pleural effusion or pneumothorax. The osseous structures are grossly unremarkable. Upper lumbar posterior fixation screws noted.  IMPRESSION: Emphysema.  No consolidation or pneumothorax.   Electronically Signed   By: Anner Crete M.D.   On: 01/26/2015 17:30   Dg Chest Port 1 View  01/27/2015   CLINICAL DATA:  Tachycardia  EXAM: PORTABLE CHEST - 1 VIEW  COMPARISON:  01/26/2015  FINDINGS: There is mild chronic appearing interstitial coarsening. There is no airspace consolidation. There is no large effusion. There is no pneumothorax. Heart size is normal unchanged.  IMPRESSION: No acute cardiopulmonary findings.   Electronically Signed   By: Andreas Newport M.D.   On: 01/27/2015 04:07    ASSESSMENT AND PLAN  Ms. Bowler is a 81 y/o F with history of COPD, chronic respiratory failure on O2, HTN, HLD, PAD s/p prior R SFA stenting (followed by [facility] - chronic RLE since that time) who was transferred to [facility] on 01/26/15 due to worsening respiratory status.  1. Acute on chronic respiratory failure in the setting of AECOPD and tracheobronchitis with baseline severe COPD   - continue steroids, bronchodilators and antibiotics; management per primary care.  2. Chest pain with previous mildly elevated troponin this admission  - Chest pain was felt to be atypical however, pt had anterior ST/T changes concerning for underlying LAD dx and abnormal troponin; echo shows low normal LV  function.  - Continue ASA, brilinta (patient appears to be tolerating at this point-note plavix allergy); statin, beta blocker and heparin; for cath today.  3. HTN  - Controlled; continue present medications.  4. Multifocal atrial tachycardia, likely due to #1  5. PAD s/p LE stenting last year, not on Plavix due to skin reaction, soft right carotid bruit, can be followed as outpatient  6. Acute diastolic CHF, improved; resume low-dose diaphoretic following catheterization.  7. Chronic RLE swelling (chronic; h/o R SFA on that side, denies any prior h/o DVT)  Signed, Kirk Ruths MD

## 2015-02-01 NOTE — Progress Notes (Signed)
PROGRESS NOTE    Tina [name] RSW:[id number] DOB: 1933-08-16 DOA: 01/26/2015 PCP: Lolita Patella, MD  Primary Pulmonologist: Dr. Asencion Noble. Primary Cardiologist: Dr. Jonathon Jordan in [facility].  HPI/Brief narrative 81 year old female patient with history of COPD, not on home oxygen until 2 weeks ago and since then has been on 2 L/m intermittently, former smoker-quit 20+ years ago, HTN, HLD, PAD status post right stents 2, chronic leg edema right >left, hard of hearing, was admitted on 6/27 to [facility] with RLL pneumonia and treated with IV vancomycin and levofloxacin. On 6/30, her condition worsened including tachycardia, acute respiratory failure, leukocytosis and worsening pneumonia. Family/patient requested transfer to [facility] so that her primary pulmonologist could evaluate her. Respiratory issues have improved and stabilized. Developed chest pain 7/4. Cardiology plans cardiac cath 7/6. Further disposition post cath.   Assessment/Plan:  Acute on chronic hypoxic respiratory failure - Secondary to AECOPD, community-acquired pneumonia (felt less likely) and possible Pulmonary edemat - Treating empirically with broad-spectrum IV antibiotics: Vancomycin, levofloxacin & aztreonam. Vancomycin and aztreonam now discontinued. Levaquin completed on 01/31/15. - Repeat chest x-ray 01/27/15: No acute cardiopulmonary findings. -Blood cultures 2 and urine culture times one: Negative to date -She seems much improved and near her baseline  Acute exacerbation of COPD -Continue Prednisone 40 mg PO q daily - Outpatient follow-up with Dr. Joya Gaskins  Acute diastolic CHF - BNP 093 > 8182, started IV Lasix 7/1. - Continue IV Lasix. - - 6.7 L since admission.  Chest pain/elevated troponins - Early morning of 7/4, patient complained of right-sided chest pain, severe, radiating to right upper extremity, associated with headache, not relieved with sublingual NTG 2 but then relieved after breathing  treatment. - Resolved - Initially had presented with elevated troponins which in the absence of chest pain were felt to be from demand ischemia. -  2-D echo: LVEF 50-55 percent and no wall motion abnormalities.  - Cardiology consulted and are concerned regarding CAD.  - Troponin has trended down to 0.22. - Patient undergoing cardiac catheterization on 02/01/2015 that revealed 70% stenosis involving second marginal, cardiology recommending medical management.   ? Community-acquired pneumonia - Improved. Elevated pro-calcitonin-decreasing. Chest x-ray repeated and negative. - Antibiotic spectrum narrowed to oral levofloxacin-complete total 7 days treatment  Anemia - Stable.  Thrombocytopenia  - Resolved   Hypertension - Mildly uncontrolled. Continue ARB (changed from Cozaar to Avapro).   HLD - Statins  Hard of hearing  PAD status post right lower extremity stents  Confusion/? Delirium - Likely secondary to acute illness. No focal deficits. Monitor - Resolved.  Paroxysmal multifocal atrial tachycardia - Likely precipitated by acute lung issues. Reviewed telemetry with the cardiologist on 7/3. As per recommendations, started bisoprolol low-dose at 2.5 MG daily. - Improved.  Hypokalemia - Secondary to diuresis. Improved.  Hyperglycemia - Check A1c: 6.4. Placed on SSI.   DVT prophylaxis: Changed to IV heparin Code Status: Full  Family Communication: None at bedside today. Disposition Plan: Pending medical stability and PT eval.? Home versus SNF.  Consultants:  CCM -signed off  Cardiology  Procedures:  2 D Echo 01/27/15: Study Conclusions  - Left ventricle: The cavity size was normal. Wall thickness was increased in a pattern of mild LVH. Systolic function was low normal to mildly reduced. The estimated ejection fraction was in the range of 50% to 55%. Wall motion was normal; there were no regional wall motion abnormalities. Doppler parameters  are consistent with abnormal left ventricular relaxation (grade 1 diastolic dysfunction). - Aortic valve: There  was no stenosis. There was mild regurgitation. - Mitral valve: Mildly calcified annulus. Mildly calcified leaflets . There was mild to moderate regurgitation. - Right ventricle: Poorly visualized. The cavity size was normal. Systolic function was normal. - Tricuspid valve: Peak RV-RA gradient (S): 30 mm Hg. - Pulmonary arteries: PA peak pressure: 45 mm Hg (S). - Systemic veins: IVC measured 2.3 cm with < 50% respirophasic variation, suggesting RA pressure 15 mmHg.  Impressions:  - Normal LV size with mild LV hypertrophy. EF 50-55%, low normal to mildly reduced systolic function. Normal RV size and systolic function. Mild to moderate MR. Mild pulmonary hypertension. Dilated IVC.  PICC line RUE 01/27/15  Foley catheter: DC'd 7/3  Antibiotics:  IV vancomycin 6/30 > DC'd  IV Aztreonam 6/30 > DC'd  Oral levofloxacin > 01/31/15  Subjective: Patient reports feeling better, denies worsening CP or shortness of breath  Objective: Filed Vitals:   02/01/15 1441 02/01/15 1446 02/01/15 1451 02/01/15 1519  BP: 129/68 123/63 129/65 128/58  Pulse: 67 64 66 68  Temp:      TempSrc:      Resp: 6 35 4   Height:      Weight:      SpO2: 97% 97% 100%     Intake/Output Summary (Last 24 hours) at 02/01/15 1603 Last data filed at 02/01/15 0600  Gross per 24 hour  Intake    340 ml  Output    700 ml  Net   -360 ml   Filed Weights   01/31/15 0514 01/31/15 1541 02/01/15 0647  Weight: 57.879 kg (127 lb 9.6 oz) 57.879 kg (127 lb 9.6 oz) 60.691 kg (133 lb 12.8 oz)     Exam:  General exam: Pleasant elderly female seen ambulating back from the bathroom with assistance and rolling walker. Respiratory system: clear to auscultation. No increased work of breathing. Cardiovascular system: S1 & S2 heard, RRR. No JVD, murmurs, gallops, clicks. Sinus rhythm. Trace leg  edema right >left. Right leg apparently chronically larger than left since stenting procedure. Gastrointestinal system: Abdomen is nondistended, soft and nontender. Normal bowel sounds heard. Central nervous system: Alert and oriented. No focal neurological deficits. Extremities: Symmetric 5 x 5 power.Easy bruising of skin.   Data Reviewed: Basic Metabolic Panel:  Recent Labs Lab 01/28/15 0500 01/29/15 0625 01/30/15 0432 01/31/15 0555 02/01/15 0440  NA 142 144 140 139 141  K 3.7 3.0* 3.5 4.1 4.1  CL 100* 93* 93* 94* 97*  CO2 32 42* 39* 39* 39*  GLUCOSE 199* 191* 154* 148* 162*  BUN 21* 23* 32* 30* 23*  CREATININE 0.71 0.72 0.59 0.67 0.72  CALCIUM 9.1 8.9 8.7* 8.5* 8.7*   Liver Function Tests:  Recent Labs Lab 01/26/15 2020  AST 21  ALT 33  ALKPHOS 87  BILITOT 0.5  PROT 5.3*  ALBUMIN 2.7*   No results for input(s): LIPASE, AMYLASE in the last 168 hours. No results for input(s): AMMONIA in the last 168 hours. CBC:  Recent Labs Lab 01/26/15 2020 01/27/15 0400 01/28/15 0500 01/31/15 0555 02/01/15 0440  WBC 11.1* 8.4 8.9 9.9 8.3  NEUTROABS 10.5*  --   --   --   --   HGB 11.0* 10.4* 11.2* 11.4* 10.8*  HCT 34.3* 31.7* 34.6* 34.8* 33.9*  MCV 102.1* 101.3* 100.0 102.4* 103.4*  PLT 152 143* 177 149* 151   Cardiac Enzymes:  Recent Labs Lab 01/26/15 2250 01/27/15 0400 01/27/15 1148 01/30/15 0730  TROPONINI 0.41* 0.40* 0.31* 0.22*   BNP (last  3 results) No results for input(s): PROBNP in the last 8760 hours. CBG:  Recent Labs Lab 01/31/15 0610 01/31/15 1053 01/31/15 1636 01/31/15 2213 02/01/15 0604  GLUCAP 118* 114* 239* 191* 122*    Recent Results (from the past 240 hour(s))  MRSA PCR Screening     Status: Abnormal   Collection Time: 01/26/15  5:03 PM  Result Value Ref Range Status   MRSA by PCR POSITIVE (A) NEGATIVE Final    Comment:        The GeneXpert MRSA Assay (FDA approved for NASAL specimens only), is one component of a comprehensive  MRSA colonization surveillance program. It is not intended to diagnose MRSA infection nor to guide or monitor treatment for MRSA infections. RESULT CALLED TO, READ BACK BY AND VERIFIED WITH: Tyson Babinski RN [id number] 01/26/15 A BROWNING   Urine culture     Status: None   Collection Time: 01/26/15  6:21 PM  Result Value Ref Range Status   Specimen Description URINE, CATHETERIZED  Final   Special Requests NONE  Final   Culture NO GROWTH 2 DAYS  Final   Report Status 01/28/2015 FINAL  Final  Culture, blood (routine x 2)     Status: None   Collection Time: 01/26/15  8:15 PM  Result Value Ref Range Status   Specimen Description BLOOD LEFT ARM  Final   Special Requests IN PEDIATRIC BOTTLE 3CC  Final   Culture NO GROWTH 5 DAYS  Final   Report Status 01/31/2015 FINAL  Final  Culture, blood (routine x 2)     Status: None   Collection Time: 01/26/15  8:20 PM  Result Value Ref Range Status   Specimen Description BLOOD LEFT ARM  Final   Special Requests BOTTLES DRAWN AEROBIC ONLY 5CC  Final   Culture NO GROWTH 5 DAYS  Final   Report Status 01/31/2015 FINAL  Final  Culture, sputum-assessment     Status: None   Collection Time: 01/29/15  5:28 AM  Result Value Ref Range Status   Specimen Description SPUTUM  Final   Special Requests NONE  Final   Sputum evaluation   Final    THIS SPECIMEN IS ACCEPTABLE. RESPIRATORY CULTURE REPORT TO FOLLOW.   Report Status 01/29/2015 FINAL  Final  Culture, respiratory (NON-Expectorated)     Status: None   Collection Time: 01/29/15  5:28 AM  Result Value Ref Range Status   Specimen Description SPUTUM  Final   Special Requests NONE  Final   Gram Stain   Final    RARE WBC PRESENT,BOTH PMN AND MONONUCLEAR FEW SQUAMOUS EPITHELIAL CELLS PRESENT FEW GRAM POSITIVE COCCI IN PAIRS Performed at [facility]    Culture   Final    NORMAL OROPHARYNGEAL FLORA Performed at [facility]    Report Status 01/31/2015 FINAL  Final      Studies: No results  found.      Scheduled Meds: . antiseptic oral rinse  7 mL Mouth Rinse BID  . arformoterol  15 mcg Nebulization Q12H  . [START ON 02/02/2015] aspirin  81 mg Oral Daily  . atorvastatin  80 mg Oral QHS  . baclofen  10 mg Oral TID  . bisoprolol  2.5 mg Oral Daily  . budesonide (PULMICORT) nebulizer solution  0.25 mg Nebulization Daily  . cycloSPORINE  2 drop Both Eyes BID  . ferrous sulfate  325 mg Oral TID WC  . fluticasone  1 spray Each Nare Daily  . guaiFENesin  1,200 mg Oral  BID  . [START ON 02/02/2015] heparin  5,000 Units Subcutaneous 3 times per day  . insulin aspart  0-9 Units Subcutaneous TID WC  . irbesartan  150 mg Oral Daily  . levofloxacin  750 mg Oral Q48H  . pantoprazole  40 mg Oral BID AC  . polyethylene glycol  17 g Oral Daily  . predniSONE  40 mg Oral Q breakfast  . rOPINIRole  1 mg Oral TID  . sodium chloride  10-40 mL Intracatheter Q12H  . sodium chloride  3 mL Intravenous Q12H  . ticagrelor  90 mg Oral BID  . tiotropium  18 mcg Inhalation Daily   Continuous Infusions: . sodium chloride      Principal Problem:   Acute respiratory failure Active Problems:   Essential hypertension   COPD with acute bronchitis   HCAP (healthcare-associated pneumonia)   COPD exacerbation   Acute on chronic respiratory failure with hypoxia   Atrial tachycardia   Hypokalemia   Chest pain   Elevated troponin    Time spent: 20 minutes.    Kelvin Cellar, MD [facility] Pager [phone]  If 7PM-7AM, please contact night-coverage [email or web address] Password [email or web address] 02/01/2015, 4:03 PM    LOS: 6 days

## 2015-02-01 NOTE — Progress Notes (Signed)
ANTICOAGULATION CONSULT NOTE - Follow Up Consult  Pharmacy Consult for heparin Indication: chest pain/ACS   Labs:  Recent Labs  01/29/15 0625 01/30/15 0432 01/30/15 0730 01/30/15 2105 01/31/15 0555 01/31/15 1145 02/01/15 0018  HGB  --   --   --   --  11.4*  --   --   HCT  --   --   --   --  34.8*  --   --   PLT  --   --   --   --  149*  --   --   HEPARINUNFRC  --   --   --  1.82*  --  1.08* 0.48  CREATININE 0.72 0.59  --   --  0.67  --   --   TROPONINI  --   --  0.22*  --   --   --   --      Assessment/Plan:  81yo female therapeutic on heparin after rate adjustments. Will continue gtt at current rate and confirm stable with am labs.   Wynona Neat, PharmD, BCPS  02/01/2015,12:58 AM

## 2015-02-01 NOTE — Progress Notes (Signed)
ANTICOAGULATION CONSULT NOTE - Follow-up Consult  Pharmacy Consult for Heparin Indication: chest pain/ACS  Allergies  Allergen Reactions  . Doxycycline     REACTION: insomnia  . Fluoxetine   . Horizant [Gabapentin]   . Other     Bee stings - internal swelling per pt Bee stings - internal swelling per pt  . Penicillins     REACTION: shock  . Plavix [Clopidogrel]     Arm turned purple, rash  . Prozac [Fluoxetine Hcl]   . Sulfa Antibiotics   . Codeine Rash    Patient Measurements: Height: 5\' 1"  (154.9 cm) Weight: 133 lb 12.8 oz (60.691 kg) IBW/kg (Calculated) : 47.8  Heparin dosing wt: 58 kg  Vital Signs: Temp: 98 F (36.7 C) (07/06 0647) Temp Source: Oral (07/06 0647) BP: 137/44 mmHg (07/06 0647) Pulse Rate: 74 (07/06 0647)  Labs:  Recent Labs  01/30/15 0432 01/30/15 0730  01/31/15 0555 01/31/15 1145 02/01/15 0018 02/01/15 0331 02/01/15 0440  HGB  --   --   --  11.4*  --   --   --  10.8*  HCT  --   --   --  34.8*  --   --   --  33.9*  PLT  --   --   --  149*  --   --   --  151  LABPROT  --   --   --   --   --   --  14.3  --   INR  --   --   --   --   --   --  1.09  --   HEPARINUNFRC  --   --   < >  --  1.08* 0.48  --  0.38  CREATININE 0.59  --   --  0.67  --   --   --  0.72  TROPONINI  --  0.22*  --   --   --   --   --   --   < > = values in this interval not displayed.  Estimated Creatinine Clearance: 47.7 mL/min (by C-G formula based on Cr of 0.72).  Assessment: 81 year old female with a complex medical history known to pharmacy from previous antibiotic dosing now to begin heparin for ACS in preparation for cath.   Heparin was decreased to 500 units/hr and the heparin level returned therapeutic at 0.48 earlier this AM.  Confirmation heparin level returned this AM remaining therapeutic at 0.38.  Hgb dropped from 11.4 to 10.8 but platelets have improved to 151.  No reports of bleeding at this time.    Goal of Therapy:  Heparin level 0.3-0.7  units/ml Monitor platelets by anticoagulation protocol: Yes   Plan:  Continue heparin at 500 units/hr Daily HL and CBC Monitor for signs and symptoms of bleeding Patient on schedule for cath today, to follow up anticoagulation plans post cath.    Hassie Bruce, Pharm. D. Clinical Pharmacy Resident Pager: [phone] Ph: [phone] 02/01/2015 10:09 AM

## 2015-02-02 ENCOUNTER — Encounter (HOSPITAL_COMMUNITY): Payer: Self-pay | Admitting: Cardiology

## 2015-02-02 DIAGNOSIS — J432 Centrilobular emphysema: Secondary | ICD-10-CM

## 2015-02-02 DIAGNOSIS — J9601 Acute respiratory failure with hypoxia: Secondary | ICD-10-CM

## 2015-02-02 DIAGNOSIS — J441 Chronic obstructive pulmonary disease with (acute) exacerbation: Secondary | ICD-10-CM | POA: Insufficient documentation

## 2015-02-02 DIAGNOSIS — J449 Chronic obstructive pulmonary disease, unspecified: Secondary | ICD-10-CM | POA: Insufficient documentation

## 2015-02-02 LAB — BASIC METABOLIC PANEL
Anion gap: 7 (ref 5–15)
BUN: 17 mg/dL (ref 6–20)
CALCIUM: 8.7 mg/dL — AB (ref 8.9–10.3)
CHLORIDE: 100 mmol/L — AB (ref 101–111)
CO2: 35 mmol/L — ABNORMAL HIGH (ref 22–32)
CREATININE: 0.74 mg/dL (ref 0.44–1.00)
GFR calc non Af Amer: >60 mL/min (ref >60)
Glucose, Bld: 182 mg/dL — ABNORMAL HIGH (ref 65–99)
Potassium: 4.3 mmol/L (ref 3.5–5.1)
SODIUM: 142 mmol/L (ref 135–145)

## 2015-02-02 LAB — GLUCOSE, CAPILLARY
GLUCOSE-CAPILLARY: 139 mg/dL — AB (ref 65–99)
Glucose-Capillary: 153 mg/dL — ABNORMAL HIGH (ref 65–99)

## 2015-02-02 MED ORDER — FUROSEMIDE 20 MG PO TABS: 20.0000 mg | ORAL_TABLET | Freq: Every day | ORAL | Status: AC

## 2015-02-02 MED ORDER — FUROSEMIDE 20 MG PO TABS
20.0000 mg | ORAL_TABLET | Freq: Every day | ORAL | Status: DC
  Administered 2015-02-02: 20 mg via ORAL
  Filled 2015-02-02: qty 1

## 2015-02-02 MED ORDER — FUROSEMIDE 40 MG PO TABS
40.0000 mg | ORAL_TABLET | Freq: Every day | ORAL | Status: DC
  Filled 2015-02-02: qty 1

## 2015-02-02 MED ORDER — BISOPROLOL FUMARATE 5 MG PO TABS: 2.5000 mg | ORAL_TABLET | Freq: Every day | ORAL | Status: AC

## 2015-02-02 MED ORDER — ASPIRIN 81 MG PO CHEW: 81.0000 mg | CHEWABLE_TABLET | Freq: Every day | ORAL | Status: AC

## 2015-02-02 MED ORDER — PREDNISONE 10 MG (21) PO TBPK: ORAL_TABLET | ORAL | Status: AC

## 2015-02-02 NOTE — Progress Notes (Signed)
Occupational Therapy Treatment Patient Details Name: Tina [name] MRN: [id number] DOB: 01/22/1934 Today's Date: 02/02/2015    History of present illness 81 year old female with COPD (PW patient) was admitted in [facility] for AECOPD and CAP on 6/27. 6/30 her WBC and PNA were noted to be worsening. She requested transfer to [facility] Scheduled for cardiac cath 7/5?   OT comments  Pt making good progress with functional goals and should continue with acute OT services to increase level of function and safety  Follow Up Recommendations  Home health OT;Supervision/Assistance - 24 hour    Equipment Recommendations  None recommended by OT    Recommendations for Other Services      Precautions / Restrictions Precautions Precautions: Fall Precaution Comments: watch HR Restrictions Weight Bearing Restrictions: No       Mobility Bed Mobility Overal bed mobility: Needs Assistance       Supine to sit: Supervision Sit to supine: Supervision      Transfers Overall transfer level: Needs assistance Equipment used: Rolling walker (2 wheeled) Transfers: Sit to/from Stand Sit to Stand: Min guard              Balance   Sitting-balance support: No upper extremity supported;Feet supported Sitting balance-Leahy Scale: Good     Standing balance support: During functional activity Standing balance-Leahy Scale: Fair                     ADL       Grooming: Oral care;Wash/dry face;Wash/dry hands;Set up;Supervision/safety;Standing (applied lipstick)       Lower Body Bathing: Min guard;Sit to/from stand       Lower Body Dressing: Sit to/from stand;Min guard   Toilet Transfer: Min guard;Ambulation;RW;Comfort height toilet;Grab bars;BSC   Toileting- Clothing Manipulation and Hygiene: Supervision/safety;Sit to/from stand       Functional mobility during ADLs: Min guard        Vision  no change from baseline                   Perception  Perception Perception Tested?: No   Praxis Praxis Praxis tested?: Not tested    Cognition   Behavior During Therapy: WFL for tasks assessed/performed Overall Cognitive Status: Within Functional Limits for tasks assessed                       Extremity/Trunk Assessment   generalized weakness                        General Comments  pt very pleasant and cooperative, humorous    Pertinent Vitals/ Pain       Pain Assessment: No/denies pain  Home Living  at home with husband                                        Prior Functioning/Environment  Mod I           Frequency Min 2X/week     Progress Toward Goals  OT Goals(current goals can now be found in the care plan section)  Progress towards OT goals: Progressing toward goals     Plan Discharge plan remains appropriate                     End of Session Equipment Utilized During Treatment: Rolling walker;Oxygen;Other (comment) (BSC)  Activity Tolerance Patient tolerated treatment well   Patient Left in bed;with call bell/phone within reach;with family/visitor present             Time: 1007-1036 OT Time Calculation (min): 29 min  Charges: OT General Charges $OT Visit: 1 Procedure OT Treatments $Self Care/Home Management : 8-22 mins $Therapeutic Activity: 8-22 mins  Britt Bottom 02/02/2015, 1:00 PM

## 2015-02-02 NOTE — Progress Notes (Signed)
Patient Name: Tina [name] Date of Encounter: 02/02/2015  Primary Cardiologist:  Dr Elonda Husky at [facility] in [place]  No CP, has some SOB last night before realizing she was off her O2 for several hours  CURRENT MEDS . antiseptic oral rinse  7 mL Mouth Rinse BID  . arformoterol  15 mcg Nebulization Q12H  . aspirin  81 mg Oral Daily  . atorvastatin  80 mg Oral QHS  . baclofen  10 mg Oral TID  . bisoprolol  2.5 mg Oral Daily  . budesonide (PULMICORT) nebulizer solution  0.25 mg Nebulization Daily  . cycloSPORINE  2 drop Both Eyes BID  . ferrous sulfate  325 mg Oral TID WC  . fluticasone  1 spray Each Nare Daily  . guaiFENesin  1,200 mg Oral BID  . heparin  5,000 Units Subcutaneous 3 times per day  . insulin aspart  0-9 Units Subcutaneous TID WC  . irbesartan  150 mg Oral Daily  . levofloxacin  750 mg Oral Q48H  . pantoprazole  40 mg Oral BID AC  . polyethylene glycol  17 g Oral Daily  . predniSONE  40 mg Oral Q breakfast  . rOPINIRole  1 mg Oral TID  . sodium chloride  10-40 mL Intracatheter Q12H  . sodium chloride  3 mL Intravenous Q12H  . ticagrelor  90 mg Oral BID  . tiotropium  18 mcg Inhalation Daily    OBJECTIVE  Filed Vitals:   02/01/15 1715 02/01/15 2029 02/01/15 2048 02/02/15 0545  BP: 116/47 109/38  129/37  Pulse: 72 77 75 73  Temp:  98.4 F (36.9 C)  97.7 F (36.5 C)  TempSrc:  Oral  Oral  Resp:  18 18 18   Height:      Weight:    131 lb 6.3 oz (59.6 kg)  SpO2:  100% 100% 100%    Intake/Output Summary (Last 24 hours) at 02/02/15 0912 Last data filed at 02/02/15 7893  Gross per 24 hour  Intake    840 ml  Output   1075 ml  Net   -235 ml   Filed Weights   01/31/15 1541 02/01/15 0647 02/02/15 0545  Weight: 127 lb 9.6 oz (57.879 kg) 133 lb 12.8 oz (60.691 kg) 131 lb 6.3 oz (59.6 kg)    PHYSICAL EXAM  General: Pleasant, frail, NAD. Neuro: Alert and oriented X 3. Moves all extremities spontaneously. HEENT:  Normal  Neck:  Supple Lungs:  Diminished BS throughout Heart: RRR Abdomen: Soft, non-tender, non-distended, no masses Extremities: diffuse ecchymoses; 1 + edema RLE (chronic)  Accessory Clinical Findings  CBC  Recent Labs  01/31/15 0555 02/01/15 0440  WBC 9.9 8.3  HGB 11.4* 10.8*  HCT 34.8* 33.9*  MCV 102.4* 103.4*  PLT 149* 810   Basic Metabolic Panel  Recent Labs  02/01/15 0440 02/02/15 0433  NA 141 142  K 4.1 4.3  CL 97* 100*  CO2 39* 35*  GLUCOSE 162* 182*  BUN 23* 17  CREATININE 0.72 0.74  CALCIUM 8.7* 8.7*    Echocardiogram 01/27/2015  LV EF: 50% -  55%  ------------------------------------------------------------------- Indications:   COPD 496.  ------------------------------------------------------------------- History:  PMH: Elevated Troponin. Chronic obstructive pulmonary disease. PMH: Melanoma. Risk factors: Hypertension.  ------------------------------------------------------------------- Study Conclusions  - Left ventricle: The cavity size was normal. Wall thickness was increased in a pattern of mild LVH. Systolic function was low normal to mildly reduced. The estimated ejection fraction was in the range of  50% to 55%. Wall motion was normal; there were no regional wall motion abnormalities. Doppler parameters are consistent with abnormal left ventricular relaxation (grade 1 diastolic dysfunction). - Aortic valve: There was no stenosis. There was mild regurgitation. - Mitral valve: Mildly calcified annulus. Mildly calcified leaflets . There was mild to moderate regurgitation. - Right ventricle: Poorly visualized. The cavity size was normal. Systolic function was normal. - Tricuspid valve: Peak RV-RA gradient (S): 30 mm Hg. - Pulmonary arteries: PA peak pressure: 45 mm Hg (S). - Systemic veins: IVC measured 2.3 cm with < 50% respirophasic variation, suggesting RA pressure 15 mmHg.  Impressions:  - Normal LV size with mild  LV hypertrophy. EF 50-55%, low normal to mildly reduced systolic function. Normal RV size and systolic function. Mild to moderate MR. Mild pulmonary hypertension. Dilated IVC.    Radiology/Studies  Dg Chest 2 View  01/26/2015   CLINICAL DATA:  81 year old female with COPD and wheezing.  EXAM: CHEST  2 VIEW  COMPARISON:  Chest radiograph dated 12/18/2014  FINDINGS: Two views of the chest demonstrate emphysematous changes with mild interstitial prominence similar to prior study. No focal consolidation. No pleural effusion or pneumothorax. The osseous structures are grossly unremarkable. Upper lumbar posterior fixation screws noted.  IMPRESSION: Emphysema.  No consolidation or pneumothorax.   Electronically Signed   By: Anner Crete M.D.   On: 01/26/2015 17:30   Dg Chest Port 1 View  01/27/2015   CLINICAL DATA:  Tachycardia  EXAM: PORTABLE CHEST - 1 VIEW  COMPARISON:  01/26/2015  FINDINGS: There is mild chronic appearing interstitial coarsening. There is no airspace consolidation. There is no large effusion. There is no pneumothorax. Heart size is normal unchanged.  IMPRESSION: No acute cardiopulmonary findings.   Electronically Signed   By: Andreas Newport M.D.   On: 01/27/2015 04:07    ASSESSMENT AND PLAN  Ms. Tortorella is a 81 y/o F with history of COPD, chronic respiratory failure on O2, HTN, HLD, PAD s/p prior R SFA stenting (followed by [facility] - chronic RLE since that time) who was transferred to [facility] on 01/26/15 due to worsening respiratory status.  1. Acute on chronic respiratory failure in the setting of AECOPD and tracheobronchitis with baseline severe COPD   - continue steroids, bronchodilators and antibiotics; management per primary care.  2. Chest pain with previous mildly elevated troponin this admission  - Chest pain was felt to be atypical however, pt had anterior ST/T changes concerning for underlying LAD dx and abnormal troponin; echo shows low normal LV function.  -  cath 02/01/2015 EF 55-65%, 40% prox LAD, 50% mid LAD, 70% OM2, 30% prox RCA, medical management. She did have mildly elevated trop of 0.4, however brilinta may exacerbated bruising. Will discuss with MD regarding duration of brilinta. Continue 81mg  ASA (she was on 325mg  ASA at home which has been transitioned to 81mg )  - likely can be discharged today to followup with her cardiologist at [facility].  3. HTN  - Controlled; continue present medications.  4. Multifocal atrial tachycardia, likely due to #1  5. PAD s/p LE stenting last year, not on Plavix due to skin reaction, soft right carotid bruit, can be followed as outpatient  6. Acute diastolic CHF, continue low dose lasix after discharge, discussed with patient regarding Na and fluid restriction, advised her to weigh herself daily and contact cardiology if her weight increase by more than 2-3 lbs overnight.   - need BMET in 7 days after discharge to check renal  function and K  7. Chronic RLE swelling (chronic; h/o R SFA on that side, denies any prior h/o DVT)  Signed, Almyra Deforest PA Pager: [phone] As above, patient seen and examined; cath results noted; plan medical therapy. Continue ASA, low dose beta blocker and statin. DC brilinta; patient can be DCed from a cardiac standpoint and fu with her cardiologist in [facility]. Please call with questions. Kirk Ruths

## 2015-02-02 NOTE — Care Management Note (Signed)
Case Management Note  Patient Details  Name: Tina [name] MRN: [id number] Date of Birth: [birth year]  Subjective/Objective:    Admitted with Acute Resp Failure                Action/Plan: Patient lives at home with spouse; Has home 02 with [facility]; [place] choices offered, patient chose [place] for [facility]: [facility] services; Butch Penny with [facility] called for arrangements; No DME needed at this time;  Expected Discharge Date:    02/02/2015              Expected Discharge Plan:  [place]  In-House Referral:  Clinical Social Work  Discharge planning Services  CM Consult   Choice offered to:  Patient     DME Agency:  NA  HH Arranged:  RN, PT, OT, Nurse's Aide [place] Agency:  [place]  Status of Service:  In process, will continue to follow  Medicare Important Message Given:  [facility] notification given  Sherrilyn Rist [phone] 02/02/2015, 11:17 AM

## 2015-02-02 NOTE — Progress Notes (Signed)
PCCM Interval Progress Note  SUBJECTIVE:  No complaints.  Ambulating in hallway, ready for discharge from cardiology and primary team perspective - pts family asking for pulmonary re-assurance.  Had cardiac cath 7/6 that revealed 70% stenosis involving second marginal, cardiology recommending medical management only.    Filed Vitals:   02/01/15 2029 02/01/15 2048 02/02/15 0545 02/02/15 1001  BP: 109/38  129/37   Pulse: 77 75 73   Temp: 98.4 F (36.9 C)  97.7 F (36.5 C)   TempSrc: Oral  Oral   Resp: 18 18 18    Height:      Weight:   59.6 kg (131 lb 6.3 oz)   SpO2: 100% 100% 100% 100%   Chronically ill appearing elderly female, in NAD. [place]/AT, 2L O2 via . No JVD Diminished BS with prolonged exp phase, no wheeze. Reg, no M/R/G/g. BS x 4, abd soft, NT/ND. No edema.   Intake/Output Summary (Last 24 hours) at 02/02/15 1118 Last data filed at 02/02/15 0812  Gross per 24 hour  Intake    840 ml  Output   1075 ml  Net   -235 ml     BMET    Component Value Date/Time   NA 142 02/02/2015 0433   K 4.3 02/02/2015 0433   CL 100* 02/02/2015 0433   CO2 35* 02/02/2015 0433   GLUCOSE 182* 02/02/2015 0433   BUN 17 02/02/2015 0433   CREATININE 0.74 02/02/2015 0433   CALCIUM 8.7* 02/02/2015 0433   GFRNONAA >60 02/02/2015 0433   GFRAA >60 02/02/2015 0433    CBC    Component Value Date/Time   WBC 8.3 02/01/2015 0440   RBC 3.28* 02/01/2015 0440   HGB 10.8* 02/01/2015 0440   HCT 33.9* 02/01/2015 0440   PLT 151 02/01/2015 0440   MCV 103.4* 02/01/2015 0440   MCH 32.9 02/01/2015 0440   MCHC 31.9 02/01/2015 0440   RDW 14.7 02/01/2015 0440   LYMPHSABS 0.3* 01/26/2015 2020   MONOABS 0.3 01/26/2015 2020   EOSABS 0.0 01/26/2015 2020   BASOSABS 0.0 01/26/2015 2020    CXR 01/27/15 >>> chronic interstitial coarsening.  No acute process.   IMPRESSION / PLAN:   AECOPD -  COPD  GOLD II by last spirometry 05/10/10 with FEV1 1.04 (69%) ratio 49.  On chronic steroid rx, chronic O2 >  baseline walks at [employer] pushing cart which carries her 02 so this is not endstage dz. Acute on chronic hypercarbic and hypoxemic resp failure. Former smoker. Continue supplemental O2 as needed to maintain SpO2 > 88%. Cont nebulized steroids/LABA, tiotropium (LAMA) > has at home already. Discharge on prednisone taper back to baseline of pred 5 mg daily. Continue PPI, flutter valve. Ideally should not be on dpi with pseudowheeze but respimat not on formulary so will do this in office setting. Follow up with [facility] as needed (pt would like to follow up with provider she has seen in [facility] before, if unable to get appointment in next week, she will call [place]).   Stable for discharge home from a pulmonary standpoint.  She will follow up with a provider in [facility] (she requested this instead of me scheduling her for pulmonary follow up now).  If she is unable to get appointment in next week, she has been instructed to call [place] [facility] and schedule with Tammy or Dr. Melvyn Novas.   Montey Hora, [facility] Pager: [phone]  or [phone]  02/02/2015, 12:06 PM

## 2015-02-02 NOTE — Discharge Summary (Signed)
Physician Discharge Summary  Tina [name] OEU:[id number] DOB: 19-Sep-1933 DOA: 01/26/2015  PCP: Lolita Patella, MD  Admit date: 01/26/2015 Discharge date: 02/02/2015  Time spent: 35 minutes  Recommendations for Outpatient Follow-up:  1. Please follow up on respiratory status, she was treated for COPD exacerbation 2. Prior to discharge she was set up with [facility] for PT, OT, RN  Discharge Diagnoses:  Principal Problem:   Acute respiratory failure Active Problems:   Essential hypertension   COPD with acute bronchitis   HCAP (healthcare-associated pneumonia)   COPD exacerbation   Acute on chronic respiratory failure with hypoxia   Atrial tachycardia   Hypokalemia   Chest pain   Elevated troponin   Chronic obstructive pulmonary disease with acute exacerbation   Discharge Condition: Stable  Diet recommendation: Heart Healthy  Filed Weights   01/31/15 1541 02/01/15 0647 02/02/15 0545  Weight: 57.879 kg (127 lb 9.6 oz) 60.691 kg (133 lb 12.8 oz) 59.6 kg (131 lb 6.3 oz)    History of present illness:  Patient is a 81 year old female with COPD, chronic respiratory failure on O2, hypertension, hyperlipidemia apparently was placed on Z-Pak due to COPD exacerbation. Patient was admitted on 6/27 to [facility] with right lower lung pneumonia. Patient was placed on IV vancomycin and I stress on him and Levaquin. She received scheduled broncho-dilators and prednisone. On 6/30, patient was noted to have worsening white count of 16.3 and worsening pneumonia. Patient with a tachycardia and acute respiratory failure. Patient requested to be transferred to [facility], she follows Dr. Joya Gaskins as her pulmonologist.   Hospital Course:  81 year old female patient with history of COPD, not on home oxygen until 2 weeks ago and since then has been on 2 L/m intermittently, former smoker-quit 20+ years ago, HTN, HLD, PAD status post right stents 2, chronic leg edema right  >left, hard of hearing, was admitted on 6/27 to [facility] with RLL pneumonia and treated with IV vancomycin and levofloxacin. On 6/30, her condition worsened including tachycardia, acute respiratory failure, leukocytosis and worsening pneumonia. Family/patient requested transfer to [facility] so that her primary pulmonologist could evaluate her. Respiratory issues have improved and stabilized. Developed chest pain 7/4. Cardiology consulted for elevated troponins. She underwent cardiac catheterization on 02/01/2015, having 40% prox LAD, 50% mid LAD, 70% OM2, 30% prox RCA, with Cards recommending medical management. Cardiology also recommended starting low dose beta blocker with bisoprolol, decreasing ASA to 81 mg and stopping Brilinta. Patient was also evaluated by PCCM during this hospitalization. Recommended titrating prednisone down to 5 mg PO q daily.She was discharged in stable condition on 02/02/2015. She was able to ambulate down the hallway and back on day of discharge.   Procedures:  Cardiac Catheterization Impression:  Prox LAD lesion, 40% stenosed.  Mid LAD lesion, 50% stenosed.  1st Mrg lesion, 30% stenosed.  2nd Mrg lesion, 70% stenosed.  Prox RCA lesion, 30% stenosed.  Mid RCA lesion, 15% stenosed.  The left ventricular systolic function is normal.  Consultations:  Cardiology  Pulmonary Medicine  Discharge Exam: Filed Vitals:   02/02/15 0545  BP: 129/37  Pulse: 73  Temp: 97.7 F (36.5 C)  Resp: 18    General exam: Pleasant elderly female no acute distress, breathing comfortably. Respiratory system: clear to auscultation. No increased work of breathing. Cardiovascular system: S1 & S2 heard, RRR. No JVD, murmurs, gallops, clicks. Sinus rhythm. Trace leg edema right >left. Right leg apparently chronically larger than left since stenting procedure. Gastrointestinal system: Abdomen  is nondistended, soft and nontender. Normal bowel sounds heard. Central nervous system:  Alert and oriented. No focal neurological deficits. Extremities: Symmetric 5 x 5 power.  Discharge Instructions   Discharge Instructions    Call MD for:  difficulty breathing, headache or visual disturbances    Complete by:  As directed      Call MD for:  extreme fatigue    Complete by:  As directed      Call MD for:  hives    Complete by:  As directed      Call MD for:  persistant dizziness or light-headedness    Complete by:  As directed      Call MD for:  persistant nausea and vomiting    Complete by:  As directed      Call MD for:  redness, tenderness, or signs of infection (pain, swelling, redness, odor or green/yellow discharge around incision site)    Complete by:  As directed      Call MD for:  severe uncontrolled pain    Complete by:  As directed      Call MD for:  temperature >100.4    Complete by:  As directed      Call MD for:    Complete by:  As directed      Diet - low sodium heart healthy    Complete by:  As directed      Increase activity slowly    Complete by:  As directed           Current Discharge Medication List    START taking these medications   Details  aspirin 81 MG chewable tablet Chew 1 tablet (81 mg total) by mouth daily. Qty: 30 tablet, Refills: 1    bisoprolol (ZEBETA) 5 MG tablet Take 0.5 tablets (2.5 mg total) by mouth daily. Qty: 60 tablet, Refills: 1    furosemide (LASIX) 20 MG tablet Take 1 tablet (20 mg total) by mouth daily. Qty: 30 tablet, Refills: 1    predniSONE (STERAPRED UNI-PAK 21 TAB) 10 MG (21) TBPK tablet Take 40 mg po q day x1 then 30 mg PO q day x1, then 20 mg PO q day x1 then 10 mg PO q day x 1 then continue at 5 mg PO q daily thereafter. Qty Suff Qty: 30 tablet, Refills: 0      CONTINUE these medications which have NOT CHANGED   Details  atorvastatin (LIPITOR) 10 MG tablet Take 10 mg by mouth at bedtime.    baclofen (LIORESAL) 10 MG tablet Take 1 tablet (10 mg total) by mouth 3 (three) times daily. Qty: 90 each,  Refills: 5    budesonide (PULMICORT) 0.25 MG/2ML nebulizer solution Take 2 mLs (0.25 mg total) by nebulization daily. Dx code J44.1 Qty: 60 mL, Refills: 11    clobetasol cream (TEMOVATE) 6.78 % Apply 1 application topically 2 (two) times daily as needed (finger irritation).     cycloSPORINE (RESTASIS) 0.05 % ophthalmic emulsion Place 2 drops into both eyes 2 (two) times daily.    ferrous sulfate 325 (65 FE) MG tablet Take 325 mg by mouth at bedtime.     formoterol (PERFOROMIST) 20 MCG/2ML nebulizer solution Take 2 mLs (20 mcg total) by nebulization 2 (two) times daily. Dx code J44.1 Qty: 120 mL, Refills: 11    lidocaine (LIDODERM) 5 % Place 1 patch onto the skin daily as needed (hip pain).     losartan (COZAAR) 25 MG tablet Take 75 mg by  mouth daily.    mometasone (NASONEX) 50 MCG/ACT nasal spray Place 2 sprays into the nose daily. Qty: 17 g, Refills: 11    polyethylene glycol powder (GLYCOLAX/MIRALAX) powder Take 17 g by mouth as needed for mild constipation or moderate constipation.     potassium chloride SA (K-DUR,KLOR-CON) 20 MEQ tablet Take 20 mEq by mouth 2 (two) times daily.    rOPINIRole (REQUIP) 1 MG tablet Take 1 mg by mouth 2 (two) times daily.     tiotropium (SPIRIVA) 18 MCG inhalation capsule Place 1 capsule (18 mcg total) into inhaler and inhale daily. Qty: 90 capsule, Refills: 3   Associated Diagnoses: Chronic airway obstruction, not elsewhere classified    esomeprazole (NEXIUM) 40 MG capsule Take 1 capsule (40 mg total) by mouth daily before breakfast. Qty: 90 capsule, Refills: 3    guaiFENesin (MUCINEX) 600 MG 12 hr tablet Take 600 mg by mouth at bedtime.    oxyCODONE-acetaminophen (PERCOCET) 7.5-325 MG per tablet Take 1 tablet by mouth every 8 (eight) hours as needed. Qty: 90 tablet, Refills: 0      STOP taking these medications     aspirin 325 MG tablet      polyethylene glycol (MIRALAX / GLYCOLAX) packet        Allergies  Allergen Reactions  .  Doxycycline     REACTION: insomnia  . Fluoxetine   . Horizant [Gabapentin]   . Other     Bee stings - internal swelling per pt Bee stings - internal swelling per pt  . Penicillins     REACTION: shock  . Plavix [Clopidogrel]     Arm turned purple, rash  . Prozac [Fluoxetine Hcl]   . Sulfa Antibiotics   . Codeine Rash   Follow-up Information    Follow up with Lolita Patella, MD In 1 week.   Specialty:  Family Medicine   Contact information:   [place] [address] [phone]       Follow up with Asencion Noble, MD In 1 week.   Specialty:  Pulmonary Disease   Contact information:   [place] [address] [phone]        The results of significant diagnostics from this hospitalization (including imaging, microbiology, ancillary and laboratory) are listed below for reference.    Significant Diagnostic Studies: Dg Chest 2 View  01/26/2015   CLINICAL DATA:  81 year old female with COPD and wheezing.  EXAM: CHEST  2 VIEW  COMPARISON:  Chest radiograph dated 12/18/2014  FINDINGS: Two views of the chest demonstrate emphysematous changes with mild interstitial prominence similar to prior study. No focal consolidation. No pleural effusion or pneumothorax. The osseous structures are grossly unremarkable. Upper lumbar posterior fixation screws noted.  IMPRESSION: Emphysema.  No consolidation or pneumothorax.   Electronically Signed   By: Anner Crete M.D.   On: 01/26/2015 17:30   Dg Chest Port 1 View  01/27/2015   CLINICAL DATA:  Tachycardia  EXAM: PORTABLE CHEST - 1 VIEW  COMPARISON:  01/26/2015  FINDINGS: There is mild chronic appearing interstitial coarsening. There is no airspace consolidation. There is no large effusion. There is no pneumothorax. Heart size is normal unchanged.  IMPRESSION: No acute cardiopulmonary findings.   Electronically Signed   By: Andreas Newport M.D.   On: 01/27/2015 04:07    Microbiology: Recent Results (from the  past 240 hour(s))  MRSA PCR Screening     Status: Abnormal   Collection Time: 01/26/15  5:03 PM  Result Value Ref Range Status   MRSA by PCR POSITIVE (A) NEGATIVE Final    Comment:        The GeneXpert MRSA Assay (FDA approved for NASAL specimens only), is one component of a comprehensive MRSA colonization surveillance program. It is not intended to diagnose MRSA infection nor to guide or monitor treatment for MRSA infections. RESULT CALLED TO, READ BACK BY AND VERIFIED WITH: Tyson Babinski RN 1935 01/26/15 A BROWNING   Urine culture     Status: None   Collection Time: 01/26/15  6:21 PM  Result Value Ref Range Status   Specimen Description URINE, CATHETERIZED  Final   Special Requests NONE  Final   Culture NO GROWTH 2 DAYS  Final   Report Status 01/28/2015 FINAL  Final  Culture, blood (routine x 2)     Status: None   Collection Time: 01/26/15  8:15 PM  Result Value Ref Range Status   Specimen Description BLOOD LEFT ARM  Final   Special Requests IN PEDIATRIC BOTTLE 3CC  Final   Culture NO GROWTH 5 DAYS  Final   Report Status 01/31/2015 FINAL  Final  Culture, blood (routine x 2)     Status: None   Collection Time: 01/26/15  8:20 PM  Result Value Ref Range Status   Specimen Description BLOOD LEFT ARM  Final   Special Requests BOTTLES DRAWN AEROBIC ONLY 5CC  Final   Culture NO GROWTH 5 DAYS  Final   Report Status 01/31/2015 FINAL  Final  Culture, sputum-assessment     Status: None   Collection Time: 01/29/15  5:28 AM  Result Value Ref Range Status   Specimen Description SPUTUM  Final   Special Requests NONE  Final   Sputum evaluation   Final    THIS SPECIMEN IS ACCEPTABLE. RESPIRATORY CULTURE REPORT TO FOLLOW.   Report Status 01/29/2015 FINAL  Final  Culture, respiratory (NON-Expectorated)     Status: None   Collection Time: 01/29/15  5:28 AM  Result Value Ref Range Status   Specimen Description SPUTUM  Final   Special Requests NONE  Final   Gram Stain   Final    RARE WBC  PRESENT,BOTH PMN AND MONONUCLEAR FEW SQUAMOUS EPITHELIAL CELLS PRESENT FEW GRAM POSITIVE COCCI IN PAIRS Performed at [facility]    Culture   Final    NORMAL OROPHARYNGEAL FLORA Performed at [facility]    Report Status 01/31/2015 FINAL  Final     Labs: Basic Metabolic Panel:  Recent Labs Lab 01/29/15 0625 01/30/15 0432 01/31/15 0555 02/01/15 0440 02/02/15 0433  NA 144 140 139 141 142  K 3.0* 3.5 4.1 4.1 4.3  CL 93* 93* 94* 97* 100*  CO2 42* 39* 39* 39* 35*  GLUCOSE 191* 154* 148* 162* 182*  BUN 23* 32* 30* 23* 17  CREATININE 0.72 0.59 0.67 0.72 0.74  CALCIUM 8.9 8.7* 8.5* 8.7* 8.7*   Liver Function Tests:  Recent Labs Lab 01/26/15 2020  AST 21  ALT 33  ALKPHOS 87  BILITOT 0.5  PROT 5.3*  ALBUMIN 2.7*   No results for input(s): LIPASE, AMYLASE in the last 168 hours. No results for input(s): AMMONIA in the last 168 hours. CBC:  Recent Labs Lab 01/26/15 2020 01/27/15 0400 01/28/15 0500 01/31/15 0555 02/01/15 0440  WBC 11.1* 8.4 8.9 9.9 8.3  NEUTROABS 10.5*  --   --   --   --   HGB 11.0* 10.4* 11.2* 11.4* 10.8*  HCT 34.3* 31.7* 34.6*  34.8* 33.9*  MCV 102.1* 101.3* 100.0 102.4* 103.4*  PLT 152 143* 177 149* 151   Cardiac Enzymes:  Recent Labs Lab 01/26/15 2250 01/27/15 0400 01/27/15 1148 01/30/15 0730  TROPONINI 0.41* 0.40* 0.31* 0.22*   BNP: BNP (last 3 results)  Recent Labs  01/26/15 2250 01/28/15 0500  BNP 849.5* 1310.4*    ProBNP (last 3 results) No results for input(s): PROBNP in the last 8760 hours.  CBG:  Recent Labs Lab 01/31/15 2213 02/01/15 0604 02/01/15 1709 02/01/15 2118 02/02/15 0558  GLUCAP 191* 122* 197* 143* 139*       Signed:  Nekeisha Aure  [facility] 02/02/2015, 11:17 AM

## 2015-02-02 NOTE — Progress Notes (Signed)
Pt a/o, no c/o pain, pt being d'c to home with home health, pt stable, leaving with family

## 2015-02-03 ENCOUNTER — Ambulatory Visit: Payer: Medicare Other | Admitting: Neurology

## 2015-02-03 NOTE — Care Management (Signed)
Important Message  Patient Details  Name: Tina [name] MRN: [id number] Date of Birth: 19-Dec-1933   Medicare Important Message Given:  Yes-third notification given    Nathen May 02/03/2015, 10:18 AM

## 2015-02-08 ENCOUNTER — Ambulatory Visit: Payer: Medicare Other | Admitting: Critical Care Medicine

## 2015-02-10 NOTE — Progress Notes (Addendum)
Returned a f/u request call to [facility] [phone]) and spoke with Manuela Schwartz, Pharmacist to answer clarification question for preseciption of aspirin strength.  Manuela Schwartz stated Aspirin 325mg  was ordered by PCP May, 2016.  At discharge, 02/02/2015, dose for aspirin was 81mg .  I confirmed that current med list at discharge listed aspirin 81mg  chewable tablet daily and Aspirin 325 mg not listed on current med list at discharge date of 02/02/2015.

## 2015-02-21 NOTE — Telephone Encounter (Signed)
ERROR

## 2015-03-13 ENCOUNTER — Encounter: Payer: Self-pay | Admitting: *Deleted

## 2015-03-13 ENCOUNTER — Other Ambulatory Visit: Payer: Self-pay | Admitting: Neurology

## 2015-03-13 MED ORDER — OXYCODONE-ACETAMINOPHEN 7.5-325 MG PO TABS: 1.0000 | ORAL_TABLET | Freq: Three times a day (TID) | ORAL | Status: AC | PRN

## 2015-03-13 NOTE — Telephone Encounter (Signed)
Patient called requesting refill for oxyCODONE-acetaminophen (PERCOCET) 7.5-325 MG per tablet . Patient advised RX will be ready within 24 hours unless otherwise informed by RN.

## 2015-03-13 NOTE — Progress Notes (Signed)
Oxycodone rx. up front [facility] 

## 2015-03-13 NOTE — Telephone Encounter (Signed)
Request entered, forwarded to provider for approval.  

## 2015-09-27 DEATH — deceased

## 2016-08-09 IMAGING — CR DG CHEST 1V PORT
1 series · 1 of 1 positions shown · non-contrast
Comparison: 01/26/2015

CLINICAL DATA: Tachycardia

EXAM:
PORTABLE CHEST - 1 VIEW

[AP]
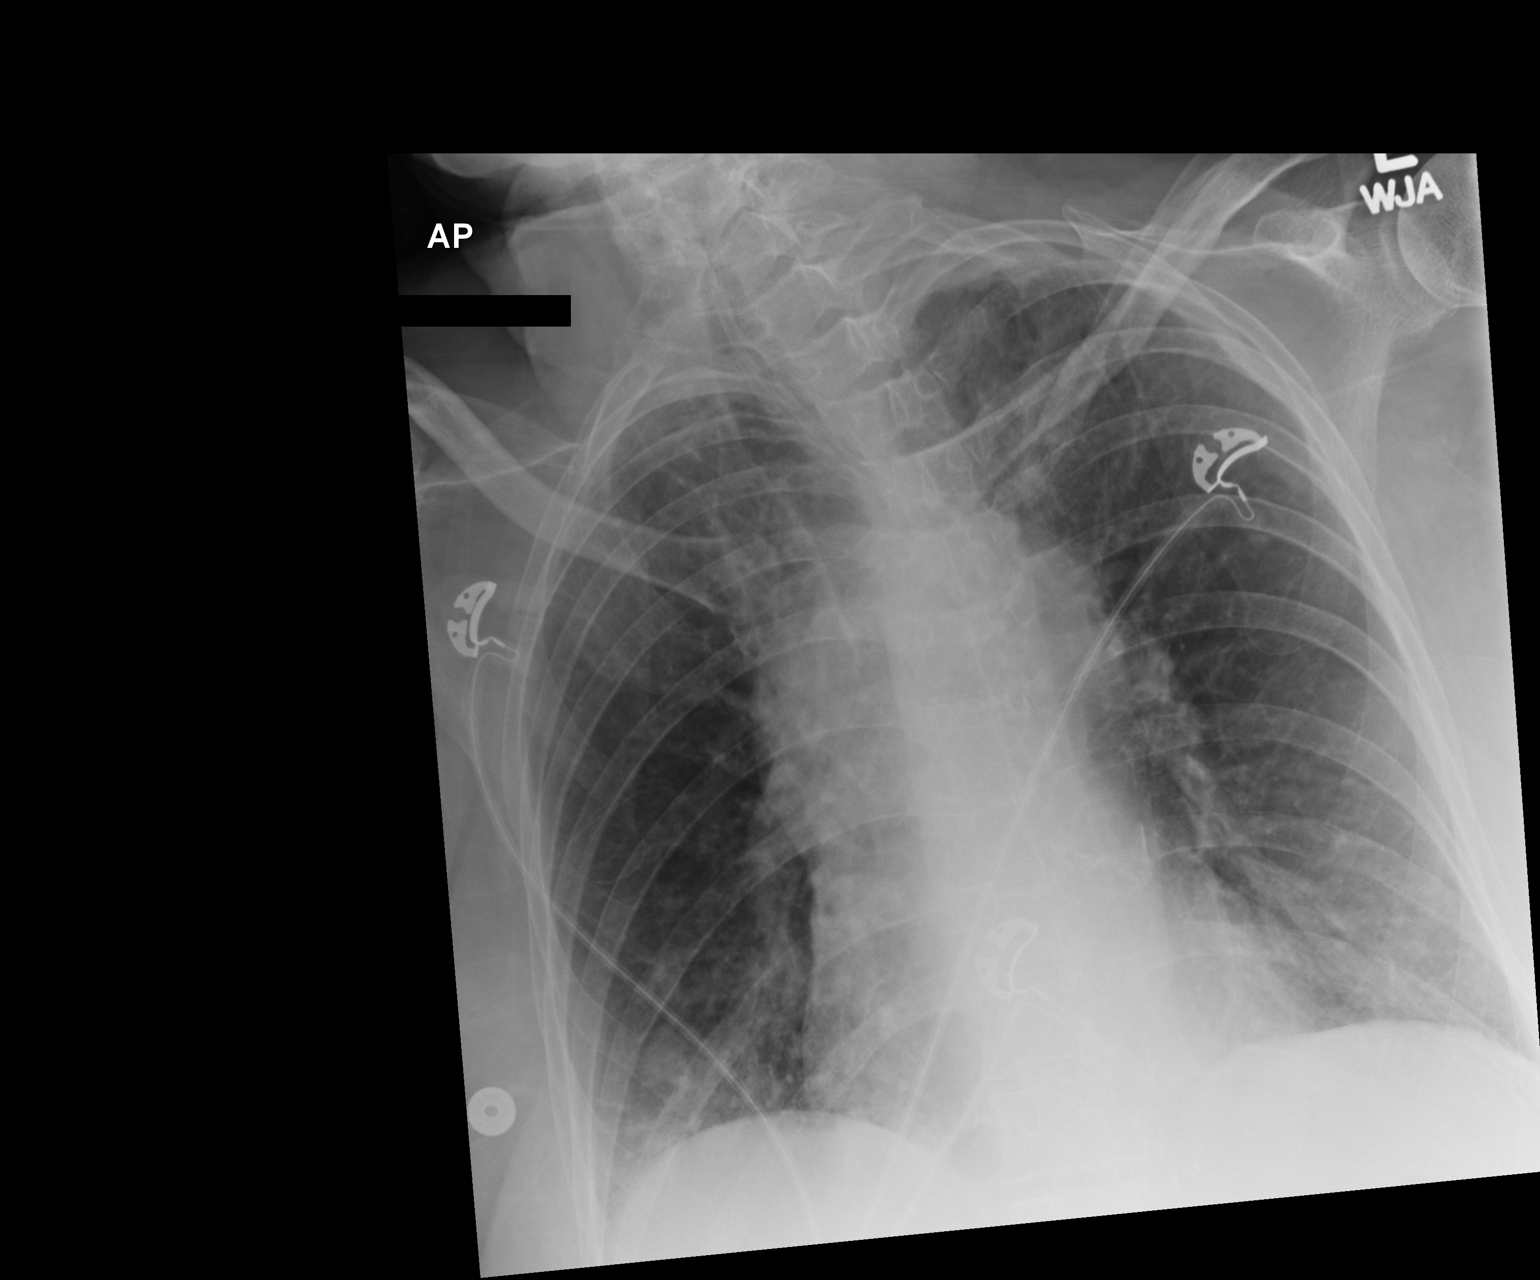

[1 of 1 positions shown; findings below may reference images not displayed]

FINDINGS: There is mild chronic appearing interstitial coarsening. There is no
airspace consolidation. There is no large effusion. There is no
pneumothorax. Heart size is normal unchanged.
IMPRESSION: No acute cardiopulmonary findings.
